# Patient Record
Sex: Male | Born: 1955 | Race: Black or African American | Hispanic: No | Marital: Single | Smoking: Current every day smoker
Health system: Southern US, Community
[De-identification: ages and names within clinical notes are randomized; demographics above are authoritative.]

## PROBLEM LIST (undated history)

## (undated) DIAGNOSIS — I10 Essential (primary) hypertension: Secondary | ICD-10-CM

## (undated) DIAGNOSIS — I639 Cerebral infarction, unspecified: Secondary | ICD-10-CM

## (undated) DIAGNOSIS — F1010 Alcohol abuse, uncomplicated: Secondary | ICD-10-CM

---

## 2006-06-17 ENCOUNTER — Ambulatory Visit: Payer: Self-pay | Admitting: Family Medicine

## 2012-05-04 ENCOUNTER — Encounter (HOSPITAL_COMMUNITY): Payer: Self-pay | Admitting: *Deleted

## 2012-05-04 ENCOUNTER — Emergency Department (HOSPITAL_COMMUNITY)
Admission: EM | Admit: 2012-05-04 | Discharge: 2012-05-04 | Disposition: A | Discharge status: Home / Self Care | Payer: PRIVATE HEALTH INSURANCE | Attending: Emergency Medicine | Admitting: Emergency Medicine

## 2012-05-04 DIAGNOSIS — L723 Sebaceous cyst: Secondary | ICD-10-CM | POA: Insufficient documentation

## 2012-05-04 DIAGNOSIS — L0201 Cutaneous abscess of face: Secondary | ICD-10-CM | POA: Insufficient documentation

## 2012-05-04 DIAGNOSIS — F17200 Nicotine dependence, unspecified, uncomplicated: Secondary | ICD-10-CM | POA: Insufficient documentation

## 2012-05-04 DIAGNOSIS — L03211 Cellulitis of face: Secondary | ICD-10-CM | POA: Insufficient documentation

## 2012-05-04 MED ORDER — LIDOCAINE HCL (PF) 1 % IJ SOLN
INTRAMUSCULAR | Status: AC
  Administered 2012-05-04: 5 mL
  Filled 2012-05-04: qty 5

## 2012-05-04 NOTE — ED Provider Notes (Signed)
History     CSN: [id number]  Arrival date & time 05/04/12  1405   First MD Initiated Contact with Patient 05/04/12 1429      Chief Complaint  Patient presents with  . Abscess    (Consider location/radiation/quality/duration/timing/severity/associated sxs/prior treatment) HPI Comments: Pt notes swelling anterior to L ear over the past several days.  He also has a swollen area behind the L ear which he has had for many months.  The history is provided by the patient. No language interpreter was used.    History reviewed. No pertinent past medical history.  History reviewed. No pertinent past surgical history.  History reviewed. No pertinent family history.  History  Substance Use Topics  . Smoking status: Current Every Day Smoker  . Smokeless tobacco: Not on file  . Alcohol Use: Yes      Review of Systems  Constitutional: Negative for fever and chills.  HENT: Negative for facial swelling.   Skin:       Abscess   All other systems reviewed and are negative.    Allergies  Review of patient's allergies indicates no known allergies.  Home Medications  No current outpatient prescriptions on file.  BP 189/93  Pulse 71  Temp 98.1 F (36.7 C) (Oral)  Resp 18  Ht 6' 1.5" (1.867 m)  Wt 160 lb (72.576 kg)  BMI 20.82 kg/m2  SpO2 100%  Physical Exam  Nursing note and vitals reviewed. Constitutional: He is oriented to person, place, and time. He appears well-developed and well-nourished.  HENT:  Head: Normocephalic and atraumatic.    Eyes: EOM are normal.  Neck: Normal range of motion.  Cardiovascular: Normal rate, regular rhythm and intact distal pulses.   Pulmonary/Chest: Effort normal. No respiratory distress.  Abdominal: Soft. He exhibits no distension. There is no tenderness.  Musculoskeletal: Normal range of motion.  Neurological: He is alert and oriented to person, place, and time.  Skin: Skin is warm and dry.  Psychiatric: He has a normal mood and  affect. Judgment normal.    ED Course  INCISION AND DRAINAGE Date/Time: 05/04/2012 3:40 PM Performed by: Evalina Field Authorized by: Evalina Field Consent: Verbal consent obtained. Written consent not obtained. Risks and benefits: risks, benefits and alternatives were discussed Consent given by: patient Patient understanding: patient states understanding of the procedure being performed Patient consent: the patient's understanding of the procedure matches consent given Site marked: the operative site was not marked Imaging studies: imaging studies not available Patient identity confirmed: verbally with patient Time out: Immediately prior to procedure a "time out" was called to verify the correct patient, procedure, equipment, support staff and site/side marked as required. Type: abscess Anesthesia: local infiltration Local anesthetic: lidocaine 1% without epinephrine Anesthetic total: 4 ml Patient sedated: no Scalpel size: 11 Needle gauge: 25 ga. Incision type: single straight Complexity: simple Drainage: purulent Drainage amount: copious Wound treatment: wound left open Patient tolerance: Patient tolerated the procedure well with no immediate complications. Comments: Procedure above was for the pre-auricular abscess. The post-auricular lesion when incised revealed a large quantity of sebaceous material.   (including critical care time)  Labs Reviewed - No data to display No results found.   1. Facial abscess   2. Sebaceous cyst       MDM  Have elected to not pack either lesion with iodoform or prescribe abx.  Pt told to apply  Heat several times daily.  Return to the ED if any problems.  Evalina Field, [place] 05/04/12 [phone]

## 2012-05-04 NOTE — ED Notes (Signed)
Abscess  In front of lt ear and to scalp.

## 2012-05-04 NOTE — ED Provider Notes (Signed)
Medical screening examination/treatment/procedure(s) were performed by non-physician practitioner and as supervising physician I was immediately available for consultation/collaboration.   Shelda Jakes, MD 05/04/12 [phone]

## 2018-03-17 ENCOUNTER — Inpatient Hospital Stay (HOSPITAL_COMMUNITY): Payer: PRIVATE HEALTH INSURANCE

## 2018-03-17 ENCOUNTER — Other Ambulatory Visit: Payer: Self-pay

## 2018-03-17 ENCOUNTER — Inpatient Hospital Stay (HOSPITAL_COMMUNITY)
Admission: EM | Admit: 2018-03-17 | Discharge: 2018-05-04 | DRG: 064 | Disposition: A | Discharge status: Skilled Nursing Facility | Payer: PRIVATE HEALTH INSURANCE | Attending: Internal Medicine | Admitting: Internal Medicine

## 2018-03-17 ENCOUNTER — Encounter (HOSPITAL_COMMUNITY): Payer: Self-pay | Admitting: *Deleted

## 2018-03-17 ENCOUNTER — Emergency Department (HOSPITAL_COMMUNITY): Payer: PRIVATE HEALTH INSURANCE

## 2018-03-17 DIAGNOSIS — R000 Tachycardia, unspecified: Secondary | ICD-10-CM | POA: Diagnosis not present

## 2018-03-17 DIAGNOSIS — G936 Cerebral edema: Secondary | ICD-10-CM | POA: Diagnosis present

## 2018-03-17 DIAGNOSIS — Z6820 Body mass index (BMI) 20.0-20.9, adult: Secondary | ICD-10-CM | POA: Diagnosis not present

## 2018-03-17 DIAGNOSIS — Z720 Tobacco use: Secondary | ICD-10-CM | POA: Diagnosis not present

## 2018-03-17 DIAGNOSIS — F1020 Alcohol dependence, uncomplicated: Secondary | ICD-10-CM | POA: Diagnosis present

## 2018-03-17 DIAGNOSIS — F17210 Nicotine dependence, cigarettes, uncomplicated: Secondary | ICD-10-CM | POA: Diagnosis present

## 2018-03-17 DIAGNOSIS — Y900 Blood alcohol level of less than 20 mg/100 ml: Secondary | ICD-10-CM | POA: Diagnosis present

## 2018-03-17 DIAGNOSIS — G92 Toxic encephalopathy: Secondary | ICD-10-CM | POA: Diagnosis not present

## 2018-03-17 DIAGNOSIS — F1010 Alcohol abuse, uncomplicated: Secondary | ICD-10-CM | POA: Diagnosis present

## 2018-03-17 DIAGNOSIS — Z23 Encounter for immunization: Secondary | ICD-10-CM | POA: Diagnosis not present

## 2018-03-17 DIAGNOSIS — I63511 Cerebral infarction due to unspecified occlusion or stenosis of right middle cerebral artery: Principal | ICD-10-CM

## 2018-03-17 DIAGNOSIS — I639 Cerebral infarction, unspecified: Secondary | ICD-10-CM | POA: Diagnosis present

## 2018-03-17 DIAGNOSIS — R4701 Aphasia: Secondary | ICD-10-CM | POA: Diagnosis present

## 2018-03-17 DIAGNOSIS — T424X5A Adverse effect of benzodiazepines, initial encounter: Secondary | ICD-10-CM | POA: Diagnosis not present

## 2018-03-17 DIAGNOSIS — Y92230 Patient room in hospital as the place of occurrence of the external cause: Secondary | ICD-10-CM | POA: Diagnosis not present

## 2018-03-17 DIAGNOSIS — F1290 Cannabis use, unspecified, uncomplicated: Secondary | ICD-10-CM | POA: Diagnosis present

## 2018-03-17 DIAGNOSIS — R29713 NIHSS score 13: Secondary | ICD-10-CM | POA: Diagnosis present

## 2018-03-17 DIAGNOSIS — E440 Moderate protein-calorie malnutrition: Secondary | ICD-10-CM

## 2018-03-17 DIAGNOSIS — D72829 Elevated white blood cell count, unspecified: Secondary | ICD-10-CM | POA: Diagnosis present

## 2018-03-17 DIAGNOSIS — G9341 Metabolic encephalopathy: Secondary | ICD-10-CM

## 2018-03-17 DIAGNOSIS — I674 Hypertensive encephalopathy: Secondary | ICD-10-CM | POA: Diagnosis present

## 2018-03-17 DIAGNOSIS — R040 Epistaxis: Secondary | ICD-10-CM | POA: Diagnosis not present

## 2018-03-17 DIAGNOSIS — I10 Essential (primary) hypertension: Secondary | ICD-10-CM | POA: Diagnosis present

## 2018-03-17 DIAGNOSIS — E43 Unspecified severe protein-calorie malnutrition: Secondary | ICD-10-CM | POA: Diagnosis present

## 2018-03-17 DIAGNOSIS — E512 Wernicke's encephalopathy: Secondary | ICD-10-CM

## 2018-03-17 HISTORY — DX: Alcohol abuse, uncomplicated: F10.10

## 2018-03-17 HISTORY — DX: Cerebral infarction, unspecified: I63.9

## 2018-03-17 HISTORY — DX: Essential (primary) hypertension: I10

## 2018-03-17 LAB — COMPREHENSIVE METABOLIC PANEL
ALT: 21 U/L (ref 0–44)
AST: 33 U/L (ref 15–41)
Albumin: 4.5 g/dL (ref 3.5–5.0)
Alkaline Phosphatase: 93 U/L (ref 38–126)
Anion gap: 12 (ref 5–15)
BILIRUBIN TOTAL: 1.1 mg/dL (ref 0.3–1.2)
BUN: 17 mg/dL (ref 6–20)
CHLORIDE: 103 mmol/L (ref 98–111)
CO2: 23 mmol/L (ref 22–32)
CREATININE: 0.97 mg/dL (ref 0.61–1.24)
Calcium: 9.6 mg/dL (ref 8.9–10.3)
Glucose, Bld: 101 mg/dL — ABNORMAL HIGH (ref 70–99)
POTASSIUM: 3.7 mmol/L (ref 3.5–5.1)
Sodium: 138 mmol/L (ref 135–145)
Total Protein: 8.4 g/dL — ABNORMAL HIGH (ref 6.5–8.1)

## 2018-03-17 LAB — CBC WITH DIFFERENTIAL/PLATELET
Basophils Absolute: 0.1 10*3/uL (ref 0.0–0.1)
Basophils Relative: 0 %
EOS PCT: 0 %
Eosinophils Absolute: 0 10*3/uL (ref 0.0–0.7)
HEMATOCRIT: 49.4 % (ref 39.0–52.0)
Hemoglobin: 16.5 g/dL (ref 13.0–17.0)
LYMPHS PCT: 17 %
Lymphs Abs: 2 10*3/uL (ref 0.7–4.0)
MCH: 32 pg (ref 26.0–34.0)
MCHC: 33.4 g/dL (ref 30.0–36.0)
MCV: 95.9 fL (ref 78.0–100.0)
MONO ABS: 1.1 10*3/uL — AB (ref 0.1–1.0)
MONOS PCT: 9 %
NEUTROS ABS: 8.7 10*3/uL — AB (ref 1.7–7.7)
Neutrophils Relative %: 74 %
PLATELETS: 273 10*3/uL (ref 150–400)
RBC: 5.15 MIL/uL (ref 4.22–5.81)
RDW: 12.9 % (ref 11.5–15.5)
WBC: 11.9 10*3/uL — ABNORMAL HIGH (ref 4.0–10.5)

## 2018-03-17 LAB — AMMONIA: AMMONIA: 13 umol/L (ref 9–35)

## 2018-03-17 LAB — ETHANOL

## 2018-03-17 LAB — MAGNESIUM: Magnesium: 2.2 mg/dL (ref 1.7–2.4)

## 2018-03-17 LAB — PHOSPHORUS: PHOSPHORUS: 3.9 mg/dL (ref 2.5–4.6)

## 2018-03-17 MED ORDER — LORAZEPAM 2 MG/ML IJ SOLN
0.5000 mg | Freq: Once | INTRAMUSCULAR | Status: AC
  Administered 2018-03-17: 0.5 mg via INTRAVENOUS
  Filled 2018-03-17: qty 1

## 2018-03-17 MED ORDER — ACETAMINOPHEN 650 MG RE SUPP: 650.0000 mg | RECTAL | Status: DC | PRN

## 2018-03-17 MED ORDER — HEPARIN SODIUM (PORCINE) 5000 UNIT/ML IJ SOLN: 5000.0000 [IU] | Freq: Three times a day (TID) | INTRAMUSCULAR | Status: DC

## 2018-03-17 MED ORDER — THIAMINE HCL 100 MG/ML IJ SOLN
500.0000 mg | Freq: Once | INTRAVENOUS | Status: AC
  Administered 2018-03-17: 500 mg via INTRAVENOUS
  Filled 2018-03-17: qty 5

## 2018-03-17 MED ORDER — THIAMINE HCL 100 MG/ML IJ SOLN
INTRAMUSCULAR | Status: AC
  Filled 2018-03-17: qty 6

## 2018-03-17 MED ORDER — STROKE: EARLY STAGES OF RECOVERY BOOK
Freq: Once | Status: AC
  Administered 2018-03-17: 22:00:00
  Filled 2018-03-17 (×2): qty 1

## 2018-03-17 MED ORDER — ACETAMINOPHEN 160 MG/5ML PO SOLN: 650.0000 mg | ORAL | Status: DC | PRN

## 2018-03-17 MED ORDER — FOLIC ACID 1 MG PO TABS
1.0000 mg | ORAL_TABLET | Freq: Every day | ORAL | Status: DC
  Administered 2018-03-18 – 2018-05-04 (×48): 1 mg via ORAL
  Filled 2018-03-17 (×48): qty 1

## 2018-03-17 MED ORDER — THIAMINE HCL 100 MG/ML IJ SOLN
100.0000 mg | Freq: Once | INTRAMUSCULAR | Status: AC
Start: 2018-03-17 — End: 2018-03-17
  Administered 2018-03-17: 100 mg via INTRAVENOUS
  Filled 2018-03-17: qty 2

## 2018-03-17 MED ORDER — SENNOSIDES-DOCUSATE SODIUM 8.6-50 MG PO TABS
1.0000 | ORAL_TABLET | Freq: Every evening | ORAL | Status: DC | PRN
  Filled 2018-03-17: qty 1

## 2018-03-17 MED ORDER — HEPARIN SODIUM (PORCINE) 5000 UNIT/ML IJ SOLN
5000.0000 [IU] | Freq: Three times a day (TID) | INTRAMUSCULAR | Status: DC
  Administered 2018-03-17 – 2018-04-23 (×93): 5000 [IU] via SUBCUTANEOUS
  Filled 2018-03-17 (×92): qty 1

## 2018-03-17 MED ORDER — LORAZEPAM 1 MG PO TABS: 1.0000 mg | ORAL_TABLET | Freq: Four times a day (QID) | ORAL | Status: AC | PRN

## 2018-03-17 MED ORDER — ACETAMINOPHEN 325 MG PO TABS
650.0000 mg | ORAL_TABLET | ORAL | Status: DC | PRN
  Administered 2018-04-14: 650 mg via ORAL
  Filled 2018-03-17: qty 2

## 2018-03-17 MED ORDER — ADULT MULTIVITAMIN W/MINERALS CH
1.0000 | ORAL_TABLET | Freq: Every day | ORAL | Status: DC
  Administered 2018-03-18 – 2018-04-30 (×44): 1 via ORAL
  Filled 2018-03-17 (×44): qty 1

## 2018-03-17 MED ORDER — LORAZEPAM 2 MG/ML IJ SOLN
0.0000 mg | Freq: Four times a day (QID) | INTRAMUSCULAR | Status: DC
  Administered 2018-03-19: 2 mg via INTRAVENOUS
  Filled 2018-03-17: qty 1

## 2018-03-17 MED ORDER — ASPIRIN 300 MG RE SUPP
300.0000 mg | Freq: Once | RECTAL | Status: AC
  Administered 2018-03-17: 300 mg via RECTAL
  Filled 2018-03-17: qty 1

## 2018-03-17 MED ORDER — THIAMINE HCL 100 MG/ML IJ SOLN: 500.0000 mg | Freq: Once | INTRAMUSCULAR | Status: DC

## 2018-03-17 MED ORDER — VITAMIN B-1 100 MG PO TABS: 100.0000 mg | ORAL_TABLET | Freq: Once | ORAL | Status: DC

## 2018-03-17 MED ORDER — ONDANSETRON HCL 4 MG PO TABS: 4.0000 mg | ORAL_TABLET | Freq: Four times a day (QID) | ORAL | Status: DC | PRN

## 2018-03-17 MED ORDER — ONDANSETRON HCL 4 MG/2ML IJ SOLN: 4.0000 mg | Freq: Four times a day (QID) | INTRAMUSCULAR | Status: DC | PRN

## 2018-03-17 MED ORDER — LORAZEPAM 2 MG/ML IJ SOLN
0.0000 mg | Freq: Two times a day (BID) | INTRAMUSCULAR | Status: DC
  Filled 2018-03-17: qty 1

## 2018-03-17 MED ORDER — LORAZEPAM 2 MG/ML IJ SOLN
1.0000 mg | Freq: Four times a day (QID) | INTRAMUSCULAR | Status: AC | PRN
  Administered 2018-03-20: 1 mg via INTRAVENOUS
  Filled 2018-03-17: qty 1

## 2018-03-17 MED ORDER — POTASSIUM CHLORIDE IN NACL 20-0.9 MEQ/L-% IV SOLN
INTRAVENOUS | Status: DC
  Administered 2018-03-17 – 2018-03-21 (×9): via INTRAVENOUS

## 2018-03-17 NOTE — ED Notes (Signed)
Attempted to get urine sample by using urinal. Pt attempted to use urinal but instead urinated all over the floor. Pt unable to follow directions and getting very agitated.

## 2018-03-17 NOTE — ED Provider Notes (Addendum)
[facility] EMERGENCY DEPARTMENT Provider Note   CSN: [id number] Arrival date & time: 03/17/18  1623     History   Chief Complaint Chief Complaint  Patient presents with  . Altered Mental Status    HPI Adam Koch is a 62 y.o. male.  HPI Patient brought in by EMS for altered mental status.  He is unable to contribute to history.  Level 5 caveat applies.  Per EMS patient was found walking in the street in his underwear today.  He was agitated on scene.  History of alcohol use.      History reviewed. No pertinent past medical history.  There are no active problems to display for this patient.   History reviewed. No pertinent surgical history.      Home Medications    Prior to Admission medications   Not on File    Family History No family history on file.  Social History Social History   Tobacco Use  . Smoking status: Current Every Day Smoker    Types: Cigarettes  . Smokeless tobacco: Never Used  Substance Use Topics  . Alcohol use: Not Currently    Comment: pt denies, EMS reports friends say he drinks regularly  . Drug use: No     Allergies   Patient has no known allergies.   Review of Systems Review of Systems  Unable to perform ROS: Mental status change     Physical Exam Updated Vital Signs Ht 6\' 1"  (1.854 m)   Wt 72 kg   BMI 20.94 kg/m   Physical Exam  Constitutional: He appears well-developed and well-nourished. No distress.  HENT:  Head: Normocephalic and atraumatic.  Mouth/Throat: Oropharynx is clear and moist.  No obvious head trauma.  No intraoral trauma.  Eyes: Pupils are equal, round, and reactive to light. EOM are normal.  Neck: Normal range of motion. Neck supple.  No meningismus.  No posterior midline cervical tenderness to palpation.  Cardiovascular: Regular rhythm.  Tachycardia   Pulmonary/Chest: Effort normal and breath sounds normal. No stridor. No respiratory distress. He has no wheezes. He has no rales. He  exhibits no tenderness.  Abdominal: Soft. Bowel sounds are normal. There is no tenderness. There is no rebound and no guarding.  Musculoskeletal: Normal range of motion. He exhibits no edema or tenderness.  Neurological: He is alert.  Repetitive.  Oriented only to self.  5/5 motor in all extremities.  Sensation light touch grossly intact.  No tremor identified.  Skin: Skin is warm and dry. Capillary refill takes less than 2 seconds. No rash noted. No erythema.  Psychiatric:  Patient is confused and repetitive.  Mildly agitated but amenable to verbal consolation  Nursing note and vitals reviewed.    ED Treatments / Results  Labs (all labs ordered are listed, but only abnormal results are displayed) Labs Reviewed - No data to display  EKG None ED ECG REPORT   Date: 05/03/2018  Rate: 71  Rhythm: normal sinus rhythm  QRS Axis: normal  Intervals: normal  ST/T Wave abnormalities: nonspecific T wave changes  Conduction Disutrbances:none  Narrative Interpretation:   Old EKG Reviewed: none available  I have personally reviewed the EKG tracing and agree with the computerized printout as noted.  Radiology No results found.  Procedures Procedures (including critical care time)  Medications Ordered in ED Medications  LORazepam (ATIVAN) injection 0.5 mg (has no administration in time range)  thiamine (B-1) injection 100 mg (has no administration in time range)  Initial Impression / Assessment and Plan / ED Course  I have reviewed the triage vital signs and the nursing notes.  Pertinent labs & imaging results that were available during my care of the patient were reviewed by me and considered in my medical decision making (see chart for details).     Tachycardia and hypertension.  Question alcohol withdrawal versus acute intoxication.  Given history of prior alcohol use, also consider Warnicke Korsakoff.  Will give low-dose Ativan and thiamine while awaiting work-up  results.  Final Clinical Impressions(s) / ED Diagnoses   Final diagnoses:  None    ED Discharge Orders    None       Loren Racer, MD 03/20/18 1516    Loren Racer, MD 05/03/18 1756

## 2018-03-17 NOTE — ED Notes (Signed)
Patient transported to CT 

## 2018-03-17 NOTE — H&P (Signed)
History and Physical    Adam Koch ZOX:[id number] DOB: December 28, 1955 DOA: 03/17/2018  PCP: Patient, No Pcp Per   Patient coming from: Home.  I have personally briefly reviewed patient's old medical records in [facility]  Chief Complaint: AMS.  HPI: Adam Koch is a 62 y.o. male with medical history significant of alcohol abuse, tobacco use, hypertension who is coming to the emergency department due to being found by EMS and RDP wandering on the streets confused.  He is unable to provide further history.  A friend of the patient talked to EMS and stated that he drinks regularly.  He smokes daily an unknown amount of cigarettes.  He is only oriented to name and does not follow most simple commands.  ED Course: Initial vital signs pulse 66, respirations 16, blood pressure 197/114 mmHg and O2 sat 99%.  He was given half milligram lorazepam IVP x1 and thiamine 100 mg IVP x1.  His white count was 11.9 with 74% neutrophils, 17% lymphocytes and 9% monocytes.  Hemoglobin 16.5 g/dL.  Platelets are 273.  His CMP shows a glucose of 101 mg/dL and total protein of 8.4 g/dL.  All other values are within normal limits.  Ammonia level was 13 mol/L.  Alcohol level was less than 10 mg/dL.  Imaging: CT head without contrast shows a moderate area of edema/acute to subacute infarct in the right parietal lobe without evidence for hemorrhage or midline shift.  There is also suspected chronic infarct within the left parietal lobe.  There is chronic infarcts within the bilateral basal ganglia with small vessel ischemic changes of the white matter.  Please see images and full radiology report for further detail.  Review of Systems: Unable to obtain.   Past Medical History:  Diagnosis Date  . Alcohol abuse   . Hypertension   . Stroke [facility])     History reviewed. No pertinent surgical history.   reports that he has been smoking cigarettes. He has never used smokeless tobacco. He reports that he drank  alcohol. He reports that he does not use drugs.  No Known Allergies  History reviewed. Unable to obtain from the patient.   Prior to Admission medications   Not on File    Physical Exam: Vitals:   03/17/18 1700 03/17/18 1745 03/17/18 1800 03/17/18 1830  BP: (!) 193/107 (!) 184/97 (!) 167/93 (!) 180/101  Pulse: 66 60 63 61  Resp: (!) 22 18 20 14   SpO2: 99% 100% 100% 100%  Weight:      Height:        Constitutional: NAD, calm, comfortable Eyes: PERRL, lids and conjunctivae normal ENMT: Mucous membranes are moist. Posterior pharynx clear of any exudate or lesions. Neck: normal, supple, no masses, no thyromegaly Respiratory: clear to auscultation bilaterally, no wheezing, no crackles. Normal respiratory effort. No accessory muscle use.  Cardiovascular: Regular rate and rhythm, no murmurs / rubs / gallops. No extremity edema. 2+ pedal pulses. No carotid bruits.  Abdomen: Soft, no tenderness, no masses palpated. No hepatosplenomegaly. Bowel sounds positive.  Musculoskeletal: no clubbing / cyanosis.  Good ROM, no contractures. Normal muscle tone.  Skin: no rashes, lesions, ulcers  Neurologic: Seems to be grossly nonfocal.  However the patient does not follow simple commands or assist in his neurological examination in any way.  Unable to fully evaluate. Psychiatric:  Alert and oriented x 1.  Disoriented to place, time, date and situation.  Labs on Admission: I have personally reviewed following labs and imaging studies  CBC: Recent Labs  Lab 03/17/18 1738  WBC 11.9*  NEUTROABS 8.7*  HGB 16.5  HCT 49.4  MCV 95.9  PLT 273   Basic Metabolic Panel: Recent Labs  Lab 03/17/18 1738  NA 138  K 3.7  CL 103  CO2 23  GLUCOSE 101*  BUN 17  CREATININE 0.97  CALCIUM 9.6   GFR: Estimated Creatinine Clearance: 82.5 mL/min (by C-G formula based on SCr of 0.97 mg/dL). Liver Function Tests: Recent Labs  Lab 03/17/18 1738  AST 33  ALT 21  ALKPHOS 93  BILITOT 1.1  PROT 8.4*    ALBUMIN 4.5   No results for input(s): LIPASE, AMYLASE in the last 168 hours. Recent Labs  Lab 03/17/18 1738  AMMONIA 13   Coagulation Profile: No results for input(s): INR, PROTIME in the last 168 hours. Cardiac Enzymes: No results for input(s): CKTOTAL, CKMB, CKMBINDEX, TROPONINI in the last 168 hours. BNP (last 3 results) No results for input(s): PROBNP in the last 8760 hours. HbA1C: No results for input(s): HGBA1C in the last 72 hours. CBG: No results for input(s): GLUCAP in the last 168 hours. Lipid Profile: No results for input(s): CHOL, HDL, LDLCALC, TRIG, CHOLHDL, LDLDIRECT in the last 72 hours. Thyroid Function Tests: No results for input(s): TSH, T4TOTAL, FREET4, T3FREE, THYROIDAB in the last 72 hours. Anemia Panel: No results for input(s): VITAMINB12, FOLATE, FERRITIN, TIBC, IRON, RETICCTPCT in the last 72 hours. Urine analysis: No results found for: COLORURINE, APPEARANCEUR, LABSPEC, PHURINE, GLUCOSEU, HGBUR, BILIRUBINUR, KETONESUR, PROTEINUR, UROBILINOGEN, NITRITE, LEUKOCYTESUR  Radiological Exams on Admission: Ct Head Wo Contrast  Result Date: 03/17/2018 CLINICAL DATA:  Altered LOC EXAM: CT HEAD WITHOUT CONTRAST TECHNIQUE: Contiguous axial images were obtained from the base of the skull through the vertex without intravenous contrast. COMPARISON:  None. FINDINGS: Brain: No hemorrhage or intracranial mass. Low-density edema within the right parietal lobe consistent with infarct. Low attenuation within the left parietal lobe with associated mild volume loss, suggesting prior infarct. Chronic appearing infarcts within the bilateral basal ganglia. Moderate small vessel ischemic change of the white matter. Nonenlarged ventricles. Vascular: No hyperdense vessels.  Carotid vascular calcification Skull: No fracture Sinuses/Orbits: Old appearing blowout fractures of the medial walls of both orbits. Old appearing nasal bone fracture. No acute abnormality Other: None IMPRESSION: 1.  Moderate area of edema/acute to subacute infarct in the right parietal lobe without evidence for hemorrhage or midline shift. 2. Suspected chronic infarct within the left parietal lobe. Chronic infarcts within the bilateral basal ganglia with small vessel ischemic changes of the white matter. Electronically Signed   By: Jasmine Pang M.D.   On: 03/17/2018 19:26    EKG: Independently reviewed.  Vent. rate 71 BPM PR interval * ms QRS duration 80 ms QT/QTc 401/436 ms P-R-T axes 75 73 64 Sinus rhythm Consider left ventricular hypertrophy Anterior Q waves, possibly due to LVH ST elevation, consider inferior injury  Assessment/Plan Principal Problem:   Acute CVA (cerebrovascular accident) (HCC) Observation/Telemetry. Frequent neuro checks. PT/OT/SLP evaluation. Check fasting lipids. Check hemoglobin A1c. Check carotid Dopplers and echocardiogram. Check MR MRA to brain. Antiplatelet therapy (aspirin p.o. or PR close). Consult neurology in a.m.  Active Problems:   Alcohol abuse Responded only to 0.5 mg of lorazepam and IVP. Given mental status change will give high-dose thiamine. Continue daily thiamine supplementation. Magnesium sulfate, multivitamin and folic acid. CIWA protocol ordered in case the patient develops DTs.    Hypertension Not on medical therapy with no. In any case, allow permissive hypertension  due to CVA.    Tobacco use Nicotine replacement therapy as needed.    Leukocytosis Afebrile at this time. Awaiting for UA results. Does not seem to have infectious process. Follow WBC in the morning.   DVT prophylaxis: Heparin SQ. Code Status: Full code. Family Communication: None at bedside. Disposition Plan: Admit for CVA work-up. Consults called: Routine neurology consult. Admission status: Inpatient/telemetry.   Bobette Mo MD [facility] Pager [phone].  If 7PM-7AM, please contact night-coverage [email or web address] Password  [facility]  03/17/2018, 8:48 PM

## 2018-03-17 NOTE — ED Triage Notes (Signed)
Pt was found walking out in the streets in his underwear today. PD and EMS was called to assess pt. Pt is alert and oriented to name only. Pt unaware of his own DOB. EMS reports pt drinks alcohol but unsure if he has drank any today. Pt denies drinking any alcohol. Pt agitated.

## 2018-03-17 NOTE — ED Notes (Signed)
Pt refuses to follow commands but is strong in all extremities. Pt hooked back up to monitor. Pt is alert to name only

## 2018-03-18 ENCOUNTER — Inpatient Hospital Stay (HOSPITAL_COMMUNITY): Payer: PRIVATE HEALTH INSURANCE

## 2018-03-18 ENCOUNTER — Other Ambulatory Visit: Payer: Self-pay

## 2018-03-18 ENCOUNTER — Encounter (HOSPITAL_COMMUNITY): Payer: Self-pay | Admitting: *Deleted

## 2018-03-18 DIAGNOSIS — I63511 Cerebral infarction due to unspecified occlusion or stenosis of right middle cerebral artery: Secondary | ICD-10-CM

## 2018-03-18 DIAGNOSIS — G9341 Metabolic encephalopathy: Secondary | ICD-10-CM

## 2018-03-18 DIAGNOSIS — I10 Essential (primary) hypertension: Secondary | ICD-10-CM

## 2018-03-18 DIAGNOSIS — Z720 Tobacco use: Secondary | ICD-10-CM

## 2018-03-18 DIAGNOSIS — F1010 Alcohol abuse, uncomplicated: Secondary | ICD-10-CM

## 2018-03-18 LAB — URINALYSIS, COMPLETE (UACMP) WITH MICROSCOPIC
BILIRUBIN URINE: NEGATIVE
Bacteria, UA: NONE SEEN
GLUCOSE, UA: NEGATIVE mg/dL
KETONES UR: 20 mg/dL — AB
LEUKOCYTES UA: NEGATIVE
NITRITE: NEGATIVE
PH: 5 (ref 5.0–8.0)
PROTEIN: NEGATIVE mg/dL
Specific Gravity, Urine: 1.018 (ref 1.005–1.030)

## 2018-03-18 LAB — BASIC METABOLIC PANEL
Anion gap: 11 (ref 5–15)
BUN: 16 mg/dL (ref 6–20)
CALCIUM: 9 mg/dL (ref 8.9–10.3)
CO2: 22 mmol/L (ref 22–32)
CREATININE: 0.78 mg/dL (ref 0.61–1.24)
Chloride: 106 mmol/L (ref 98–111)
GFR calc Af Amer: >60 mL/min (ref >60)
GLUCOSE: 67 mg/dL — AB (ref 70–99)
Potassium: 3.9 mmol/L (ref 3.5–5.1)
SODIUM: 139 mmol/L (ref 135–145)

## 2018-03-18 LAB — CBC
HCT: 48.2 % (ref 39.0–52.0)
Hemoglobin: 15.9 g/dL (ref 13.0–17.0)
MCH: 31.7 pg (ref 26.0–34.0)
MCHC: 33 g/dL (ref 30.0–36.0)
MCV: 96.2 fL (ref 78.0–100.0)
PLATELETS: 251 10*3/uL (ref 150–400)
RBC: 5.01 MIL/uL (ref 4.22–5.81)
RDW: 12.9 % (ref 11.5–15.5)
WBC: 9.7 10*3/uL (ref 4.0–10.5)

## 2018-03-18 LAB — LIPID PANEL
Cholesterol: 115 mg/dL (ref 0–200)
HDL: 40 mg/dL — AB (ref >40)
LDL Cholesterol: 68 mg/dL (ref 0–99)
TRIGLYCERIDES: 34 mg/dL (ref <150)
Total CHOL/HDL Ratio: 2.9 RATIO
VLDL: 7 mg/dL (ref 0–40)

## 2018-03-18 LAB — RAPID URINE DRUG SCREEN, HOSP PERFORMED
Amphetamines: NOT DETECTED
BARBITURATES: NOT DETECTED
Benzodiazepines: NOT DETECTED
COCAINE: NOT DETECTED
Opiates: NOT DETECTED
Tetrahydrocannabinol: POSITIVE — AB

## 2018-03-18 LAB — FOLATE: Folate: 11.5 ng/mL (ref >5.9)

## 2018-03-18 LAB — ECHOCARDIOGRAM COMPLETE
HEIGHTINCHES: 73 in
WEIGHTICAEL: 2105.83 [oz_av]

## 2018-03-18 LAB — HEMOGLOBIN A1C
Hgb A1c MFr Bld: 5.4 % (ref 4.8–5.6)
MEAN PLASMA GLUCOSE: 108.28 mg/dL

## 2018-03-18 LAB — TSH: TSH: 1.094 u[IU]/mL (ref 0.350–4.500)

## 2018-03-18 LAB — AMMONIA: AMMONIA: 21 umol/L (ref 9–35)

## 2018-03-18 LAB — VITAMIN B12: Vitamin B-12: 589 pg/mL (ref 180–914)

## 2018-03-18 MED ORDER — HYDRALAZINE HCL 20 MG/ML IJ SOLN: 10.0000 mg | Freq: Four times a day (QID) | INTRAMUSCULAR | Status: DC | PRN

## 2018-03-18 MED ORDER — ASPIRIN 325 MG PO TABS
325.0000 mg | ORAL_TABLET | Freq: Every day | ORAL | Status: DC
  Administered 2018-03-18 – 2018-05-04 (×48): 325 mg via ORAL
  Filled 2018-03-18 (×48): qty 1

## 2018-03-18 MED ORDER — THIAMINE HCL 100 MG/ML IJ SOLN
500.0000 mg | Freq: Three times a day (TID) | INTRAVENOUS | Status: AC
  Administered 2018-03-18 – 2018-03-20 (×6): 500 mg via INTRAVENOUS
  Filled 2018-03-18 (×6): qty 5

## 2018-03-18 MED ORDER — INFLUENZA VAC SPLIT QUAD 0.5 ML IM SUSY
0.5000 mL | PREFILLED_SYRINGE | INTRAMUSCULAR | Status: DC
  Filled 2018-03-18: qty 0.5

## 2018-03-18 MED ORDER — PNEUMOCOCCAL VAC POLYVALENT 25 MCG/0.5ML IJ INJ
0.5000 mL | INJECTION | INTRAMUSCULAR | Status: AC
  Administered 2018-03-19: 0.5 mL via INTRAMUSCULAR
  Filled 2018-03-18: qty 0.5

## 2018-03-18 NOTE — Progress Notes (Signed)
Able to assess grips on patient at this time, strength is even and moderate to strong at times.  Patient remains confused.  Have asked patient if he needed to use the BR multiple times - still does not answer appropriately.  Bladder scan reavles approx 50 cc although may be limited due to patient movement.  Pulse also running from high 40's - 50's.  MD paged and made aware - awaiting further instruction.

## 2018-03-18 NOTE — Progress Notes (Addendum)
OT Cancellation Note  Patient Details Name: Adam Koch MRN: [id number] DOB: 1956-06-14   Cancelled Treatment:    Reason Eval/Treat Not Completed: Other (comment). Attempted evaluation with PT this am. Pt limited due to cognition and very limited interaction with rehab staff. OT observing pt during PT evaluation, pt unable to provide history stating "I don't even know." PT able to glean some hx from family member on phone. Pt appears to have strength WFL, unable to formally assess due to limited ability to follow directions. OT also notes pt appears to have left neglect OR left visual field cut as he had difficulty with following directions when asked to go left and did not appear to see the pillow positioned at his left. Will continue to follow and evaluate when appropriate.    Ezra Sites, OTR/L  [phone] 03/18/2018, 11:56 AM

## 2018-03-18 NOTE — Progress Notes (Signed)
Held morning meds because patient failed swallow test. Waiting for further evaluation. Patient has not been cooperative with NIH evaluation. He seems confused at times, but alert +O x 4 at other times. Patient stated on the phone to his aunt that we are not feeding him, telling him anything, or helping him. I offered to speak with his aunt on the phone and he would not allow me to. Will continue to monitor and evaluate patient.

## 2018-03-18 NOTE — Plan of Care (Signed)
  Problem: Acute Rehab PT Goals(only PT should resolve) Goal: Pt Will Go Supine/Side To Sit Outcome: Progressing Flowsheets (Taken 03/18/2018 1223) Pt will go Supine/Side to Sit: with modified independence Goal: Patient Will Transfer Sit To/From Stand Outcome: Progressing Flowsheets (Taken 03/18/2018 1223) Patient will transfer sit to/from stand: with supervision Goal: Pt Will Transfer Bed To Chair/Chair To Bed Outcome: Progressing Flowsheets (Taken 03/18/2018 1223) Pt will Transfer Bed to Chair/Chair to Bed: with supervision Goal: Pt Will Ambulate Outcome: Progressing Flowsheets (Taken 03/18/2018 1223) Pt will Ambulate: with min guard assist; 50 feet; with cane   12:23 PM, 03/18/18 Ocie Bob, MPT Physical Therapist with [facility] [phone] office [phone] mobile phone

## 2018-03-18 NOTE — Progress Notes (Signed)
Patient's family members arrived. They said he was very confused and stated that he "wasn't the guy they knew". I asked them to encourage him as I performed his NIH assessment. He was very confused and did not follow instructions very well.

## 2018-03-18 NOTE — Progress Notes (Signed)
Patient sleeping on stomach, asked him to turn over on left side. He did once but rolled back on to stomach after a few seconds. I explained to patient here to do echo. Patient stated," It doesn't matter." And wouldn't turn on side for echo. Will attempt later.

## 2018-03-18 NOTE — Plan of Care (Signed)
Unable to educate patient due to confusion

## 2018-03-18 NOTE — Progress Notes (Signed)
Unable to complete neuro assessments and NIH.  Patient uncooperative and confused.  Does not answer appropriately and will not follow directions.  Attempted to assess grip/strength but patient refuses, but is able to turn and use strength to reposition self or pull away.  Can not answer when asked year or birthday - will answer yes or no to questions that are open ended.  Repeatedly states "I don't know" as well.  Patient lays to L side and keeps pulling covers over self.   Will continue to monitor.

## 2018-03-18 NOTE — Evaluation (Signed)
Physical Therapy Evaluation Patient Details Name: Adam Koch MRN: [id number] DOB: 03/10/1956 Today's Date: 03/18/2018   History of Present Illness   Adam Koch is a 62 y.o. male with medical history significant of alcohol abuse, tobacco use, hypertension who is coming to the emergency department due to being found by EMS and RDP wandering on the streets confused.  He is unable to provide further history.  A friend of the patient talked to EMS and stated that he drinks regularly.  He smokes daily an unknown amount of cigarettes.  He is only oriented to name and does not follow most simple commands.    Clinical Impression  Patient presents alert and requires encouragement to participate with therapy requires frequent repeated verbal/tactile cueing to complete functional tasks due to mild lethargy and confusion, fair/poor carryover for following most instructions, but able to ambulate into hallway with slow labored movement, had episode of stopping and standing requiring verbal/tactile cue to redirect to functional task, put back to bed after therapy with bed alarm activated.  Patient will benefit from continued physical therapy in hospital and recommended venue below to increase strength, balance, endurance for safe ADLs and gait.    Follow Up Recommendations SNF;Supervision/Assistance - 24 hour;Supervision for mobility/OOB    Equipment Recommendations  Cane    Recommendations for Other Services       Precautions / Restrictions Precautions Precautions: Fall Restrictions Weight Bearing Restrictions: No      Mobility  Bed Mobility Overal bed mobility: Needs Assistance Bed Mobility: Supine to Sit;Sit to Supine     Supine to sit: Min assist Sit to supine: Min assist   General bed mobility comments: slow labored movement with constant verbal/tactile cueing  Transfers Overall transfer level: Needs assistance Equipment used: 1 person hand held assist;None Transfers: Sit to/from  [employer] Sit to Stand: Min assist Stand pivot transfers: Min assist       General transfer comment: slow labored movement with repeated verbal/tactile cueing  Ambulation/Gait Ambulation/Gait assistance: Min assist Gait Distance (Feet): 30 Feet Assistive device: None;1 person hand held assist Gait Pattern/deviations: Decreased step length - right;Decreased step length - left;Decreased stride length Gait velocity: slow   General Gait Details: demonstrates slow slightly labored cadence with frequent leaning on side rails in hallway for support, frequent stopping and standing for up to 2-3 minutes requiring much redirection to complete tasks  Stairs            Wheelchair Mobility    Modified Rankin (Stroke Patients Only)       Balance Overall balance assessment: Needs assistance Sitting-balance support: Feet supported;No upper extremity supported Sitting balance-Leahy Scale: Good     Standing balance support: During functional activity;Single extremity supported Standing balance-Leahy Scale: Fair Standing balance comment: frequent leaning on side rail in hallway                             Pertinent Vitals/Pain Pain Assessment: No/denies pain    Home Living Family/patient expects to be discharged to:: Private residence Living Arrangements: Other relatives Available Help at Discharge: Family   Home Access: Level entry     Home Layout: One level Home Equipment: None Additional Comments: information obtained from patient's aunt on phone    Prior Function Level of Independence: Independent         Comments: community ambulator, does not drive     Hand Dominance        Extremity/Trunk Assessment  Upper Extremity Assessment Upper Extremity Assessment: Defer to OT evaluation    Lower Extremity Assessment Lower Extremity Assessment: Generalized weakness    Cervical / Trunk Assessment Cervical / Trunk Assessment:  Normal  Communication   Communication: Other (comment)(poor memory, poor carryover for following instructions)  Cognition Arousal/Alertness: Awake/alert Behavior During Therapy: Flat affect;Impulsive Overall Cognitive Status: Impaired/Different from baseline Area of Impairment: Orientation                 Orientation Level: Person             General Comments: Patient confused and frequent states he does not know what's going on      General Comments      Exercises     Assessment/Plan    PT Assessment Patient needs continued PT services  PT Problem List Decreased strength;Decreased activity tolerance;Decreased balance;Decreased mobility       PT Treatment Interventions Gait training;Stair training;Functional mobility training;Therapeutic activities;Therapeutic exercise;Patient/family education    PT Goals (Current goals can be found in the Care Plan section)  Acute Rehab PT Goals Patient Stated Goal: return home PT Goal Formulation: With patient Time For Goal Achievement: 04/01/18 Potential to Achieve Goals: Good    Frequency 7X/week   Barriers to discharge        Co-evaluation               AM-PAC PT "6 Clicks" Daily Activity  Outcome Measure Difficulty turning over in bed (including adjusting bedclothes, sheets and blankets)?: A Little Difficulty moving from lying on back to sitting on the side of the bed? : A Little Difficulty sitting down on and standing up from a chair with arms (e.g., wheelchair, bedside commode, etc,.)?: A Little Help needed moving to and from a bed to chair (including a wheelchair)?: A Little Help needed walking in hospital room?: A Little Help needed climbing 3-5 steps with a railing? : A Lot 6 Click Score: 17    End of Session Equipment Utilized During Treatment: Gait belt Activity Tolerance: Patient tolerated treatment well;Patient limited by fatigue Patient left: in bed;with call bell/phone within reach;with bed  alarm set Nurse Communication: Mobility status PT Visit Diagnosis: Unsteadiness on feet (R26.81);Other abnormalities of gait and mobility (R26.89);Muscle weakness (generalized) (M62.81)    Time: 1610-9604 PT Time Calculation (min) (ACUTE ONLY): 26 min   Charges:   PT Evaluation $PT Eval Moderate Complexity: 1 Mod PT Treatments $Therapeutic Activity: 23-37 mins        12:17 PM, 03/18/18 Ocie Bob, MPT Physical Therapist with [facility] [phone] office [phone] mobile phone

## 2018-03-18 NOTE — Plan of Care (Signed)
  Problem: SLP Cognition Goals Goal: Patient will demonstrate attention to functional Description Patient will demonstrate attention to functional task with Flowsheets (Taken 03/18/2018 1540) Patient will demonstrate _____ attention to functional task with ____: sustained; min assist Goal: Patient will utilize external memory aids Description Patient will utilize external memory aids to facilitate recall of information for improved safety with Flowsheets (Taken 03/18/2018 1540) Patient will utilize external memory aids to facilitate recall of ____ information for improved safety with ______: basic; mod assist   Problem: SLP Language Goals Goal: Patient will follow commands during a functional ADL with Flowsheets (Taken 03/18/2018 1540) Patient will follow ____ commands during a functional ADL with ____: 1 step; min assist   Thank you,  [name], CCC-SLP [phone]

## 2018-03-18 NOTE — Progress Notes (Signed)
Admission assessment unable to be completed at this time due to patients AMS

## 2018-03-18 NOTE — Progress Notes (Signed)
PROGRESS NOTE  Adam Koch ZOX:[id number] DOB: July 14, 1955 DOA: 03/17/2018 PCP: Patient, No Pcp Per  Brief History:  62 year old male with a history of alcohol abuse, tobacco use, hypertension presented with altered mental status.  Patient is unable to provide any history secondary to his encephalopathy.  Apparently, the patient was agitated and confused on the afternoon of 03/17/2018.  According to his girlfriend, the patient walked outside into the backyard in his underwear and began pulling out flowers.  Because of his agitation, she called the police who arrived and recommended EMS to bring the patient to the hospital.  According to the patient's girlfriend, the patient was " acting drunk" on 03/15/2018.  She states that she last saw him normal on 03/14/2018.  She states that he frequently goes out to drink alcohol after he gets paid, although she cannot elaborate how much and how often and the patient drinks.  However, she states that he has drank alcohol regularly for nearly 20 years.  The patient continues to smoke tobacco although the amount is unclear.  The been no reports of chest pain, shortness breath, vomiting, diarrhea, abdominal pain, chest pain.  In the emergency department, the patient was afebrile hemodynamically stable saturating 100% on room air.  He was only oriented to name.  Assessment/Plan: Acute metabolic encephalopathy -Multifactorial including Wernicke's encephalopathy, stroke, and possibly hypertensive encephalopathy -At baseline, the patient is alert and oriented x4 -Currently the patient is alert and oriented x1 -Serum B12 -TSH -Free T4 -Urinalysis -Folic acid -RPR -Ammonia -UDS  Acute right MCA infarct -Neurology Consult -PT/OT evaluation -Speech therapy eval -CT brain--Moderate area of edema/acute to subacute infarct in the right parietal lobe without evidence for hemorrhage or midline shift -MRI brain--acute infarct R-MCA territory with few other  smaller foci in the same territory; questionable infarct in the inferior cerebellum on the left -MRA brain--motion degraded, no hemodynamically significant stenosis -Carotid Duplex--negative for hemodynamically significant stenosis -Echo--pending -LDL--68 -HbA1C--pending -Antiplatelet--ASA 325 mg   Essential hypertension -Allowing for permissive hypertension -hydralazine prn SBP >220  Alcohol abuse -alcohol withdraw protocol  Tobacco abuse I have discussed tobacco cessation with the patient.  I have counseled the patient regarding the negative impacts of continued tobacco use including but not limited to lung cancer, COPD, and cardiovascular disease.  I have discussed alternatives to tobacco and modalities that may help facilitate tobacco cessation including but not limited to biofeedback, hypnosis, and medications.  Total time spent with tobacco counseling was 4 minutes.    Disposition Plan:   SNF in 1-2 days  Family Communication:   Cousin and niece updated at bedside 9/11  Consultants:  neurology  Code Status:  FULL   DVT Prophylaxis:  [place] Heparin    Procedures: As Listed in Progress Note Above  Antibiotics: None    Subjective: Patient is pleasantly confused at this time.  He denies any headache, chest pain, shortness breath, nausea, vomiting, diarrhea, abdominal pain.  There is no dysuria hematuria.  Objective: Vitals:   03/18/18 0607 03/18/18 0800 03/18/18 1000 03/18/18 1400  BP: (!) 175/93 (!) 150/84 (!) 160/94 (!) 209/83  Pulse: (!) 51 (!) 47 65 (!) 47  Resp: 16 18 18 18   Temp: 97.8 F (36.6 C) (!) 97.4 F (36.3 C) (!) 97.5 F (36.4 C) 97.7 F (36.5 C)  TempSrc: Oral Axillary Axillary Axillary  SpO2: 99% 100% 100% 100%  Weight:      Height:  Intake/Output Summary (Last 24 hours) at 03/18/2018 1530 Last data filed at 03/18/2018 1300 Gross per 24 hour  Intake 393.75 ml  Output 450 ml  Net -56.25 ml   Weight change:  Exam:   General:  Pt is  alert, follows commands appropriately, not in acute distress  HEENT: No icterus, No thrush, No neck mass, [place]/AT  Cardiovascular: RRR, S1/S2, no rubs, no gallops  Respiratory: CTA bilaterally, no wheezing, no crackles, no rhonchi  Abdomen: Soft/+BS, non tender, non distended, no guarding  Extremities: No edema, No lymphangitis, No petechiae, No rashes, no synovitis   Data Reviewed: I have personally reviewed following labs and imaging studies Basic Metabolic Panel: Recent Labs  Lab 03/17/18 1738 03/18/18 0503  NA 138 139  K 3.7 3.9  CL 103 106  CO2 23 22  GLUCOSE 101* 67*  BUN 17 16  CREATININE 0.97 0.78  CALCIUM 9.6 9.0  MG 2.2  --   PHOS 3.9  --    Liver Function Tests: Recent Labs  Lab 03/17/18 1738  AST 33  ALT 21  ALKPHOS 93  BILITOT 1.1  PROT 8.4*  ALBUMIN 4.5   No results for input(s): LIPASE, AMYLASE in the last 168 hours. Recent Labs  Lab 03/17/18 1738  AMMONIA 13   Coagulation Profile: No results for input(s): INR, PROTIME in the last 168 hours. CBC: Recent Labs  Lab 03/17/18 1738 03/18/18 0503  WBC 11.9* 9.7  NEUTROABS 8.7*  --   HGB 16.5 15.9  HCT 49.4 48.2  MCV 95.9 96.2  PLT 273 251   Cardiac Enzymes: No results for input(s): CKTOTAL, CKMB, CKMBINDEX, TROPONINI in the last 168 hours. BNP: Invalid input(s): POCBNP CBG: No results for input(s): GLUCAP in the last 168 hours. HbA1C: No results for input(s): HGBA1C in the last 72 hours. Urine analysis: No results found for: COLORURINE, APPEARANCEUR, LABSPEC, PHURINE, GLUCOSEU, HGBUR, BILIRUBINUR, KETONESUR, PROTEINUR, UROBILINOGEN, NITRITE, LEUKOCYTESUR Sepsis Labs: @LABRCNTIP (procalcitonin:4,lacticidven:4) )No results found for this or any previous visit (from the past 240 hour(s)).   Scheduled Meds: . folic acid  1 mg Oral Daily  . heparin  5,000 Units Subcutaneous Q8H  . [START ON 03/19/2018] Influenza vac split quadrivalent PF  0.5 mL Intramuscular Tomorrow-1000  . LORazepam   0-4 mg Intravenous Q6H   Followed by  . [START ON 03/19/2018] LORazepam  0-4 mg Intravenous Q12H  . multivitamin with minerals  1 tablet Oral Daily  . [START ON 03/19/2018] pneumococcal 23 valent vaccine  0.5 mL Intramuscular Tomorrow-1000  . thiamine  100 mg Oral Once   Continuous Infusions: . 0.9 % NaCl with KCl 20 mEq / L 125 mL/hr at 03/18/18 1156    Procedures/Studies: Ct Head Wo Contrast  Result Date: 03/17/2018 CLINICAL DATA:  Altered LOC EXAM: CT HEAD WITHOUT CONTRAST TECHNIQUE: Contiguous axial images were obtained from the base of the skull through the vertex without intravenous contrast. COMPARISON:  None. FINDINGS: Brain: No hemorrhage or intracranial mass. Low-density edema within the right parietal lobe consistent with infarct. Low attenuation within the left parietal lobe with associated mild volume loss, suggesting prior infarct. Chronic appearing infarcts within the bilateral basal ganglia. Moderate small vessel ischemic change of the white matter. Nonenlarged ventricles. Vascular: No hyperdense vessels.  Carotid vascular calcification Skull: No fracture Sinuses/Orbits: Old appearing blowout fractures of the medial walls of both orbits. Old appearing nasal bone fracture. No acute abnormality Other: None IMPRESSION: 1. Moderate area of edema/acute to subacute infarct in the right parietal lobe without evidence  for hemorrhage or midline shift. 2. Suspected chronic infarct within the left parietal lobe. Chronic infarcts within the bilateral basal ganglia with small vessel ischemic changes of the white matter. Electronically Signed   By: Jasmine Pang M.D.   On: 03/17/2018 19:26   Mr Brain Wo Contrast  Result Date: 03/18/2018 CLINICAL DATA:  Mental status changes.  Confusion. EXAM: MRI HEAD WITHOUT CONTRAST MRA HEAD WITHOUT CONTRAST TECHNIQUE: Multiplanar, multiecho pulse sequences of the brain and surrounding structures were obtained without intravenous contrast. Angiographic images of  the head were obtained using MRA technique without contrast. COMPARISON:  CT 03/17/2018 FINDINGS: MRI HEAD FINDINGS Brain: The study is markedly degraded by motion. There is a 6-7 cm region of acute infarction in the right middle cerebral artery territory affecting the posterior temporal lobe and parietal lobe. Few smaller patchy areas of acute infarction anterior to that at the frontoparietal junction region. Mild swelling but no sign of hemorrhage. Old basal ganglia infarctions bilaterally. No sign of hydrocephalus I think there may also be acute infarction in the inferior cerebellum on the left, though this is less certain because of motion artifact. Vascular: No vascular information on this exam. Skull and upper cervical spine: No abnormality seen. Sinuses/Orbits: Negative Other: None MRA HEAD FINDINGS Marked motion degradation. Both internal carotid arteries are patent through the skull base. There is at least some flow in the middle cerebral artery branches and in the anterior cerebral arteries. Beyond that, no information is available regarding the anterior circulation. Both vertebral arteries are patent to the basilar. The basilar artery is patent. Posterior circulation branch vessels are not visible due to motion. IMPRESSION: Severely motion degraded exam. 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 Mild swelling but no evidence of hemorrhage on these limited images. Question small region of acute infarction in the inferior cerebellum on the left as well. If this is present, this would certainly elevate the likelihood of embolic disease from the heart or ascending aorta. If not a real finding, most likely source the embolus would be the right carotid bifurcation. Electronically Signed   By: Paulina Fusi M.D.   On: 03/18/2018 07:57   US Carotid Bilateral (at [facility] And [facility] Only)  Result Date:  03/18/2018 CLINICAL DATA:  Acute stroke, confusion.  Previous tobacco abuse. EXAM: BILATERAL CAROTID DUPLEX ULTRASOUND TECHNIQUE: Wallace Cullens scale imaging, color Doppler and duplex ultrasound were performed of bilateral carotid and vertebral arteries in the neck. COMPARISON:  None. FINDINGS: Criteria: Quantification of carotid stenosis is based on velocity parameters that correlate the residual internal carotid diameter with NASCET-based stenosis levels, using the diameter of the distal internal carotid lumen as the denominator for stenosis measurement. The following velocity measurements were obtained: RIGHT ICA: 51/14 cm/sec CCA: 54/8 cm/sec SYSTOLIC ICA/CCA RATIO:  0.9 ECA: 43 cm/sec LEFT ICA: 63/14 cm/sec CCA: 44/6 cm/sec SYSTOLIC ICA/CCA RATIO:  1.4 ECA: 42 cm/sec RIGHT CAROTID ARTERY: Calcified plaque in the bulb extending to the ICA origin. No high-grade stenosis. Normal waveforms and color Doppler signal. RIGHT VERTEBRAL ARTERY:  Normal flow direction and waveform. LEFT CAROTID ARTERY: Eccentric calcified plaque in the bulb. No high-grade stenosis. Normal waveforms and color Doppler signal. LEFT VERTEBRAL ARTERY:  Normal flow direction and waveform. IMPRESSION: 1. Bilateral carotid bifurcation plaque resulting in less than 50% diameter ICA stenosis. 2.  Antegrade bilateral vertebral arterial flow. Electronically Signed   By: Ronald Pippins.D.  On: 03/18/2018 11:14   Dg Chest Port 1 View  Result Date: 03/17/2018 CLINICAL DATA:  Found wandering the streets confused, question stroke, hypertension, smoker EXAM: PORTABLE CHEST 1 VIEW COMPARISON:  Portable exam 2102 hours without priors for comparison FINDINGS: Normal heart size, mediastinal contours, and pulmonary vascularity. Lungs clear. No pleural effusion or pneumothorax. No acute osseous findings. IMPRESSION: No acute abnormalities. Electronically Signed   By: Ulyses Southward M.D.   On: 03/17/2018 21:25   Mr Maxine Glenn Head/brain OJ Cm  Result Date:  03/18/2018 CLINICAL DATA:  Mental status changes.  Confusion. EXAM: MRI HEAD WITHOUT CONTRAST MRA HEAD WITHOUT CONTRAST TECHNIQUE: Multiplanar, multiecho pulse sequences of the brain and surrounding structures were obtained without intravenous contrast. Angiographic images of the head were obtained using MRA technique without contrast. COMPARISON:  CT 03/17/2018 FINDINGS: MRI HEAD FINDINGS Brain: The study is markedly degraded by motion. There is a 6-7 cm region of acute infarction in the right middle cerebral artery territory affecting the posterior temporal lobe and parietal lobe. Few smaller patchy areas of acute infarction anterior to that at the frontoparietal junction region. Mild swelling but no sign of hemorrhage. Old basal ganglia infarctions bilaterally. No sign of hydrocephalus I think there may also be acute infarction in the inferior cerebellum on the left, though this is less certain because of motion artifact. Vascular: No vascular information on this exam. Skull and upper cervical spine: No abnormality seen. Sinuses/Orbits: Negative Other: None MRA HEAD FINDINGS Marked motion degradation. Both internal carotid arteries are patent through the skull base. There is at least some flow in the middle cerebral artery branches and in the anterior cerebral arteries. Beyond that, no information is available regarding the anterior circulation. Both vertebral arteries are patent to the basilar. The basilar artery is patent. Posterior circulation branch vessels are not visible due to motion. IMPRESSION: Severely motion degraded exam. 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 Mild swelling but no evidence of hemorrhage on these limited images. Question small region of acute infarction in the inferior cerebellum on the left as well. If this is present, this would certainly elevate the likelihood of embolic  disease from the heart or ascending aorta. If not a real finding, most likely source the embolus would be the right carotid bifurcation. Electronically Signed   By: Paulina Fusi M.D.   On: 03/18/2018 07:57    Catarina Hartshorn, DO  [facility] Pager [phone]  If 7PM-7AM, please contact night-coverage [email or web address] Password [email or web address] 03/18/2018, 3:30 PM   LOS: 1 day

## 2018-03-18 NOTE — Evaluation (Signed)
Speech Language Pathology Evaluation Patient Details Name: Adam Koch MRN: [id number] DOB: [birth year] Today's Date: 03/18/2018 Time: 4098-1191 SLP Time Calculation (min) (ACUTE ONLY): 22 min  Problem List:  Patient Active Problem List   Diagnosis Date Noted  . Acute CVA (cerebrovascular accident) (HCC) 03/17/2018  . Alcohol abuse 03/17/2018  . Hypertension 03/17/2018  . Tobacco use 03/17/2018  . Leukocytosis 03/17/2018   Past Medical History:  Past Medical History:  Diagnosis Date  . Alcohol abuse   . Hypertension   . Stroke [facility])    Past Surgical History: History reviewed. No pertinent surgical history. HPI:  Adam Koch a 62 y.o.malewith medical history significant of alcohol abuse, tobacco use, hypertension who is coming to the emergency department due to being found by EMS and RDP wandering on the streets confused. He is unable to provide further history. A friend of the patient talked to EMS and stated that he drinks regularly. He smokes daily an unknown amount of cigarettes. He is only oriented to name and does not follow most simple commands. Severely motion degraded exam. 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 Mild swelling but no evidence of hemorrhage on these limited images. Question small region of acute infarction in the inferior cerebellum on the left as well. Pt failed RN swallow screen and RN requested BSE.   Assessment / Plan / Recommendation Clinical Impression  Pt presents with moderate cognitive linguistic deficits characterized by impairments in orientation, memory, attention, proprioception, and problems solving which negatively impacts functional safety and ability to follow commands. Pt was able to recall that he watched a [employer] the other day and that they lost (this is true). He was unable to state the date and when he was  told that it was 9/11, he was unable to state the significance initially. He then stated that his mother lives in [place] and was there when "it happened". Pt also able to tell me that he likes baseball and that his aunt roots for the "[place] [employer]" (this also true as she was present in room). Pt tilted his head sharply to the right and had difficulty making eye contact despite repeated requests. He was able to move head to midline with tactile cue.   Recommend continued SLP services to maximize recovery, increase safety, increase independence with cognitive linguistic skills, and decrease burden of care. SLP will follow during acute stay, however SNF is recommended.      SLP Assessment  SLP Recommendation/Assessment: Patient needs continued Speech Lanaguage Pathology Services SLP Visit Diagnosis: Cognitive communication deficit (R41.841);Attention and concentration deficit Attention and concentration deficit following: Cerebral infarction    Follow Up Recommendations  Skilled Nursing facility    Frequency and Duration min 2x/week  1 week      SLP Evaluation Cognition  Overall Cognitive Status: Impaired/Different from baseline Arousal/Alertness: Awake/alert Orientation Level: Oriented to person;Disoriented to place;Disoriented to time;Disoriented to situation Attention: Sustained Sustained Attention: Impaired Sustained Attention Impairment: Verbal basic Memory: Impaired Memory Impairment: Storage deficit Awareness: Impaired Awareness Impairment: Intellectual impairment Problem Solving: Impaired Problem Solving Impairment: Verbal basic Executive Function: Decision Making;Self Monitoring;Reasoning Reasoning: Impaired Reasoning Impairment: Verbal basic Decision Making: Impaired Decision Making Impairment: Verbal basic Self Monitoring: Impaired Self Monitoring Impairment: Verbal basic Behaviors: Impulsive;Perseveration Safety/Judgment: Impaired       Comprehension  Auditory  Comprehension Overall Auditory Comprehension: Impaired Yes/No Questions: Impaired Basic Biographical Questions: 26-50% accurate Commands: Impaired  One Step Basic Commands: 25-49% accurate Two Step Basic Commands: 0-24% accurate Conversation: Simple Interfering Components: Attention;Visual impairments;Working memory EffectiveTechniques: Repetition;Stressing Therapist, nutritional: Not tested Reading Comprehension Reading Status: Not tested    Expression Expression Primary Mode of Expression: Verbal Verbal Expression Overall Verbal Expression: Impaired Initiation: No impairment Automatic Speech: Name Level of Generative/Spontaneous Verbalization: Sentence Repetition: Impaired Level of Impairment: Word level Naming: Impairment Responsive: 51-75% accurate Confrontation: Impaired Convergent: 50-74% accurate Divergent: 25-49% accurate Pragmatics: Impairment Impairments: Abnormal affect;Eye contact;Monotone Interfering Components: Attention Effective Techniques: Phonemic cues Non-Verbal Means of Communication: Not applicable Written Expression Written Expression: Not tested   Oral / Motor  Oral Motor/Sensory Function Overall Oral Motor/Sensory Function: Within functional limits Motor Speech Overall Motor Speech: Appears within functional limits for tasks assessed Respiration: Within functional limits Phonation: Normal Resonance: Within functional limits Articulation: Within functional limitis Intelligibility: Intelligible Motor Planning: Witnin functional limits Motor Speech Errors: Not applicable   Thank you,  [name], CCC-SLP [phone]                     Andrews Tener 03/18/2018, 3:29 PM

## 2018-03-18 NOTE — Progress Notes (Signed)
SLP Cancellation Note  Patient Details Name: Adam Koch MRN: [id number] DOB: [birth year]   Cancelled treatment:       Reason Eval/Treat Not Completed: Patient at procedure or test/unavailable; Pt currently in room with echo tech. SLP spoke with RN who clarified that Pt failed RN swallow screen in ED, however order was never placed for BSE. SLP will check back.  Thank you,  [name], CCC-SLP [phone]    [name] 03/18/2018, 2:29 PM

## 2018-03-18 NOTE — Progress Notes (Signed)
*  PRELIMINARY RESULTS* Echocardiogram 2D Echocardiogram has been performed.  Jeryl Columbia RDCS 03/18/2018, 3:20 PM

## 2018-03-18 NOTE — Progress Notes (Signed)
Patient continues to be uncooperative and confused.  Answer all questions at this time with "No"  Patient resting well until staff enter room and get VS/neuro check, etc.  Alerted by tele that patient had 7 beats of VT.  Patient voicing no complaints.  BP remains high - MD allowing permissive HTN. Patient also moving a lot with VS, trying to move arm and pull at cuff. Will continue to monitor

## 2018-03-18 NOTE — Evaluation (Signed)
Clinical/Bedside Swallow Evaluation Patient Details  Name: Adam Koch MRN: [id number] Date of Birth: 27-Sep-1955  Today's Date: 03/18/2018 Time: SLP Start Time (ACUTE ONLY): 1430 SLP Stop Time (ACUTE ONLY): 1448 SLP Time Calculation (min) (ACUTE ONLY): 18 min  Past Medical History:  Past Medical History:  Diagnosis Date  . Alcohol abuse   . Hypertension   . Stroke [facility])    Past Surgical History: History reviewed. No pertinent surgical history. HPI:  Adam Koch a 62 y.o.malewith medical history significant of alcohol abuse, tobacco use, hypertension who is coming to the emergency department due to being found by EMS and RDP wandering on the streets confused. He is unable to provide further history. A friend of the patient talked to EMS and stated that he drinks regularly. He smokes daily an unknown amount of cigarettes. He is only oriented to name and does not follow most simple commands. Severely motion degraded exam. 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 Mild swelling but no evidence of hemorrhage on these limited images. Question small region of acute infarction in the inferior cerebellum on the left as well. Pt failed RN swallow screen and RN requested BSE.   Assessment / Plan / Recommendation Clinical Impression  Clinical swallow evaluation completed at bedside with family present. Pt required repetition and modeling for following commands, however oropharyngeal swallow appears to be WNL and Pt without overt signs/symptoms of aspiration while self feeding regular textures and thin liquids via cup and straw. Pt leans head to the right, however no gross facial asymmetry noted. He also appears to have some visual changes, however he denies visual changes and denies feeling dizzy. Recommend regular diet with thin liquids with intermittent supervision with meals due to  altered mental status. Pt will need f/u SLP services for for cognitive linguistic deficits- see SLE for more information.   SLP Visit Diagnosis: Dysphagia, unspecified (R13.10)    Aspiration Risk  No limitations    Diet Recommendation Regular   Liquid Administration via: Straw;Cup Medication Administration: Whole meds with liquid Supervision: Intermittent supervision to cue for compensatory strategies;Patient able to self feed Compensations: Minimize environmental distractions Postural Changes: Seated upright at 90 degrees;Remain upright for at least 30 minutes after po intake    Other  Recommendations Oral Care Recommendations: Oral care BID Other Recommendations: Clarify dietary restrictions   Follow up Recommendations Skilled Nursing facility(for cognitive linguistic deficits)      Frequency and Duration            Prognosis Prognosis for Safe Diet Advancement: Good Barriers to Reach Goals: Cognitive deficits      Swallow Study   General Date of Onset: 03/17/18 HPI: Adam Koch a 61 y.o.malewith medical history significant of alcohol abuse, tobacco use, hypertension who is coming to the emergency department due to being found by EMS and RDP wandering on the streets confused. He is unable to provide further history. A friend of the patient talked to EMS and stated that he drinks regularly. He smokes daily an unknown amount of cigarettes. He is only oriented to name and does not follow most simple commands. Severely motion degraded exam. 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 Mild swelling but no evidence of hemorrhage on these limited images. Question small region of acute infarction in the inferior cerebellum on the  left as well. Pt failed RN swallow screen and RN requested BSE. Type of Study: Bedside Swallow Evaluation Previous Swallow Assessment: None on  record Diet Prior to this Study: NPO Temperature Spikes Noted: No Respiratory Status: Room air History of Recent Intubation: No Behavior/Cognition: Alert;Cooperative;Requires cueing;Confused Oral Cavity Assessment: Within Functional Limits Oral Care Completed by SLP: No Oral Cavity - Dentition: Adequate natural dentition Vision: Functional for self-feeding Self-Feeding Abilities: Able to feed self;Needs set up Patient Positioning: Upright in bed Baseline Vocal Quality: Normal Volitional Cough: Strong Volitional Swallow: Able to elicit(some delays)    Oral/Motor/Sensory Function Overall Oral Motor/Sensory Function: Within functional limits   Ice Chips Ice chips: Within functional limits Presentation: Spoon   Thin Liquid Thin Liquid: Within functional limits Presentation: Cup;Self Fed;Straw    Nectar Thick Nectar Thick Liquid: Not tested   Honey Thick Honey Thick Liquid: Not tested   Puree Puree: Within functional limits Presentation: Self Fed;Spoon   Solid     Solid: Within functional limits Presentation: Self Fed     Thank you,  Adam Koch, CCC-SLP [phone]  Adam Koch 03/18/2018,3:16 PM

## 2018-03-19 ENCOUNTER — Inpatient Hospital Stay (HOSPITAL_COMMUNITY): Payer: PRIVATE HEALTH INSURANCE

## 2018-03-19 DIAGNOSIS — E43 Unspecified severe protein-calorie malnutrition: Secondary | ICD-10-CM

## 2018-03-19 LAB — HIV ANTIBODY (ROUTINE TESTING W REFLEX): HIV Screen 4th Generation wRfx: NONREACTIVE

## 2018-03-19 LAB — T4, FREE: Free T4: 1.17 ng/dL (ref 0.82–1.77)

## 2018-03-19 LAB — RPR: RPR: NONREACTIVE

## 2018-03-19 MED ORDER — INFLUENZA VAC SPLIT QUAD 0.5 ML IM SUSY
0.5000 mL | PREFILLED_SYRINGE | Freq: Once | INTRAMUSCULAR | Status: AC
  Administered 2018-03-19: 0.5 mL via INTRAMUSCULAR

## 2018-03-19 MED ORDER — ENSURE ENLIVE PO LIQD
237.0000 mL | Freq: Three times a day (TID) | ORAL | Status: DC
  Administered 2018-03-19 – 2018-04-09 (×46): 237 mL via ORAL

## 2018-03-19 MED ORDER — IOPAMIDOL (ISOVUE-370) INJECTION 76%
75.0000 mL | Freq: Once | INTRAVENOUS | Status: AC | PRN
  Administered 2018-03-19: 75 mL via INTRAVENOUS

## 2018-03-19 NOTE — Progress Notes (Signed)
Patient unable to perform tasks for neuro assessment. Patient has walked to bathroom with 2 assist and was placed back in bed. Lunch tray was put before patient but he does not touch it. Attempt made to assist patient with eating and he refuses. Patient unable to verbally express needs or concerns. Will continue to monitor.

## 2018-03-19 NOTE — Progress Notes (Signed)
Patient unable to follow commands to perform the neuro assessment. Patient is lethargic in bed and when asked a question, patient simply stares without verbal response.

## 2018-03-19 NOTE — Progress Notes (Signed)
PROGRESS NOTE  Adam Koch ZOX:[id number] DOB: [birth year] DOA: 03/17/2018 PCP: Patient, No Pcp Per  Brief History:  62 year old male with a history of alcohol abuse, tobacco use, hypertension presented with altered mental status.  Patient is unable to provide any history secondary to his encephalopathy.  Apparently, the patient was agitated and confused on the afternoon of 03/17/2018.  According to his girlfriend, the patient walked outside into the backyard in his underwear and began pulling out flowers.  Because of his agitation, she called the police who arrived and recommended EMS to bring the patient to the hospital.  According to the patient's girlfriend, the patient was " acting drunk" on 03/15/2018.  She states that she last saw him normal on 03/14/2018.  She states that he frequently goes out to drink alcohol after he gets paid, although she cannot elaborate how much and how often and the patient drinks.  However, she states that he has drank alcohol regularly for nearly 20 years.  The patient continues to smoke tobacco although the amount is unclear.  The been no reports of chest pain, shortness breath, vomiting, diarrhea, abdominal pain, chest pain.  In the emergency department, the patient was afebrile hemodynamically stable saturating 100% on room air.  He was only oriented to name.  Assessment/Plan: Acute metabolic encephalopathy -Multifactorial including Wernicke's encephalopathy, stroke, and possibly hypertensive encephalopathy and THC -At baseline, the patient is alert and oriented x4 -Currently the patient is alert and oriented x1 -Serum B12--589 -TSH--1.094 -Urinalysis--neg for pyuria -Folic acid--11.5 -RPR--neg -Ammonia--21 -UDS--+THC  Acute right MCA infarct -Neurology Consult appreciated-->30 day event monitor -PT/OT evaluation-->SNF -Speech therapy eval -CT brain--Moderate area of edema/acute to subacute infarct in the right parietal lobe without evidence for  hemorrhage or midline shift -MRI brain--acute infarct R-MCA territory with few other smaller foci in the same territory; questionable infarct in the inferior cerebellum on the left -MRA brain--motion degraded, no hemodynamically significant stenosis -Carotid Duplex--negative for hemodynamically significant stenosis -Echo--EF 55-60%, no WMA, no PFO noted -LDL--68 -HbA1C--5.4 -Antiplatelet--ASA 325 mg   Essential hypertension -Allowing for permissive hypertension in first 24 hours -hydralazine prn SBP >220 -start amlodipine  Alcohol abuse -alcohol withdraw protocol  Tobacco abuse I have discussed tobacco cessation with the patient.  I have counseled the patient regarding the negative impacts of continued tobacco use including but not limited to lung cancer, COPD, and cardiovascular disease.  I have discussed alternatives to tobacco and modalities that may help facilitate tobacco cessation including but not limited to biofeedback, hypnosis, and medications.  Total time spent with tobacco counseling was 4 minutes.  Severe malnutrition -start supplements    Disposition Plan:   SNF in 1-2 days  Family Communication:   Cousin and niece updated at bedside 9/11  Consultants:  neurology  Code Status:  FULL   DVT Prophylaxis:  [place] Heparin    Procedures: As Listed in Progress Note Above  Antibiotics: None    Subjective: Pt remains pleasantly confused with left sided neglect.  He denies cp, sob, headache, abd pain.  Remainder ROS unobtainable  Objective: Vitals:   03/19/18 0526 03/19/18 0800 03/19/18 0942 03/19/18 1200  BP: (!) 192/97 (!) 192/94 138/88 (!) 170/94  Pulse: (!) 46 (!) 49 61 (!) 55  Resp: 18 16  16   Temp: 97.8 F (36.6 C)  98.3 F (36.8 C) 98.5 F (36.9 C)  TempSrc: Oral  Axillary Oral  SpO2: 100% 100% 100% 100%  Weight:  Height:        Intake/Output Summary (Last 24 hours) at 03/19/2018 1535 Last data filed at 03/19/2018 1359 Gross per 24  hour  Intake 3619.27 ml  Output 1000 ml  Net 2619.27 ml   Weight change:  Exam:   General:  Pt is alert, follows commands appropriately, not in acute distress  HEENT: No icterus, No thrush, No neck mass, [place]/AT  Cardiovascular: RRR, S1/S2, no rubs, no gallops  Respiratory: bibasilar rales, no wheeze  Abdomen: Soft/+BS, non tender, non distended, no guarding  Extremities: No edema, No lymphangitis, No petechiae, No rashes, no synovitis   Data Reviewed: I have personally reviewed following labs and imaging studies Basic Metabolic Panel: Recent Labs  Lab 03/17/18 1738 03/18/18 0503  NA 138 139  K 3.7 3.9  CL 103 106  CO2 23 22  GLUCOSE 101* 67*  BUN 17 16  CREATININE 0.97 0.78  CALCIUM 9.6 9.0  MG 2.2  --   PHOS 3.9  --    Liver Function Tests: Recent Labs  Lab 03/17/18 1738  AST 33  ALT 21  ALKPHOS 93  BILITOT 1.1  PROT 8.4*  ALBUMIN 4.5   No results for input(s): LIPASE, AMYLASE in the last 168 hours. Recent Labs  Lab 03/17/18 1738 03/18/18 1556  AMMONIA 13 21   Coagulation Profile: No results for input(s): INR, PROTIME in the last 168 hours. CBC: Recent Labs  Lab 03/17/18 1738 03/18/18 0503  WBC 11.9* 9.7  NEUTROABS 8.7*  --   HGB 16.5 15.9  HCT 49.4 48.2  MCV 95.9 96.2  PLT 273 251   Cardiac Enzymes: No results for input(s): CKTOTAL, CKMB, CKMBINDEX, TROPONINI in the last 168 hours. BNP: Invalid input(s): POCBNP CBG: No results for input(s): GLUCAP in the last 168 hours. HbA1C: Recent Labs    03/18/18 0503  HGBA1C 5.4   Urine analysis:    Component Value Date/Time   COLORURINE YELLOW 03/18/2018 1910   APPEARANCEUR CLEAR 03/18/2018 1910   LABSPEC 1.018 03/18/2018 1910   PHURINE 5.0 03/18/2018 1910   GLUCOSEU NEGATIVE 03/18/2018 1910   HGBUR SMALL (A) 03/18/2018 1910   BILIRUBINUR NEGATIVE 03/18/2018 1910   KETONESUR 20 (A) 03/18/2018 1910   PROTEINUR NEGATIVE 03/18/2018 1910   NITRITE NEGATIVE 03/18/2018 1910    LEUKOCYTESUR NEGATIVE 03/18/2018 1910   Sepsis Labs: @LABRCNTIP (procalcitonin:4,lacticidven:4) )No results found for this or any previous visit (from the past 240 hour(s)).   Scheduled Meds: . aspirin  325 mg Oral Daily  . feeding supplement (ENSURE ENLIVE)  237 mL Oral TID BM  . folic acid  1 mg Oral Daily  . heparin  5,000 Units Subcutaneous Q8H  . LORazepam  0-4 mg Intravenous Q6H   Followed by  . LORazepam  0-4 mg Intravenous Q12H  . multivitamin with minerals  1 tablet Oral Daily   Continuous Infusions: . 0.9 % NaCl with KCl 20 mEq / L 125 mL/hr at 03/19/18 0600  . thiamine injection 500 mg (03/19/18 1416)    Procedures/Studies: Ct Head Wo Contrast  Result Date: 03/17/2018 CLINICAL DATA:  Altered LOC EXAM: CT HEAD WITHOUT CONTRAST TECHNIQUE: Contiguous axial images were obtained from the base of the skull through the vertex without intravenous contrast. COMPARISON:  None. FINDINGS: Brain: No hemorrhage or intracranial mass. Low-density edema within the right parietal lobe consistent with infarct. Low attenuation within the left parietal lobe with associated mild volume loss, suggesting prior infarct. Chronic appearing infarcts within the bilateral basal ganglia. Moderate small vessel  ischemic change of the white matter. Nonenlarged ventricles. Vascular: No hyperdense vessels.  Carotid vascular calcification Skull: No fracture Sinuses/Orbits: Old appearing blowout fractures of the medial walls of both orbits. Old appearing nasal bone fracture. No acute abnormality Other: None IMPRESSION: 1. Moderate area of edema/acute to subacute infarct in the right parietal lobe without evidence for hemorrhage or midline shift. 2. Suspected chronic infarct within the left parietal lobe. Chronic infarcts within the bilateral basal ganglia with small vessel ischemic changes of the white matter. Electronically Signed   By: Jasmine Pang M.D.   On: 03/17/2018 19:26   Mr Brain Wo Contrast  Result Date:  03/18/2018 CLINICAL DATA:  Mental status changes.  Confusion. EXAM: MRI HEAD WITHOUT CONTRAST MRA HEAD WITHOUT CONTRAST TECHNIQUE: Multiplanar, multiecho pulse sequences of the brain and surrounding structures were obtained without intravenous contrast. Angiographic images of the head were obtained using MRA technique without contrast. COMPARISON:  CT 03/17/2018 FINDINGS: MRI HEAD FINDINGS Brain: The study is markedly degraded by motion. There is a 6-7 cm region of acute infarction in the right middle cerebral artery territory affecting the posterior temporal lobe and parietal lobe. Few smaller patchy areas of acute infarction anterior to that at the frontoparietal junction region. Mild swelling but no sign of hemorrhage. Old basal ganglia infarctions bilaterally. No sign of hydrocephalus I think there may also be acute infarction in the inferior cerebellum on the left, though this is less certain because of motion artifact. Vascular: No vascular information on this exam. Skull and upper cervical spine: No abnormality seen. Sinuses/Orbits: Negative Other: None MRA HEAD FINDINGS Marked motion degradation. Both internal carotid arteries are patent through the skull base. There is at least some flow in the middle cerebral artery branches and in the anterior cerebral arteries. Beyond that, no information is available regarding the anterior circulation. Both vertebral arteries are patent to the basilar. The basilar artery is patent. Posterior circulation branch vessels are not visible due to motion. IMPRESSION: Severely motion degraded exam. 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 Mild swelling but no evidence of hemorrhage on these limited images. Question small region of acute infarction in the inferior cerebellum on the left as well. If this is present, this would certainly elevate the likelihood of embolic  disease from the heart or ascending aorta. If not a real finding, most likely source the embolus would be the right carotid bifurcation. Electronically Signed   By: Paulina Fusi M.D.   On: 03/18/2018 07:57   US Carotid Bilateral (at [facility] And [facility] Only)  Result Date: 03/18/2018 CLINICAL DATA:  Acute stroke, confusion.  Previous tobacco abuse. EXAM: BILATERAL CAROTID DUPLEX ULTRASOUND TECHNIQUE: Wallace Cullens scale imaging, color Doppler and duplex ultrasound were performed of bilateral carotid and vertebral arteries in the neck. COMPARISON:  None. FINDINGS: Criteria: Quantification of carotid stenosis is based on velocity parameters that correlate the residual internal carotid diameter with NASCET-based stenosis levels, using the diameter of the distal internal carotid lumen as the denominator for stenosis measurement. The following velocity measurements were obtained: RIGHT ICA: 51/14 cm/sec CCA: 54/8 cm/sec SYSTOLIC ICA/CCA RATIO:  0.9 ECA: 43 cm/sec LEFT ICA: 63/14 cm/sec CCA: 44/6 cm/sec SYSTOLIC ICA/CCA RATIO:  1.4 ECA: 42 cm/sec RIGHT CAROTID ARTERY: Calcified plaque in the bulb extending to the ICA origin. No high-grade stenosis. Normal waveforms and color Doppler signal. RIGHT VERTEBRAL ARTERY:  Normal flow direction and waveform. LEFT  CAROTID ARTERY: Eccentric calcified plaque in the bulb. No high-grade stenosis. Normal waveforms and color Doppler signal. LEFT VERTEBRAL ARTERY:  Normal flow direction and waveform. IMPRESSION: 1. Bilateral carotid bifurcation plaque resulting in less than 50% diameter ICA stenosis. 2.  Antegrade bilateral vertebral arterial flow. Electronically Signed   By: Corlis Leak M.D.   On: 03/18/2018 11:14   Dg Chest Port 1 View  Result Date: 03/17/2018 CLINICAL DATA:  Found wandering the streets confused, question stroke, hypertension, smoker EXAM: PORTABLE CHEST 1 VIEW COMPARISON:  Portable exam 2102 hours without priors for comparison FINDINGS: Normal heart size, mediastinal contours,  and pulmonary vascularity. Lungs clear. No pleural effusion or pneumothorax. No acute osseous findings. IMPRESSION: No acute abnormalities. Electronically Signed   By: Ulyses Southward M.D.   On: 03/17/2018 21:25   Mr Maxine Glenn Head/brain ZO Cm  Result Date: 03/18/2018 CLINICAL DATA:  Mental status changes.  Confusion. EXAM: MRI HEAD WITHOUT CONTRAST MRA HEAD WITHOUT CONTRAST TECHNIQUE: Multiplanar, multiecho pulse sequences of the brain and surrounding structures were obtained without intravenous contrast. Angiographic images of the head were obtained using MRA technique without contrast. COMPARISON:  CT 03/17/2018 FINDINGS: MRI HEAD FINDINGS Brain: The study is markedly degraded by motion. There is a 6-7 cm region of acute infarction in the right middle cerebral artery territory affecting the posterior temporal lobe and parietal lobe. Few smaller patchy areas of acute infarction anterior to that at the frontoparietal junction region. Mild swelling but no sign of hemorrhage. Old basal ganglia infarctions bilaterally. No sign of hydrocephalus I think there may also be acute infarction in the inferior cerebellum on the left, though this is less certain because of motion artifact. Vascular: No vascular information on this exam. Skull and upper cervical spine: No abnormality seen. Sinuses/Orbits: Negative Other: None MRA HEAD FINDINGS Marked motion degradation. Both internal carotid arteries are patent through the skull base. There is at least some flow in the middle cerebral artery branches and in the anterior cerebral arteries. Beyond that, no information is available regarding the anterior circulation. Both vertebral arteries are patent to the basilar. The basilar artery is patent. Posterior circulation branch vessels are not visible due to motion. IMPRESSION: Severely motion degraded exam. 6-7 cm region of acute infarction affecting the right middle cerebral artery territory, with a few other smaller foci of infarction in  that territory. Mild swelling but no evidence of hemorrhage on these limited images. Question small region of acute infarction in the inferior cerebellum on the left as well. If this is present, this would certainly elevate the likelihood of embolic disease from the heart or ascending aorta. If not a real finding, most likely source the embolus would be the right carotid bifurcation. Electronically Signed   By: Paulina Fusi M.D.   On: 03/18/2018 07:57    Catarina Hartshorn, DO  [facility] Pager [phone]  If 7PM-7AM, please contact night-coverage [email or web address] Password [email or web address] 03/19/2018, 3:35 PM   LOS: 2 days

## 2018-03-19 NOTE — Evaluation (Signed)
Occupational Therapy Evaluation Patient Details Name: Adam Koch MRN: [id number] DOB: 1955/11/15 Today's Date: 03/19/2018    History of Present Illness  Adam Koch is a 62 y.o. male with medical history significant of alcohol abuse, tobacco use, hypertension who is coming to the emergency department due to being found by EMS and RDP wandering on the streets confused.  He is unable to provide further history.  A friend of the patient talked to EMS and stated that he drinks regularly.  He smokes daily an unknown amount of cigarettes.  He is only oriented to name and does not follow most simple commands.   Clinical Impression   Pt received supine in bed, asking to use the restroom. Pt able to perform tasks with min guard, verbal cuing for sequencing. Pt with poor follow through with tasks, attempted to turn sink water off instead turning it on more no recognition of mistake even with verbal cuing. Pt able to follow modified visual testing with increased time and step by step cuing/sequencing. Pt appears to have signs of left homonymous hemianopsia during visual testing and during functional task completion. Pt is unsafe to return home at this time, recommend SNF on discharge to improve safety and independence in ADL completion.    Follow Up Recommendations  SNF;Supervision/Assistance - 24 hour    Equipment Recommendations  None recommended by OT       Precautions / Restrictions Precautions Precautions: Fall Restrictions Weight Bearing Restrictions: No      Mobility Bed Mobility Overal bed mobility: Needs Assistance Bed Mobility: Supine to Sit;Sit to Supine     Supine to sit: Supervision Sit to supine: Supervision      Transfers Overall transfer level: Needs assistance Equipment used: None Transfers: Sit to/from Stand Sit to Stand: Min guard                  ADL either performed or assessed with clinical judgement   ADL Overall ADL's : Needs assistance/impaired     Grooming: Wash/dry hands;Min guard;Standing Grooming Details (indicate cue type and reason): Pt requiring verbal cuing for sequencing. Tried to turn off the water but turned on instead, unable to follow cues to turn off                 Toilet Transfer: Teacher, early years/pre Details (indicate cue type and reason): Pt stood at commode, used bilateral hands during task Toileting- Architect and Hygiene: Min guard;Sit to/from stand       Functional mobility during ADLs: Min guard General ADL Comments: Pt requiring verbal cuing and sequencing for follow through      Vision Baseline Vision/History: No visual deficits(pt reports no glasses) Patient Visual Report: Other (comment)(pt unable to verbalize changes) Vision Assessment?: Yes Additional Comments: Pt unable follow formal vision testing however was able to focus and answer questions. Pt unable to answer any questions about position of OT fingers on left side. Pt appearing to demonstrate signs of left homonymous hemianopsia            Pertinent Vitals/Pain Pain Assessment: No/denies pain     Hand Dominance Right   Extremity/Trunk Assessment Upper Extremity Assessment Upper Extremity Assessment: LUE deficits/detail;Difficult to assess due to impaired cognition LUE Sensation: decreased light touch   Lower Extremity Assessment Lower Extremity Assessment: Defer to PT evaluation   Cervical / Trunk Assessment Cervical / Trunk Assessment: Normal   Communication Communication Communication: Other (comment)(poor memory/historian)   Cognition Arousal/Alertness: Awake/alert Behavior During Therapy: Flat  affect;Impulsive Overall Cognitive Status: Impaired/Different from baseline Area of Impairment: Orientation                 Orientation Level: Person             General Comments: Patient confused and frequent states he does not know what's going on              Home  Living Family/patient expects to be discharged to:: Private residence Living Arrangements: Other relatives Available Help at Discharge: Family;Friend(s) Type of Home: House Home Access: Level entry     Home Layout: One level               Home Equipment: None   Additional Comments: information from PT note       Prior Functioning/Environment Level of Independence: Independent        Comments: community ambulator, does not drive        OT Problem List: Decreased strength;Decreased activity tolerance;Impaired balance (sitting and/or standing);Impaired vision/perception;Decreased coordination;Decreased cognition;Decreased safety awareness;Decreased knowledge of use of DME or AE;Impaired sensation      OT Treatment/Interventions: Self-care/ADL training;Therapeutic exercise;Neuromuscular education;DME and/or AE instruction;Therapeutic activities;Patient/family education;Visual/perceptual remediation/compensation    OT Goals(Current goals can be found in the care plan section) Acute Rehab OT Goals Patient Stated Goal: none stated OT Goal Formulation: Patient unable to participate in goal setting Time For Goal Achievement: 04/02/18 Potential to Achieve Goals: Good  OT Frequency: Min 2X/week   Barriers to D/C: Decreased caregiver support                End of Session Equipment Utilized During Treatment: Gait belt Nurse Communication: Other (comment)(pt has been up to bathroom)  Activity Tolerance: Patient limited by fatigue Patient left: in bed;with call bell/phone within reach;with bed alarm set  OT Visit Diagnosis: Muscle weakness (generalized) (M62.81);Other symptoms and signs involving cognitive function                Time: [id number] OT Time Calculation (min): 13 min Charges:  OT General Charges $OT Visit: 1 Visit OT Evaluation $OT Eval Low Complexity: 1 Low   Ezra SitesLeslie Troxler, OTR/L  [phone] 03/19/2018, 7:58 AM

## 2018-03-19 NOTE — Plan of Care (Signed)
  Problem: Acute Rehab OT Goals (only OT should resolve) Goal: Pt. Will Perform Grooming Flowsheets (Taken 03/19/2018 0804) Pt Will Perform Grooming: with supervision; standing Note:  Min verbal cuing for sequencing Goal: Pt. Will Perform Upper Body Dressing Flowsheets (Taken 03/19/2018 0804) Pt Will Perform Upper Body Dressing: with modified independence; sitting Goal: Pt. Will Perform Lower Body Dressing Flowsheets (Taken 03/19/2018 0804) Pt Will Perform Lower Body Dressing: with supervision; sitting/lateral leans; sit to/from stand Note:  Min verbal cuing for sequencing Goal: Pt. Will Transfer To Toilet Flowsheets (Taken 03/19/2018 0804) Pt Will Transfer to Toilet: with supervision; ambulating; regular height toilet Note:  Min verbal cuing for sequencing Goal: Pt. Will Perform Toileting-Clothing Manipulation Flowsheets (Taken 03/19/2018 0804) Pt Will Perform Toileting - Clothing Manipulation and hygiene: with supervision; sitting/lateral leans; sit to/from stand Note:  Min verbal cuing for sequencing

## 2018-03-19 NOTE — Progress Notes (Signed)
    Request received for a 30-day cardiac event monitor in the setting of an acute CVA. Our office is aware and will assist in arranging this for the patient following hospital discharge.   Signed, Ellsworth LennoxBrittany M Pranathi Winfree, PA-C 03/19/2018, 11:43 AM Pager: [phone]

## 2018-03-19 NOTE — Progress Notes (Signed)
Physical Therapy Treatment Patient Details Name: Adam Koch MRN: [id number] DOB: Jul 05, 1956 Today's Date: 03/19/2018    History of Present Illness  Adam Koch is a 62 y.o. male with medical history significant of alcohol abuse, tobacco use, hypertension who is coming to the emergency department due to being found by EMS and RDP wandering on the streets confused.  He is unable to provide further history.  A friend of the patient talked to EMS and stated that he drinks regularly.  He smokes daily an unknown amount of cigarettes.  He is only oriented to name and does not follow most simple commands.    PT Comments    Patient demonstrates increased endurance/distance for gait training, but has difficulty turning to the left and frequent drifting to the left possibly due to left sided neglect.  Patient able to ambulate in hallway with fair/good return for using SPC with mostly 3 point gait pattern.  Patient continues to demonstrate delayed response to directions especially when asked to attempt functional tasks going to the left side, limited for sitting up in chair and requested to go back to bed after sitting up for approximately 5 minutes.  Patient will benefit from continued physical therapy in hospital and recommended venue below to increase strength, balance, endurance for safe ADLs and gait.   Follow Up Recommendations  SNF;Supervision/Assistance - 24 hour;Supervision for mobility/OOB     Equipment Recommendations  Cane    Recommendations for Other Services       Precautions / Restrictions Precautions Precautions: Fall Restrictions Weight Bearing Restrictions: No    Mobility  Bed Mobility Overal bed mobility: Needs Assistance Bed Mobility: Supine to Sit;Sit to Supine     Supine to sit: Supervision Sit to supine: Supervision   General bed mobility comments: labored movement  Transfers Overall transfer level: Needs assistance Equipment used: Straight cane Transfers:  Sit to/from Stand;Stand Pivot Transfers Sit to Stand: Min guard Stand pivot transfers: Min guard       General transfer comment: labored movement   Ambulation/Gait Ambulation/Gait assistance: Min guard Gait Distance (Feet): 100 Feet Assistive device: Straight cane Gait Pattern/deviations: Decreased step length - right;Decreased step length - left;Decreased stride length;Step-to pattern Gait velocity: decreased   General Gait Details: slow slightly labored cadence with mostly 3 point gait pattern, no loss of balance, but frequent drifts to the left possibly due to left sided neglect   Stairs             Wheelchair Mobility    Modified Rankin (Stroke Patients Only)       Balance Overall balance assessment: Needs assistance Sitting-balance support: Feet supported;No upper extremity supported Sitting balance-Leahy Scale: Good     Standing balance support: During functional activity;No upper extremity supported Standing balance-Leahy Scale: Fair Standing balance comment: frequent leaning on nearby objects for support, used SPC for safety                            Cognition Arousal/Alertness: Awake/alert Behavior During Therapy: WFL for tasks assessed/performed Overall Cognitive Status: Impaired/Different from baseline Area of Impairment: Attention;Orientation;Safety/judgement;Awareness                 Orientation Level: Person Current Attention Level: Selective     Safety/Judgement: Decreased awareness of safety;Decreased awareness of deficits     General Comments: tends to neglect left side      Exercises      General Comments  Pertinent Vitals/Pain Pain Assessment: No/denies pain    Home Living                      Prior Function            PT Goals (current goals can now be found in the care plan section) Acute Rehab PT Goals Patient Stated Goal: return home PT Goal Formulation: With patient Time For Goal  Achievement: 04/01/18 Potential to Achieve Goals: Good Progress towards PT goals: Progressing toward goals    Frequency    7X/week      PT Plan Current plan remains appropriate    Co-evaluation              AM-PAC PT "6 Clicks" Daily Activity  Outcome Measure  Difficulty turning over in bed (including adjusting bedclothes, sheets and blankets)?: None Difficulty moving from lying on back to sitting on the side of the bed? : None Difficulty sitting down on and standing up from a chair with arms (e.g., wheelchair, bedside commode, etc,.)?: A Little Help needed moving to and from a bed to chair (including a wheelchair)?: A Little Help needed walking in hospital room?: A Little Help needed climbing 3-5 steps with a railing? : A Lot 6 Click Score: 19    End of Session Equipment Utilized During Treatment: Gait belt Activity Tolerance: Patient tolerated treatment well;Patient limited by fatigue Patient left: in bed;with call bell/phone within reach;with bed alarm set Nurse Communication: Mobility status PT Visit Diagnosis: Unsteadiness on feet (R26.81);Other abnormalities of gait and mobility (R26.89);Muscle weakness (generalized) (M62.81)     Time: [id number] PT Time Calculation (min) (ACUTE ONLY): 23 min  Charges:  $Therapeutic Activity: 23-37 mins                     4:03 PM, 03/19/18 Ocie BobJames Doxie Augenstein, MPT Physical Therapist with [facility] 336 [phone] office [phone] mobile phone

## 2018-03-19 NOTE — Plan of Care (Signed)
Patient continues to progress medically, but not progressing with knowledge of disease process and self care. Unable to assess patients comprehension of information given d/t disease process

## 2018-03-19 NOTE — Progress Notes (Signed)
Patient was able to hold chicken legs, cup of tea, and brownie in right hand to feed himself. Patient was unable to us fork nor spoon.

## 2018-03-19 NOTE — Progress Notes (Signed)
Initial Nutrition Assessment  DOCUMENTATION CODES:   Severe malnutrition in context of social or environmental circumstances, Underweight  INTERVENTION:   - Continue MVI with minerals daily  - Ensure Enlive po TID, each supplement provides 350 kcal and 20 grams of protein  NUTRITION DIAGNOSIS:   Severe Malnutrition related to social / environmental circumstances (EtOH abuse) as evidenced by moderate fat depletion, severe fat depletion, moderate muscle depletion, severe muscle depletion.  GOAL:   Patient will meet greater than or equal to 90% of their needs  MONITOR:   PO intake, Supplement acceptance, I & O's, Weight trends, Labs  REASON FOR ASSESSMENT:   Other (underweight BMI)    ASSESSMENT:   62 year old male who presented to the ED on 9/10 after being found wandering in the streets confused. PMH significant for EtOH abuse, tobacco use, hypertension. Pt found to have acute CVA.  Noted therapies are recommending SNF at this time.  Attempted to speak and interact with pt at bedside. Pt opened eyes and nodded head once. Pt did not speak with RD.  Per RN note, pt able to hold food and drinks in right hand but is refusing feeding assistance.  Meal Completion: 75-100%  Medications reviewed and include: 1 mg folic acid daily, MVI with minerals daily, IV KCl, 500 mg IV thiamine q 8 hours  Labs reviewed.  UOP: 1450 ml x 24 hours I/O's: +2.4 L since admit  NUTRITION - FOCUSED PHYSICAL EXAM:    Most Recent Value  Orbital Region  Moderate depletion  Upper Arm Region  Severe depletion  Thoracic and Lumbar Region  Severe depletion  Buccal Region  Moderate depletion  Temple Region  Moderate depletion  Clavicle Bone Region  Severe depletion  Clavicle and Acromion Bone Region  Severe depletion  Scapular Bone Region  Moderate depletion  Dorsal Hand  Moderate depletion  Patellar Region  Severe depletion  Anterior Thigh Region  Severe depletion  Posterior Calf Region   Severe depletion  Edema (RD Assessment)  None  Hair  Reviewed  Eyes  Reviewed  Mouth  Unable to assess  Skin  Reviewed  Nails  Reviewed       Diet Order:   Diet Order            Diet regular Room service appropriate? Yes; Fluid consistency: Thin  Diet effective now              EDUCATION NEEDS:   Not appropriate for education at this time  Skin:  Skin Assessment: Reviewed RN Assessment  Last BM:  unknown/PTA  Height:   Ht Readings from Last 1 Encounters:  03/18/18 6\' 1"  (1.854 m)    Weight:   Wt Readings from Last 1 Encounters:  03/18/18 59.7 kg    Ideal Body Weight:  83.64 kg  BMI:  Body mass index is 17.36 kg/m.  Estimated Nutritional Needs:   Kcal:  1800-2000  Protein:  90-105 grams  Fluid:  1.8-2.0 L    Adam ReadingKate Jablonski Ikeem Cleckler, MS, RD, LDN Pager: [phone] Weekend/After Hours: [phone]

## 2018-03-19 NOTE — Consult Note (Addendum)
[place] A. Merlene Laughter, MD     [email or web address]          Adam Koch is an 61 y.o. male.   ASSESSMENT/PLAN: 1. Altered mental status due to acute ischemic stroke involving the right middle cerebral artery distribution (Risk factors of alcoholism, hypertension and age). I think this is most likely embolic in a suspected likely of cardiac origin given the patient's history of alcoholism. Consequently, a 30 day event monitor is recommended. CT of the head is recommended to evaluate for intracranial occlusive disease. Continue with aspirin 325. 2. Multifactorial encephalopathy due to medication effect from Ativan and acute ischemic stroke. 3. Alcoholism: Cessation is recommended. 4. Hypertension   The patient is a 62 year old black male who presents with acute confusion and altered mental status. History is not obtainable because of the confusion. The patient does have a history of alcoholism and has been on Ativan protocol for DVTs in the hospital. He was examined by me in 3-4 hours after his last dose of Ativan and is quite unresponsive. Nurses report however that he has all been able to answer a few questions at baseline of the hospital.   GENERAL: The patient is laying in bed and mostly unresponsive. He has the stigmata of chronic alcohol use and chronic nicotine use.  HEENT: Supple. Atraumatic normocephalic. Poor dentition  ABDOMEN: soft  EXTREMITIES: No edema   BACK: Normal.  SKIN: Normal by inspection.    MENTAL STATUS: The patient is quite stuporous quite vigorous painful stimulation to open his eyes but he closes it rather quickly. He does follow brief midline and appendicular commands with vigorous stimulation. He is mute.  CRANIAL NERVES: Pupils Right is 5 mm and left 4. They're both reactive to light. The extra ocular movements are full, there is no significant nystagmus; visual fields - Very limited exam due to the patient not keeping eyes open but  appears somewhat full -??; There is mild flattening of the nasolabial fold on the left otherwise facial muscles are symmetric. Tongue is midline;   MOTOR: The patient moves the right side vigorously and a normally. Tone and bulk are normal throughout. He moves the left side less. There is antigravity strength throughout on both sides in upper or lower extremities. There is profound drift in the upper and lower extremities.  COORDINATION: No obvious tremors or dysmetria appreciated. No parkinsonism noted.  REFLEXES: Deep tendon reflexes are symmetrical and normal.    SENSATION: He responds to pain. My normally on the right but clearly response less on the left side.    NIH stroke scale 2, 2, 1, 2, 2, 2, 2, 1, 3 total 13.     Blood pressure 138/88, pulse 61, temperature 98.3 F (36.8 C), temperature source Axillary, resp. rate 16, height 6' 1" (1.854 m), weight 59.7 kg, SpO2 100 %.  Past Medical History:  Diagnosis Date  . Alcohol abuse   . Hypertension   . Stroke [facility])     History reviewed. No pertinent surgical history.  History reviewed. No pertinent family history.  Social History:  reports that he has been smoking cigarettes. He has never used smokeless tobacco. He reports that he drank alcohol. He reports that he does not use drugs.  Allergies: No Known Allergies  Medications: Prior to Admission medications   Not on File    Scheduled Meds: . aspirin  325 mg Oral Daily  . folic acid  1 mg Oral Daily  . heparin  5,000 Units Subcutaneous Q8H  .  LORazepam  0-4 mg Intravenous Q6H   Followed by  . LORazepam  0-4 mg Intravenous Q12H  . multivitamin with minerals  1 tablet Oral Daily   Continuous Infusions: . 0.9 % NaCl with KCl 20 mEq / L 125 mL/hr at 03/19/18 0600  . thiamine injection 500 mg (03/19/18 2956)   PRN Meds:.acetaminophen **OR** acetaminophen (TYLENOL) oral liquid 160 mg/5 mL **OR** acetaminophen, hydrALAZINE, LORazepam **OR** LORazepam, ondansetron  **OR** ondansetron (ZOFRAN) IV, senna-docusate     Results for orders placed or performed during the hospital encounter of 03/17/18 (from the past 48 hour(s))  CBC with Differential/Platelet     Status: Abnormal   Collection Time: 03/17/18  5:38 PM  Result Value Ref Range   WBC 11.9 (H) 4.0 - 10.5 K/uL   RBC 5.15 4.22 - 5.81 MIL/uL   Hemoglobin 16.5 13.0 - 17.0 g/dL   HCT 49.4 39.0 - 52.0 %   MCV 95.9 78.0 - 100.0 fL   MCH 32.0 26.0 - 34.0 pg   MCHC 33.4 30.0 - 36.0 g/dL   RDW 12.9 11.5 - 15.5 %   Platelets 273 150 - 400 K/uL   Neutrophils Relative % 74 %   Neutro Abs 8.7 (H) 1.7 - 7.7 K/uL   Lymphocytes Relative 17 %   Lymphs Abs 2.0 0.7 - 4.0 K/uL   Monocytes Relative 9 %   Monocytes Absolute 1.1 (H) 0.1 - 1.0 K/uL   Eosinophils Relative 0 %   Eosinophils Absolute 0.0 0.0 - 0.7 K/uL   Basophils Relative 0 %   Basophils Absolute 0.1 0.0 - 0.1 K/uL    Comment: Performed at [facility], [address], [place], [place] [address]  Comprehensive metabolic panel     Status: Abnormal   Collection Time: 03/17/18  5:38 PM  Result Value Ref Range   Sodium 138 135 - 145 mmol/L   Potassium 3.7 3.5 - 5.1 mmol/L   Chloride 103 98 - 111 mmol/L   CO2 23 22 - 32 mmol/L   Glucose, Bld 101 (H) 70 - 99 mg/dL   BUN 17 6 - 20 mg/dL   Creatinine, Ser 0.97 0.61 - 1.24 mg/dL   Calcium 9.6 8.9 - 10.3 mg/dL   Total Protein 8.4 (H) 6.5 - 8.1 g/dL   Albumin 4.5 3.5 - 5.0 g/dL   AST 33 15 - 41 U/L   ALT 21 0 - 44 U/L   Alkaline Phosphatase 93 38 - 126 U/L   Total Bilirubin 1.1 0.3 - 1.2 mg/dL   GFR calc non Af Amer >60 >60 mL/min   GFR calc Af Amer >60 >60 mL/min    Comment: (NOTE) The eGFR has been calculated using the CKD EPI equation. This calculation has not been validated in all clinical situations. eGFR's persistently <60 mL/min signify possible Chronic Kidney Disease.    Anion gap 12 5 - 15    Comment: Performed at [facility], [address], [place], [place] [address]  Ammonia      Status: None   Collection Time: 03/17/18  5:38 PM  Result Value Ref Range   Ammonia 13 9 - 35 umol/L    Comment: Performed at [facility], [address], [place], [place] [address]  Ethanol     Status: None   Collection Time: 03/17/18  5:38 PM  Result Value Ref Range   Alcohol, Ethyl (B) <10 <10 mg/dL    Comment: Performed at [facility], [address] [place], [place] [address]  Magnesium  Status: None   Collection Time: 03/17/18  5:38 PM  Result Value Ref Range   Magnesium 2.2 1.7 - 2.4 mg/dL    Comment: Performed at [facility], [address], [place], [place] [address]  Phosphorus     Status: None   Collection Time: 03/17/18  5:38 PM  Result Value Ref Range   Phosphorus 3.9 2.5 - 4.6 mg/dL    Comment: Performed at [facility], [address] [place], [place] [address]  HIV antibody (Routine Testing)     Status: None   Collection Time: 03/17/18  5:38 PM  Result Value Ref Range   HIV Screen 4th Generation wRfx Non Reactive Non Reactive    Comment: (NOTE) Performed At: [facility] [place], [place] [id number] Rush Farmer MD [id number]   Hemoglobin A1c     Status: None   Collection Time: 03/18/18  5:03 AM  Result Value Ref Range   Hgb A1c MFr Bld 5.4 4.8 - 5.6 %    Comment: (NOTE) Pre diabetes:          5.7%-6.4% Diabetes:              >6.4% Glycemic control for   <7.0% adults with diabetes    Mean Plasma Glucose 108.28 mg/dL    Comment: Performed at [facility] [address] [place], [place] [address]  CBC     Status: None   Collection Time: 03/18/18  5:03 AM  Result Value Ref Range   WBC 9.7 4.0 - 10.5 K/uL   RBC 5.01 4.22 - 5.81 MIL/uL   Hemoglobin 15.9 13.0 - 17.0 g/dL   HCT 48.2 39.0 - 52.0 %   MCV 96.2 78.0 - 100.0 fL   MCH 31.7 26.0 - 34.0 pg   MCHC 33.0 30.0 - 36.0 g/dL   RDW 12.9 11.5 - 15.5 %   Platelets 251 150 - 400 K/uL    Comment: Performed at [facility], [address], [place], [place] [address]    Basic metabolic panel     Status: Abnormal   Collection Time: 03/18/18  5:03 AM  Result Value Ref Range   Sodium 139 135 - 145 mmol/L   Potassium 3.9 3.5 - 5.1 mmol/L   Chloride 106 98 - 111 mmol/L   CO2 22 22 - 32 mmol/L   Glucose, Bld 67 (L) 70 - 99 mg/dL   BUN 16 6 - 20 mg/dL   Creatinine, Ser 0.78 0.61 - 1.24 mg/dL   Calcium 9.0 8.9 - 10.3 mg/dL   GFR calc non Af Amer >60 >60 mL/min   GFR calc Af Amer >60 >60 mL/min    Comment: (NOTE) The eGFR has been calculated using the CKD EPI equation. This calculation has not been validated in all clinical situations. eGFR's persistently <60 mL/min signify possible Chronic Kidney Disease.    Anion gap 11 5 - 15    Comment: Performed at [facility], [address] [place], [place] [address]  Lipid panel     Status: Abnormal   Collection Time: 03/18/18  5:03 AM  Result Value Ref Range   Cholesterol 115 0 - 200 mg/dL   Triglycerides 34 <150 mg/dL   HDL 40 (L) >40 mg/dL   Total CHOL/HDL Ratio 2.9 RATIO   VLDL 7 0 - 40 mg/dL   LDL Cholesterol 68 0 - 99 mg/dL    Comment:        Total Cholesterol/HDL:CHD Risk Coronary Heart Disease Risk Table  Men   Women  1/2 Average Risk   3.4   3.3  Average Risk       5.0   4.4  2 X Average Risk   9.6   7.1  3 X Average Risk  23.4   11.0        Use the calculated Patient Ratio above and the CHD Risk Table to determine the patient's CHD Risk.        ATP III CLASSIFICATION (LDL):  <100     mg/dL   Optimal  100-129  mg/dL   Near or Above                    Optimal  130-159  mg/dL   Borderline  160-189  mg/dL   High  >190     mg/dL   Very High Performed at [facility], [address], [place], [place] [address]   Vitamin B12     Status: None   Collection Time: 03/18/18  3:56 PM  Result Value Ref Range   Vitamin B-12 589 180 - 914 pg/mL    Comment: (NOTE) This assay is not validated for testing neonatal or myeloproliferative syndrome specimens for Vitamin B12  levels. Performed at [facility], [address], [place], [place] [address]   Folate     Status: None   Collection Time: 03/18/18  3:56 PM  Result Value Ref Range   Folate 11.5 >5.9 ng/mL    Comment: Performed at [facility], [address], [place], [place] [address]  TSH     Status: None   Collection Time: 03/18/18  3:56 PM  Result Value Ref Range   TSH 1.094 0.350 - 4.500 uIU/mL    Comment: Performed by a 3rd Generation assay with a functional sensitivity of <=0.01 uIU/mL. Performed at [facility], [address], [place], [place] [address]   RPR     Status: None   Collection Time: 03/18/18  3:56 PM  Result Value Ref Range   RPR Ser Ql Non Reactive Non Reactive    Comment: (NOTE) Performed At: [facility] [place], [place] [id number] Rush Farmer MD [id number]   Ammonia     Status: None   Collection Time: 03/18/18  3:56 PM  Result Value Ref Range   Ammonia 21 9 - 35 umol/L    Comment: Performed at [facility], [address], [place], [place] [address]  Urinalysis, Complete w Microscopic     Status: Abnormal   Collection Time: 03/18/18  7:10 PM  Result Value Ref Range   Color, Urine YELLOW YELLOW   APPearance CLEAR CLEAR   Specific Gravity, Urine 1.018 1.005 - 1.030   pH 5.0 5.0 - 8.0   Glucose, UA NEGATIVE NEGATIVE mg/dL   Hgb urine dipstick SMALL (A) NEGATIVE   Bilirubin Urine NEGATIVE NEGATIVE   Ketones, ur 20 (A) NEGATIVE mg/dL   Protein, ur NEGATIVE NEGATIVE mg/dL   Nitrite NEGATIVE NEGATIVE   Leukocytes, UA NEGATIVE NEGATIVE   RBC / HPF 0-5 0 - 5 RBC/hpf   WBC, UA 0-5 0 - 5 WBC/hpf   Bacteria, UA NONE SEEN NONE SEEN   Hyaline Casts, UA PRESENT     Comment: Performed at [facility], [address], [place],  [address]  Urine rapid drug screen (hosp performed)     Status: Abnormal   Collection Time: 03/18/18  7:10 PM  Result Value Ref Range   Opiates NONE DETECTED NONE DETECTED  Cocaine NONE DETECTED NONE DETECTED    Benzodiazepines NONE DETECTED NONE DETECTED   Amphetamines NONE DETECTED NONE DETECTED   Tetrahydrocannabinol POSITIVE (A) NONE DETECTED   Barbiturates NONE DETECTED NONE DETECTED    Comment: (NOTE) DRUG SCREEN FOR MEDICAL PURPOSES ONLY.  IF CONFIRMATION IS NEEDED FOR ANY PURPOSE, NOTIFY LAB WITHIN 5 DAYS. LOWEST DETECTABLE LIMITS FOR URINE DRUG SCREEN Drug Class                     Cutoff (ng/mL) Amphetamine and metabolites    1000 Barbiturate and metabolites    200 Benzodiazepine                 497 Tricyclics and metabolites     300 Opiates and metabolites        300 Cocaine and metabolites        300 THC                            50 Performed at [facility], [address], [place], [place] [address]     Studies/Results:  CAROTID  IMPRESSION: 1. Bilateral carotid bifurcation plaque resulting in less than 50% diameter ICA stenosis. 2.  Antegrade bilateral vertebral arterial flow.      MRI MRA BRAIN FINDINGS: MRI HEAD FINDINGS  Brain: The study is markedly degraded by motion. There is a 6-7 cm region of acute infarction in the right middle cerebral artery territory affecting the posterior temporal lobe and parietal lobe. Few smaller patchy areas of acute infarction anterior to that at the frontoparietal junction region. Mild swelling but no sign of hemorrhage. Old basal ganglia infarctions bilaterally. No sign of hydrocephalus  I think there may also be acute infarction in the inferior cerebellum on the left, though this is less certain because of motion artifact.  Vascular: No vascular information on this exam.  Skull and upper cervical spine: No abnormality seen.  Sinuses/Orbits: Negative  Other: None  MRA HEAD FINDINGS  Marked motion degradation. Both internal carotid arteries are patent through the skull base. There is at least some flow in the middle cerebral artery branches and in the anterior cerebral arteries. Beyond that, no  information is available regarding the anterior circulation. Both vertebral arteries are patent to the basilar. The basilar artery is patent. Posterior circulation branch vessels are not visible due to motion.  IMPRESSION: Severely motion degraded exam. 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 Mild swelling but no evidence of hemorrhage on these limited images. Question small region of acute infarction in the inferior cerebellum on the left as well. If this is present, this would certainly elevate the likelihood of embolic disease from the heart or ascending aorta. If not a real finding, most likely source the embolus would be the right carotid bifurcation.    TTE - Left ventricle: The cavity size was normal. Wall thickness was   normal. Systolic function was normal. The estimated ejection   fraction was in the range of 55% to 60%. Wall motion was normal;   there were no regional wall motion abnormalities. Left   ventricular diastolic function parameters were normal. - Aortic valve: Valve area (VTI): 2.87 cm^2. Valve area (Vmax):   3.16 cm^2. - Technically adequate study.   The brain MRI and MRA are reviewed in person.  There is significant motion artifact making  the MRA unreadable.  The MRI shows a large increased signal on DWI with associated reduced signal involving the right parietal and temporal area extending somewhat to the posterior frontal region.  This is consistent with acute ischemic stroke.   A. Merlene Laughter, M.D.  Diplomate, Tax adviser of Psychiatry and Neurology ( Neurology). 03/19/2018, 10:29 AM

## 2018-03-19 NOTE — Progress Notes (Signed)
Pt able to squeeze hand when asked several times but not understanding other commands for neuro assessments. Pt was assisted to the bathroom and voided in urinal. Gate was unsteady though and his awareness and understanding commands seemed to be off. Will continueto monitor.

## 2018-03-19 NOTE — Progress Notes (Signed)
  Speech Language Pathology Treatment: Cognitive-Linquistic  Patient Details Name: Adam Koch MRN: [id number] DOB: 02/26/1956 Today's Date: 03/19/2018 Time: [id number] SLP Time Calculation (min) (ACUTE ONLY): 23 min  Assessment / Plan / Recommendation Clinical Impression  Pt seen in room during lunch meal with roommate present. Pt continues to present with significant left neglect, disorientation, reduced proprioception, awareness, attention, and memory. He required max verbal and tactile cues to attend to people, objects, food, and body parts on his left side. He was given binary choices for orientation (hotel/hospital, September/December) and only achieved 50% accuracy with responses. He had difficulty self feeding with utensils, however when foods were placed in his hands, he was able to self feed and continue with mod verbal cues for attention to task (highly distracted by looking out the window despite shade pulled). SLP provided education regarding reorientation and cueing to incorporate both extremities as able during self care tasks. Pt will need supervision and assist with meals due to severity of left neglect and fluctuating alertness.     HPI HPI: Adam [name] a 62 y.o.malewith medical history significant of alcohol abuse, tobacco use, hypertension who is coming to the emergency department due to being found by EMS and RDP wandering on the streets confused. He is unable to provide further history. A friend of the patient talked to EMS and stated that he drinks regularly. He smokes daily an unknown amount of cigarettes. He is only oriented to name and does not follow most simple commands. Severely motion degraded exam. 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 Mild swelling but no evidence of hemorrhage on these limited images. Question small region of acute infarction in the  inferior cerebellum on the left as well. Pt failed RN swallow screen and RN requested BSE.      SLP Plan  Continue with current plan of care       Recommendations                   Plan: Continue with current plan of care       Thank you,  Adam MorosDabney Quinnlan Koch, CCC-SLP [phone]                 Adam Koch 03/19/2018, 4:11 PM

## 2018-03-19 NOTE — Progress Notes (Signed)
Patient resting in bed. Patient able to tell me his na,e and that is all. Patient able to perform neuro assessment and on reassessment of patient he is unable to follow simple commands. Patient awareness and understandings is not intact. Patient offers no complaints. Will continue to monitor.

## 2018-03-20 ENCOUNTER — Telehealth: Payer: Self-pay | Admitting: *Deleted

## 2018-03-20 LAB — CBC
HCT: 47.8 % (ref 39.0–52.0)
Hemoglobin: 15.8 g/dL (ref 13.0–17.0)
MCH: 31.7 pg (ref 26.0–34.0)
MCHC: 33.1 g/dL (ref 30.0–36.0)
MCV: 96 fL (ref 78.0–100.0)
PLATELETS: 226 10*3/uL (ref 150–400)
RBC: 4.98 MIL/uL (ref 4.22–5.81)
RDW: 12.9 % (ref 11.5–15.5)
WBC: 7.9 10*3/uL (ref 4.0–10.5)

## 2018-03-20 LAB — BASIC METABOLIC PANEL
Anion gap: 5 (ref 5–15)
BUN: 9 mg/dL (ref 6–20)
CALCIUM: 8.4 mg/dL — AB (ref 8.9–10.3)
CO2: 23 mmol/L (ref 22–32)
CREATININE: 0.71 mg/dL (ref 0.61–1.24)
Chloride: 107 mmol/L (ref 98–111)
GFR calc Af Amer: >60 mL/min (ref >60)
GFR calc non Af Amer: >60 mL/min (ref >60)
GLUCOSE: 93 mg/dL (ref 70–99)
Potassium: 4.2 mmol/L (ref 3.5–5.1)
Sodium: 135 mmol/L (ref 135–145)

## 2018-03-20 NOTE — Telephone Encounter (Signed)
-----   Message from Ellsworth LennoxBrittany M Strader, [place] sent at 03/19/2018 11:39 AM EDT ----- Regarding: 30-day event monitor Kisha,   This patient needs a 30-day event monitor for CVA per Dr. Gerilyn Pilgrimoonquah. He is not an established patient with [facility] so any of the MD's should be able to read it.   Thank you for your help!  Best,  [place]any

## 2018-03-20 NOTE — Progress Notes (Signed)
PROGRESS NOTE  Pierson Withington UJW:[id number] DOB: [birth year] DOA: 03/17/2018 PCP: Patient, No Pcp Per  Brief History: 62 year old male with a history of alcohol abuse, tobacco use, hypertension presented with altered mental status. Patient is unable to provide any history secondary to his encephalopathy. Apparently, the patient was agitated and confused on the afternoon of 03/17/2018. According to his girlfriend, the patient walked outside into the backyard in his underwear and began pulling out flowers. Because of his agitation, she called the police who arrived and recommended EMS to bring the patient to the hospital. According to the patient's girlfriend, the patient was "acting drunk"on 03/15/2018.She states that she last saw him normal on 03/14/2018. She states that he frequently goes out to drink alcohol after he gets paid, although she cannot elaborate how much and how often and the patient drinks. However, she states that he has drank alcohol regularly for nearly 20 years. The patient continues to smoke tobacco although the amount is unclear. The been no reports of chest pain, shortness breath, vomiting, diarrhea, abdominal pain, chest pain. In the emergency department, the patient was afebrile hemodynamically stable saturating 100% on room air. He was only oriented to name.  Assessment/Plan: Acute metabolic encephalopathy -Multifactorial including Wernicke's encephalopathy, stroke, and possibly hypertensive encephalopathy and THC -At baseline, the patient is alert and oriented x4 -Currently the patient is alert and oriented x1 -Serum B12--589 -TSH--1.094 -Urinalysis--neg for pyuria -Folic acid--11.5 -RPR--neg -Ammonia--21 -UDS--+THC -Thiamine 500 mg q 8 hours x 6, then 100mg  daily  Acute right MCA infarct -Neurology Consult appreciated-->30 day event monitor -PT/OT evaluation-->SNF -Speech therapy eval-->regular diet with thin liquids -CT brain--Moderate area  of edema/acute to subacute infarct in the right parietal lobe without evidence for hemorrhage or midline shift -MRI brain--acute infarctR-MCA territorywith few other smaller foci in the same territory;questionable infarct in the inferior cerebellum on the left -MRA brain--motion degraded, no hemodynamically significant stenosis -Carotid Duplex--negative for hemodynamically significant stenosis -CTA H&N--no large vessel occlusion, stable large right MCA infarct, mild interval increase in edema with mild mass-effect around area of infarct.  Petechial hemorrhage of right parietal lobe.  Distal right MCA parietal branch occlusion. -Echo--EF 55-60%, no WMA, no PFO noted -LDL--68 -HbA1C--5.4 -Antiplatelet--ASA 325 mg -9/13--case discussed with neurology, Dr. Lurene Shadow vasogenic edema to gradually improve;  No need for mannitol  Essential hypertension -Allowing for permissive hypertension in first 24 hours -hydralazine prn SBP >220 -start amlodipine  Alcohol abuse -alcohol withdraw protocol  Tobacco abuse I have discussed tobacco cessation with the patient. I have counseled the patient regarding the negative impacts of continued tobacco use including but not limited to lung cancer, COPD, and cardiovascular disease. I have discussed alternatives to tobacco and modalities that may help facilitate tobacco cessation including but not limited to biofeedback, hypnosis, and medications. Total time spent with tobacco counseling was 4 minutes.  Severe malnutrition -started supplements    Disposition Plan:SNFwhen bed available Family Communication:Cousin and niece updatedat bedside 9/11  Consultants:neurology  Code Status: FULL   DVT Prophylaxis: [place] Heparin    Procedures: As Listed in Progress Note Above  Antibiotics: None    Subjective: Patient is more alert and conversant today but remains pleasantly confused.  He has intermittent agitation.   He denies any headache, chest pain, shortness breath, abdominal pain, vomiting, diarrhea, fevers.  Objective: Vitals:   03/20/18 0414 03/20/18 0913 03/20/18 1224 03/20/18 1607  BP: (!) 176/126 (!) 149/128 (!) 160/106 (!) 182/82  Pulse: Marland Kitchen)  57 85 60 (!) 56  Resp: 16 16 16 16   Temp: 97.7 F (36.5 C)  97.7 F (36.5 C) 97.8 F (36.6 C)  TempSrc: Oral  Oral Oral  SpO2: 100% 100% 100% 100%  Weight:      Height:        Intake/Output Summary (Last 24 hours) at 03/20/2018 1740 Last data filed at 03/20/2018 0946 Gross per 24 hour  Intake 240 ml  Output 400 ml  Net -160 ml   Weight change:  Exam:   General:  Pt is alert, follows commands appropriately, not in acute distress  HEENT: No icterus, No thrush, No neck mass, [place]/AT  Cardiovascular: RRR, S1/S2, no rubs, no gallops  Respiratory: CTA bilaterally, no wheezing, no crackles, no rhonchi  Abdomen: Soft/+BS, non tender, non distended, no guarding  Extremities: No edema, No lymphangitis, No petechiae, No rashes, no synovitis   Data Reviewed: I have personally reviewed following labs and imaging studies Basic Metabolic Panel: Recent Labs  Lab 03/17/18 1738 03/18/18 0503 03/20/18 0503  NA 138 139 135  K 3.7 3.9 4.2  CL 103 106 107  CO2 23 22 23   GLUCOSE 101* 67* 93  BUN 17 16 9   CREATININE 0.97 0.78 0.71  CALCIUM 9.6 9.0 8.4*  MG 2.2  --   --   PHOS 3.9  --   --    Liver Function Tests: Recent Labs  Lab 03/17/18 1738  AST 33  ALT 21  ALKPHOS 93  BILITOT 1.1  PROT 8.4*  ALBUMIN 4.5   No results for input(s): LIPASE, AMYLASE in the last 168 hours. Recent Labs  Lab 03/17/18 1738 03/18/18 1556  AMMONIA 13 21   Coagulation Profile: No results for input(s): INR, PROTIME in the last 168 hours. CBC: Recent Labs  Lab 03/17/18 1738 03/18/18 0503 03/20/18 0503  WBC 11.9* 9.7 7.9  NEUTROABS 8.7*  --   --   HGB 16.5 15.9 15.8  HCT 49.4 48.2 47.8  MCV 95.9 96.2 96.0  PLT 273 251 226   Cardiac  Enzymes: No results for input(s): CKTOTAL, CKMB, CKMBINDEX, TROPONINI in the last 168 hours. BNP: Invalid input(s): POCBNP CBG: No results for input(s): GLUCAP in the last 168 hours. HbA1C: Recent Labs    03/18/18 0503  HGBA1C 5.4   Urine analysis:    Component Value Date/Time   COLORURINE YELLOW 03/18/2018 1910   APPEARANCEUR CLEAR 03/18/2018 1910   LABSPEC 1.018 03/18/2018 1910   PHURINE 5.0 03/18/2018 1910   GLUCOSEU NEGATIVE 03/18/2018 1910   HGBUR SMALL (A) 03/18/2018 1910   BILIRUBINUR NEGATIVE 03/18/2018 1910   KETONESUR 20 (A) 03/18/2018 1910   PROTEINUR NEGATIVE 03/18/2018 1910   NITRITE NEGATIVE 03/18/2018 1910   LEUKOCYTESUR NEGATIVE 03/18/2018 1910   Sepsis Labs: @LABRCNTIP (procalcitonin:4,lacticidven:4) )No results found for this or any previous visit (from the past 240 hour(s)).   Scheduled Meds: . aspirin  325 mg Oral Daily  . feeding supplement (ENSURE ENLIVE)  237 mL Oral TID BM  . folic acid  1 mg Oral Daily  . heparin  5,000 Units Subcutaneous Q8H  . LORazepam  0-4 mg Intravenous Q12H  . multivitamin with minerals  1 tablet Oral Daily   Continuous Infusions: . 0.9 % NaCl with KCl 20 mEq / L 125 mL/hr at 03/20/18 0544    Procedures/Studies: Ct Angio Head W Or Wo Contrast  Result Date: 03/19/2018 CLINICAL DATA:  62 y/o M; right MCA distribution stroke with altered mental status for follow-up. EXAM:  CT ANGIOGRAPHY HEAD TECHNIQUE: Multidetector CT imaging of the head was performed using the standard protocol during bolus administration of intravenous contrast. Multiplanar CT image reconstructions and MIPs were obtained to evaluate the vascular anatomy. CONTRAST:  75mL ISOVUE-370 IOPAMIDOL (ISOVUE-370) INJECTION 76% COMPARISON:  03/17/2018 CT head. 03/18/2018 MRI and MRA head. 03/18/2018 carotid ultrasound. FINDINGS: CT HEAD Brain: Stable distribution of large right posterior MCA subacute infarctions mild interval increase in edema and local mass effect  from the prior study. Additionally, there are punctate density in the right paramedian high parietal region (series 4, image 27) probably representing petechial hemorrhage. Stable chronic left parietal and bilateral anterior basal ganglia infarctions. Stable chronic microvascular ischemic changes and volume loss of the brain. No extra-axial collection, hydrocephalus, or herniation. Vascular: As below. Skull: Normal. Negative for fracture or focal lesion. Sinuses: Imaged portions are clear. Orbits: No acute finding. CTA HEAD Anterior circulation: Calcific atherosclerosis of carotid siphons with mild bilateral distal cavernous and paraclinoid stenosis. No proximal large vessel occlusion, aneurysm, or high-grade stenosis. Short segment of occlusion of a distal right MCA parietal branch (series 12, image 117 and series 15, image 12). Posterior circulation: Right P2 long segment of moderate occlusion. No additional stenosis in the posterior circulation. No large vessel occlusion, or aneurysm. No vascular malformation. Venous sinuses: As permitted by contrast timing, patent. Anatomic variants: None significant. Delayed phase: Enhancing laminar necrosis of the left parietal region of chronic infarction. No additional focus of abnormal enhancement of the brain. IMPRESSION: 1. Stable distribution of large right posterior MCA distribution infarction predominantly involving the right parietal lobe. Mild interval increase in edema and local mass effect. Petechial hemorrhage in right superior paramedian parietal lobe. 2. Distal right MCA parietal branch occlusion. 3. No large vessel occlusion, aneurysm, or proximal high-grade stenosis of the anterior or posterior intracranial circulation. 4. Carotid siphon calcific atherosclerosis with mild distal cavernous and paraclinoid stenosis. 5. Right P2 long segment of moderate stenosis. Electronically Signed   By: Mitzi Hansen M.D.   On: 03/19/2018 22:30   Ct Head Wo  Contrast  Result Date: 03/17/2018 CLINICAL DATA:  Altered LOC EXAM: CT HEAD WITHOUT CONTRAST TECHNIQUE: Contiguous axial images were obtained from the base of the skull through the vertex without intravenous contrast. COMPARISON:  None. FINDINGS: Brain: No hemorrhage or intracranial mass. Low-density edema within the right parietal lobe consistent with infarct. Low attenuation within the left parietal lobe with associated mild volume loss, suggesting prior infarct. Chronic appearing infarcts within the bilateral basal ganglia. Moderate small vessel ischemic change of the white matter. Nonenlarged ventricles. Vascular: No hyperdense vessels.  Carotid vascular calcification Skull: No fracture Sinuses/Orbits: Old appearing blowout fractures of the medial walls of both orbits. Old appearing nasal bone fracture. No acute abnormality Other: None IMPRESSION: 1. Moderate area of edema/acute to subacute infarct in the right parietal lobe without evidence for hemorrhage or midline shift. 2. Suspected chronic infarct within the left parietal lobe. Chronic infarcts within the bilateral basal ganglia with small vessel ischemic changes of the white matter. Electronically Signed   By: Jasmine Pang M.D.   On: 03/17/2018 19:26   Mr Brain Wo Contrast  Result Date: 03/18/2018 CLINICAL DATA:  Mental status changes.  Confusion. EXAM: MRI HEAD WITHOUT CONTRAST MRA HEAD WITHOUT CONTRAST TECHNIQUE: Multiplanar, multiecho pulse sequences of the brain and surrounding structures were obtained without intravenous contrast. Angiographic images of the head were obtained using MRA technique without contrast. COMPARISON:  CT 03/17/2018 FINDINGS: MRI HEAD FINDINGS Brain: The study is  markedly degraded by motion. There is a 6-7 cm region of acute infarction in the right middle cerebral artery territory affecting the posterior temporal lobe and parietal lobe. Few smaller patchy areas of acute infarction anterior to that at the frontoparietal  junction region. Mild swelling but no sign of hemorrhage. Old basal ganglia infarctions bilaterally. No sign of hydrocephalus I think there may also be acute infarction in the inferior cerebellum on the left, though this is less certain because of motion artifact. Vascular: No vascular information on this exam. Skull and upper cervical spine: No abnormality seen. Sinuses/Orbits: Negative Other: None MRA HEAD FINDINGS Marked motion degradation. Both internal carotid arteries are patent through the skull base. There is at least some flow in the middle cerebral artery branches and in the anterior cerebral arteries. Beyond that, no information is available regarding the anterior circulation. Both vertebral arteries are patent to the basilar. The basilar artery is patent. Posterior circulation branch vessels are not visible due to motion. IMPRESSION: Severely motion degraded exam. 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 Mild swelling but no evidence of hemorrhage on these limited images. Question small region of acute infarction in the inferior cerebellum on the left as well. If this is present, this would certainly elevate the likelihood of embolic disease from the heart or ascending aorta. If not a real finding, most likely source the embolus would be the right carotid bifurcation. Electronically Signed   By: Paulina Fusi M.D.   On: 03/18/2018 07:57   US Carotid Bilateral (at [facility] And [facility] Only)  Result Date: 03/18/2018 CLINICAL DATA:  Acute stroke, confusion.  Previous tobacco abuse. EXAM: BILATERAL CAROTID DUPLEX ULTRASOUND TECHNIQUE: Wallace Cullens scale imaging, color Doppler and duplex ultrasound were performed of bilateral carotid and vertebral arteries in the neck. COMPARISON:  None. FINDINGS: Criteria: Quantification of carotid stenosis is based on velocity parameters that correlate the residual internal  carotid diameter with NASCET-based stenosis levels, using the diameter of the distal internal carotid lumen as the denominator for stenosis measurement. The following velocity measurements were obtained: RIGHT ICA: 51/14 cm/sec CCA: 54/8 cm/sec SYSTOLIC ICA/CCA RATIO:  0.9 ECA: 43 cm/sec LEFT ICA: 63/14 cm/sec CCA: 44/6 cm/sec SYSTOLIC ICA/CCA RATIO:  1.4 ECA: 42 cm/sec RIGHT CAROTID ARTERY: Calcified plaque in the bulb extending to the ICA origin. No high-grade stenosis. Normal waveforms and color Doppler signal. RIGHT VERTEBRAL ARTERY:  Normal flow direction and waveform. LEFT CAROTID ARTERY: Eccentric calcified plaque in the bulb. No high-grade stenosis. Normal waveforms and color Doppler signal. LEFT VERTEBRAL ARTERY:  Normal flow direction and waveform. IMPRESSION: 1. Bilateral carotid bifurcation plaque resulting in less than 50% diameter ICA stenosis. 2.  Antegrade bilateral vertebral arterial flow. Electronically Signed   By: Corlis Leak M.D.   On: 03/18/2018 11:14   Dg Chest Port 1 View  Result Date: 03/17/2018 CLINICAL DATA:  Found wandering the streets confused, question stroke, hypertension, smoker EXAM: PORTABLE CHEST 1 VIEW COMPARISON:  Portable exam 2102 hours without priors for comparison FINDINGS: Normal heart size, mediastinal contours, and pulmonary vascularity. Lungs clear. No pleural effusion or pneumothorax. No acute osseous findings. IMPRESSION: No acute abnormalities. Electronically Signed   By: Ulyses Southward M.D.   On: 03/17/2018 21:25   Mr Maxine Glenn Head/brain ZO Cm  Result Date: 03/18/2018 CLINICAL DATA:  Mental status changes.  Confusion. EXAM: MRI HEAD WITHOUT CONTRAST MRA HEAD WITHOUT CONTRAST TECHNIQUE: Multiplanar, multiecho  pulse sequences of the brain and surrounding structures were obtained without intravenous contrast. Angiographic images of the head were obtained using MRA technique without contrast. COMPARISON:  CT 03/17/2018 FINDINGS: MRI HEAD FINDINGS Brain: The study is  markedly degraded by motion. There is a 6-7 cm region of acute infarction in the right middle cerebral artery territory affecting the posterior temporal lobe and parietal lobe. Few smaller patchy areas of acute infarction anterior to that at the frontoparietal junction region. Mild swelling but no sign of hemorrhage. Old basal ganglia infarctions bilaterally. No sign of hydrocephalus I think there may also be acute infarction in the inferior cerebellum on the left, though this is less certain because of motion artifact. Vascular: No vascular information on this exam. Skull and upper cervical spine: No abnormality seen. Sinuses/Orbits: Negative Other: None MRA HEAD FINDINGS Marked motion degradation. Both internal carotid arteries are patent through the skull base. There is at least some flow in the middle cerebral artery branches and in the anterior cerebral arteries. Beyond that, no information is available regarding the anterior circulation. Both vertebral arteries are patent to the basilar. The basilar artery is patent. Posterior circulation branch vessels are not visible due to motion. IMPRESSION: Severely motion degraded exam. 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 Mild swelling but no evidence of hemorrhage on these limited images. Question small region of acute infarction in the inferior cerebellum on the left as well. If this is present, this would certainly elevate the likelihood of embolic disease from the heart or ascending aorta. If not a real finding, most likely source the embolus would be the right carotid bifurcation. Electronically Signed   By: Paulina FusiMark  Shogry M.D.   On: 03/18/2018 07:57    Catarina Hartshornavid Strummer Canipe, DO  [facility] Pager [phone]  If 7PM-7AM, please contact night-coverage [email or web address] Password [email or web address] 03/20/2018, 5:40 PM   LOS: 3 days

## 2018-03-20 NOTE — Care Management (Signed)
Per Lenon OmsAmber P.  with [employer] . 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 He does have Home Health Benefits, co-pay amount is  $20.00 a day. No PA required. No in-network providers required.  Ph.# [phone] Call Ref.# [id number]

## 2018-03-20 NOTE — Progress Notes (Signed)
[place] A. Merlene Laughter, MD     [email or web address]          Adam Koch is an 62 y.o. male.   Assessment/Plan: 1. 1. Altered mental status due to acute ischemic stroke involving the right middle cerebral artery distribution (Risk factors of alcoholism, hypertension and age). I think this is most likely embolic in a suspected likely of cardiac origin given the patient's history of alcoholism. Consequently, a 30 day event monitor is recommended. CT of the head shows no intracranial occlusive disease. Continue with aspirin 325. 2. Multifactorial encephalopathy due to medication effect from Ativan and acute ischemic stroke. 3. Alcoholism: Cessation is recommended. 4. Hypertension 5. Mild swelling on repeat CT which is expected.  There is also mild petechial hemorrhage which is not unusual for large strokes.  No change in management at this time.  I was asked to see the patient again for follow-up of his CTA and findings on this by the hospitalist.  His drowsiness has improved with presumably reduction in his Ativan.  They reported that the patient's stupor improved significantly with when the Ativan wears off.    GENERAL: The patient is laying in bed and much more responsive than he previously was. He has the stigmata of chronic alcohol use and chronic nicotine use.  HEENT: Supple. Atraumatic normocephalic. Poor dentition  ABDOMEN: soft  EXTREMITIES: No edema   BACK: Normal.  SKIN: Normal by inspection.    MENTAL STATUS: He is awake and alert although somewhat drowsy.  He converses and is lucid although not quite oriented.  He knows he is in the hospital but cannot state why.  He does follow commands briskly.  CRANIAL NERVES: Pupils Right is 5 mm and left 4. They're both reactive to light. The extra ocular movements are full, there is no significant nystagmus; visual fields - shows a left homonymous hemianopia; There is mild flattening of the nasolabial  fold on the left otherwise facial muscles are symmetric. Tongue is midline;   MOTOR: The patient moves the right side vigorously and a normally. Tone and bulk are normal throughout. He moves the left side less. There is antigravity strength throughout on both sides in upper or lower extremities. There is profound drift in the upper and lower extremities.  COORDINATION: No obvious tremors or dysmetria appreciated. No parkinsonism noted.  SENSATION: He responds to pain. My normally on the right but clearly response less on the left side.   CT scan is reviewed in person and shows mild mass-effect involving the infarct on the left parietal temporal frontal area.  There is a couple areas of tiny petechial increased signal consistent with petechial hemorrhage.  CTA shows no intracranial occlusive disease.   Objective: Vital signs in last 24 hours: Temp:  [97.7 F (36.5 C)-99.5 F (37.5 C)] 99.5 F (37.5 C) (09/13 2001) Pulse Rate:  [54-85] 59 (09/13 2001) Resp:  [16-18] 18 (09/13 2001) BP: (149-182)/(74-128) 160/74 (09/13 2001) SpO2:  [100 %] 100 % (09/13 2001)  Intake/Output from previous day: 09/12 0701 - 09/13 0700 In: 600 [P.O.:600] Out: 200 [Urine:200] Intake/Output this shift: No intake/output data recorded. Nutritional status:  Diet Order            Diet regular Room service appropriate? Yes; Fluid consistency: Thin  Diet effective now               Lab Results: Results for orders placed or performed during the hospital encounter of 03/17/18 (from the past 48 hour(s))  Basic metabolic panel     Status: Abnormal   Collection Time: 03/20/18  5:03 AM  Result Value Ref Range   Sodium 135 135 - 145 mmol/L   Potassium 4.2 3.5 - 5.1 mmol/L   Chloride 107 98 - 111 mmol/L   CO2 23 22 - 32 mmol/L   Glucose, Bld 93 70 - 99 mg/dL   BUN 9 6 - 20 mg/dL   Creatinine, Ser 0.71 0.61 - 1.24 mg/dL   Calcium 8.4 (L) 8.9 - 10.3 mg/dL   GFR calc non Af Amer >60 >60 mL/min   GFR  calc Af Amer >60 >60 mL/min    Comment: (NOTE) The eGFR has been calculated using the CKD EPI equation. This calculation has not been validated in all clinical situations. eGFR's persistently <60 mL/min signify possible Chronic Kidney Disease.    Anion gap 5 5 - 15    Comment: Performed at [facility], [address] [place], [place] [address]  CBC     Status: None   Collection Time: 03/20/18  5:03 AM  Result Value Ref Range   WBC 7.9 4.0 - 10.5 K/uL   RBC 4.98 4.22 - 5.81 MIL/uL   Hemoglobin 15.8 13.0 - 17.0 g/dL   HCT 47.8 39.0 - 52.0 %   MCV 96.0 78.0 - 100.0 fL   MCH 31.7 26.0 - 34.0 pg   MCHC 33.1 30.0 - 36.0 g/dL   RDW 12.9 11.5 - 15.5 %   Platelets 226 150 - 400 K/uL    Comment: Performed at [facility], [address], [place], [place] [address]    Lipid Panel Recent Labs    03/18/18 0503  CHOL 115  TRIG 34  HDL 40*  CHOLHDL 2.9  VLDL 7  LDLCALC 68    Studies/Results:   Medications:  Scheduled Meds: . aspirin  325 mg Oral Daily  . feeding supplement (ENSURE ENLIVE)  237 mL Oral TID BM  . folic acid  1 mg Oral Daily  . heparin  5,000 Units Subcutaneous Q8H  . LORazepam  0-4 mg Intravenous Q12H  . multivitamin with minerals  1 tablet Oral Daily   Continuous Infusions: . 0.9 % NaCl with KCl 20 mEq / L 125 mL/hr at 03/20/18 1936   PRN Meds:.acetaminophen **OR** acetaminophen (TYLENOL) oral liquid 160 mg/5 mL **OR** acetaminophen, hydrALAZINE, ondansetron **OR** ondansetron (ZOFRAN) IV, senna-docusate     LOS: 3 days   Macarius Ruark A. Merlene Laughter, M.D.  Diplomate, Tax adviser of Psychiatry and Neurology ( Neurology).

## 2018-03-20 NOTE — Progress Notes (Signed)
Physical Therapy Treatment Patient Details Name: Adam Koch MRN: [id number] DOB: 10/13/1955 Today's Date: 03/20/2018    History of Present Illness  Adam Koch is a 62 y.o. male with medical history significant of alcohol abuse, tobacco use, hypertension who is coming to the emergency department due to being found by EMS and RDP wandering on the streets confused.  He is unable to provide further history.  A friend of the patient talked to EMS and stated that he drinks regularly.  He smokes daily an unknown amount of cigarettes.  He is only oriented to name and does not follow most simple commands.    PT Comments    Patient continues to neglect left side and had difficulty during ambulation especially when directed to turn to the left side, occasional stumbling and frequent stopping to regain standing balance during gait training.  Patient briefly sat in chair before requesting to go back to bed.  Patient will benefit from continued physical therapy in hospital and recommended venue below to increase strength, balance, endurance for safe ADLs and gait.    Follow Up Recommendations  SNF;Supervision/Assistance - 24 hour;Supervision for mobility/OOB     Equipment Recommendations  Cane    Recommendations for Other Services       Precautions / Restrictions Precautions Precautions: Fall Restrictions Weight Bearing Restrictions: No    Mobility  Bed Mobility Overal bed mobility: Modified Independent                Transfers Overall transfer level: Needs assistance Equipment used: Straight cane Transfers: Sit to/from Stand;Stand Pivot Transfers Sit to Stand: Min guard Stand pivot transfers: Min guard       General transfer comment: slow labored movement with severe neglect left side  Ambulation/Gait Ambulation/Gait assistance: Min assist Gait Distance (Feet): 40 Feet Assistive device: Straight cane Gait Pattern/deviations: Step-to pattern;Decreased step length -  right;Decreased step length - left;Decreased stride length Gait velocity: slow   General Gait Details: slow labored cadence with mostly 3 point gait pattern, freezes when instructed to turn to the left requiring repeated verbal/tactile assistance to complete, occasional stumbling    Stairs             Wheelchair Mobility    Modified Rankin (Stroke Patients Only)       Balance Overall balance assessment: Needs assistance Sitting-balance support: Feet supported;No upper extremity supported Sitting balance-Leahy Scale: Good     Standing balance support: During functional activity;Single extremity supported Standing balance-Leahy Scale: Fair Standing balance comment: using SPC                            Cognition Arousal/Alertness: Awake/alert Behavior During Therapy: WFL for tasks assessed/performed Overall Cognitive Status: Impaired/Different from baseline Area of Impairment: Attention;Orientation;Safety/judgement;Awareness                 Orientation Level: Person Current Attention Level: Selective     Safety/Judgement: Decreased awareness of safety;Decreased awareness of deficits     General Comments: tends to neglect left side      Exercises      General Comments        Pertinent Vitals/Pain Pain Assessment: No/denies pain    Home Living                      Prior Function            PT Goals (current goals can now be found in the care  plan section) Acute Rehab PT Goals Patient Stated Goal: return home PT Goal Formulation: With patient Time For Goal Achievement: 04/01/18 Potential to Achieve Goals: Good Progress towards PT goals: Progressing toward goals    Frequency    7X/week      PT Plan Current plan remains appropriate    Co-evaluation              AM-PAC PT "6 Clicks" Daily Activity  Outcome Measure  Difficulty turning over in bed (including adjusting bedclothes, sheets and blankets)?:  None Difficulty moving from lying on back to sitting on the side of the bed? : None Difficulty sitting down on and standing up from a chair with arms (e.g., wheelchair, bedside commode, etc,.)?: A Little Help needed moving to and from a bed to chair (including a wheelchair)?: A Little Help needed walking in hospital room?: A Lot Help needed climbing 3-5 steps with a railing? : A Lot 6 Click Score: 18    End of Session Equipment Utilized During Treatment: Gait belt Activity Tolerance: Patient tolerated treatment well;Patient limited by fatigue Patient left: in bed;with call bell/phone within reach;with bed alarm set Nurse Communication: Mobility status PT Visit Diagnosis: Unsteadiness on feet (R26.81);Other abnormalities of gait and mobility (R26.89);Muscle weakness (generalized) (M62.81)     Time: 4098-1191 PT Time Calculation (min) (ACUTE ONLY): 24 min  Charges:  $Therapeutic Activity: 23-37 mins                     2:29 PM, 03/20/18 Ocie Bob, MPT Physical Therapist with [facility] 336 [phone] office [phone] mobile phone

## 2018-03-20 NOTE — [place] FL2 (Signed)
[place] MEDICA[place] FL2 LEVEL OF CARE SCREENING TOOL     IDENTIFICATION  Patient Name: Adam Koch Birthdate: 09/27/1955 Sex: male Admission Date (Current Location): 03/17/2018  [facility] [place]   Facility and Address:  [facility],  [address], [place] [id number]      Provider Number: [id number]  Attending Physician Name and Address:  Catarina Hartshornat, David, MD  Relative Name and Phone Number:       Current Level of Care: Hospital Recommended Level of Care: Skilled Nursing Facility Prior Approval Number:    Date Approved/Denied:   PASRR Number: [id number] [id number])  Discharge Plan: SNF    Current Diagnoses: Patient Active Problem List   Diagnosis Date Noted  . Protein-calorie malnutrition, severe 03/19/2018  . Acute right MCA stroke (HCC) 03/18/2018  . Acute metabolic encephalopathy 03/18/2018  . Acute CVA (cerebrovascular accident) (HCC) 03/17/2018  . Alcohol abuse 03/17/2018  . Hypertension 03/17/2018  . Tobacco use 03/17/2018  . Leukocytosis 03/17/2018    Orientation RESPIRATION BLADDER Height & Weight     Self  Normal Continent Weight: 131 lb 9.8 oz (59.7 kg) Height:  6\' 1"  (185.4 cm)  BEHAVIORAL SYMPTOMS/MOOD NEUROLOGICAL BOWEL NUTRITION STATUS      Continent Diet(Regular)  AMBULATORY STATUS COMMUNICATION OF NEEDS Skin   Limited Assist Verbally Normal                       Personal Care Assistance Level of Assistance  Bathing, Feeding, Dressing Bathing Assistance: Maximum assistance Feeding assistance: Limited assistance Dressing Assistance: Maximum assistance     Functional Limitations Info  Sight, Hearing, Speech Sight Info: Adequate Hearing Info: Adequate Speech Info: Adequate    SPECIAL CARE FACTORS FREQUENCY  PT (By licensed PT)     PT Frequency: 5x/week              Contractures Contractures Info: Not present    Additional Factors Info  Code Status, Allergies Code Status Info: Full  code Allergies Info: NKA           Current Medications (03/20/2018):  This is the current hospital active medication list Current Facility-Administered Medications  Medication Dose Route Frequency Provider Last Rate Last Dose  . 0.9 % NaCl with KCl 20 mEq/ L  infusion   Intravenous Continuous Bobette Mortiz, David Manuel, MD 125 mL/hr at 03/20/18 0544    . acetaminophen (TYLENOL) tablet 650 mg  650 mg Oral Q4H PRN Bobette Mortiz, David Manuel, MD       Or  . acetaminophen (TYLENOL) solution 650 mg  650 mg Per Tube Q4H PRN Bobette Mortiz, David Manuel, MD       Or  . acetaminophen (TYLENOL) suppository 650 mg  650 mg Rectal Q4H PRN Bobette Mortiz, David Manuel, MD      . aspirin tablet 325 mg  325 mg Oral Daily Catarina Hartshornat, David, MD   325 mg at 03/20/18 [id number]  . feeding supplement (ENSURE ENLIVE) (ENSURE ENLIVE) liquid 237 mL  237 mL Oral TID BM Catarina Hartshornat, David, MD   237 mL at 03/19/18 2000  . folic acid (FOLVITE) tablet 1 mg  1 mg Oral Daily Bobette Mortiz, David Manuel, MD   1 mg at 03/20/18 0907  . heparin injection 5,000 Units  5,000 Units Subcutaneous Q8H Bobette Mortiz, David Manuel, MD   5,000 Units at 03/20/18 0546  . hydrALAZINE (APRESOLINE) injection 10 mg  10 mg Intravenous Q6H PRN Tat, David, MD      . LORazepam (ATIVAN) injection 0-4 mg  0-4 mg Intravenous Q12H Bobette Mo, MD      . LORazepam (ATIVAN) tablet 1 mg  1 mg Oral Q6H PRN Bobette Mo, MD       Or  . LORazepam (ATIVAN) injection 1 mg  1 mg Intravenous Q6H PRN Bobette Mo, MD   1 mg at 03/20/18 0025  . multivitamin with minerals tablet 1 tablet  1 tablet Oral Daily Bobette Mo, MD   1 tablet at 03/20/18 1610  . ondansetron (ZOFRAN) tablet 4 mg  4 mg Oral Q6H PRN Bobette Mo, MD       Or  . ondansetron [facility]) injection 4 mg  4 mg Intravenous Q6H PRN Bobette Mo, MD      . senna-docusate (Senokot-S) tablet 1 tablet  1 tablet Oral QHS PRN Bobette Mo, MD         Discharge Medications: Please see discharge summary for a list  of discharge medications.  Relevant Imaging Results:  Relevant Lab Results:   Additional Information SSN: [id number].   Adam Koch, Adam China, LCSW

## 2018-03-21 MED ORDER — AMLODIPINE BESYLATE 5 MG PO TABS
5.0000 mg | ORAL_TABLET | Freq: Every day | ORAL | Status: DC
  Administered 2018-03-21 – 2018-05-04 (×43): 5 mg via ORAL
  Filled 2018-03-21 (×45): qty 1

## 2018-03-21 MED ORDER — VITAMIN B-1 100 MG PO TABS
100.0000 mg | ORAL_TABLET | Freq: Every day | ORAL | Status: DC
  Administered 2018-03-21 – 2018-05-04 (×45): 100 mg via ORAL
  Filled 2018-03-21 (×45): qty 1

## 2018-03-21 NOTE — Progress Notes (Signed)
PROGRESS NOTE  Adam Koch UVO:[id number] DOB: 11/23/1955 DOA: 03/17/2018 PCP: Patient, No Pcp Per  Brief History: 62 year old male with a history of alcohol abuse, tobacco use, hypertension presented with altered mental status. Patient is unable to provide any history secondary to his encephalopathy. Apparently, the patient was agitated and confused on the afternoon of 03/17/2018. According to his girlfriend, the patient walked outside into the backyard in his underwear and began pulling out flowers. Because of his agitation, she called the police who arrived and recommended EMS to bring the patient to the hospital. According to the patient's girlfriend, the patient was "acting drunk"on 03/15/2018.She states that she last saw him normal on 03/14/2018. She states that he frequently goes out to drink alcohol after he gets paid, although she cannot elaborate how much and how often and the patient drinks. However, she states that he has drank alcohol regularly for nearly 20 years. The patient continues to smoke tobacco although the amount is unclear. The been no reports of chest pain, shortness breath, vomiting, diarrhea, abdominal pain, chest pain. In the emergency department, the patient was afebrile hemodynamically stable saturating 100% on room air. He was only oriented to name.  Assessment/Plan: Acute metabolic encephalopathy -Multifactorial including Wernicke's encephalopathy, stroke, and possibly hypertensive encephalopathyand THC -At baseline, the patient is alert and oriented x4 -Currently the patient is alert and oriented x1 -Serum B12--589 -TSH--1.094 -Urinalysis--neg for pyuria -Folic acid--11.5 -RPR--neg -Ammonia--21 -UDS--+THC -Thiamine 500 mg q 8 hours x 6, then 100mg  daily  Acute right MCA infarct -Neurology Consultappreciated-->30 day event monitor -PT/OT evaluation-->SNF -Speech therapy eval-->regular diet with thin liquids -CT brain--Moderate area  of edema/acute to subacute infarct in the right parietal lobe without evidence for hemorrhage or midline shift -MRI brain--acute infarctR-MCA territorywith few other smaller foci in the same territory;questionable infarct in the inferior cerebellum on the left -MRA brain--motion degraded, no hemodynamically significant stenosis -Carotid Duplex--negative for hemodynamically significant stenosis -CTA H&N--no large vessel occlusion, stable large right MCA infarct, mild interval increase in edema with mild mass-effect around area of infarct.  Petechial hemorrhage of right parietal lobe.  Distal right MCA parietal branch occlusion. -Echo--EF 55-60%, no WMA, no PFO noted -LDL--68 -HbA1C--5.4 -Antiplatelet--ASA 325 mg -9/13--case discussed with neurology, Dr. Lurene [place]--anticipated vasogenic edema to gradually improve;  No need for mannitol  Essential hypertension -Allowing for permissive hypertensionin first 24 hours -hydralazine prn SBP >220 -start amlodipine  Alcohol abuse -alcohol withdraw protocol  Tobacco abuse I have discussed tobacco cessation with the patient. I have counseled the patient regarding the negative impacts of continued tobacco use including but not limited to lung cancer, COPD, and cardiovascular disease. I have discussed alternatives to tobacco and modalities that may help facilitate tobacco cessation including but not limited to biofeedback, hypnosis, and medications. Total time spent with tobacco counseling was 4 minutes.  Severe malnutrition -started supplements    Disposition Plan:SNFwhen bed available Family Communication:Cousin and niece updatedat bedside 9/11  Consultants:neurology  Code Status: FULL   DVT Prophylaxis: [place] Heparin    Procedures: As Listed in Progress Note Above  Antibiotics: None   Subjective: Pt is pleasantly confused.  Denies cp, headache, sob, n/v/d, abd pain.  Objective: Vitals:   03/21/18  0011 03/21/18 0357 03/21/18 0804 03/21/18 1232  BP: (!) 169/89 (!) 168/80 (!) 165/70 (!) 187/91  Pulse: 61 (!) 52 (!) 55 (!) 59  Resp: 18 18 16 16   Temp: 98.8 F (37.1 C) 98.9 F (37.2 C)  97.6 F (36.4 C) 97.8 F (36.6 C)  TempSrc:   Oral Oral  SpO2: 92% 100% 100% 100%  Weight:      Height:        Intake/Output Summary (Last 24 hours) at 03/21/2018 1652 Last data filed at 03/21/2018 0800 Gross per 24 hour  Intake 480 ml  Output 1100 ml  Net -620 ml   Weight change:  Exam:   General:  Pt is alert, intermittently follows commands appropriately, not in acute distress  HEENT: No icterus, No thrush, No neck mass, [place]/AT  Cardiovascular: RRR, S1/S2, no rubs, no gallops  Respiratory: CTA bilaterally, no wheezing, no crackles, no rhonchi  Abdomen: Soft/+BS, non tender, non distended, no guarding  Extremities: No edema, No lymphangitis, No petechiae, No rashes, no synovitis   Data Reviewed: I have personally reviewed following labs and imaging studies Basic Metabolic Panel: Recent Labs  Lab 03/17/18 1738 03/18/18 0503 03/20/18 0503  NA 138 139 135  K 3.7 3.9 4.2  CL 103 106 107  CO2 23 22 23   GLUCOSE 101* 67* 93  BUN 17 16 9   CREATININE 0.97 0.78 0.71  CALCIUM 9.6 9.0 8.4*  MG 2.2  --   --   PHOS 3.9  --   --    Liver Function Tests: Recent Labs  Lab 03/17/18 1738  AST 33  ALT 21  ALKPHOS 93  BILITOT 1.1  PROT 8.4*  ALBUMIN 4.5   No results for input(s): LIPASE, AMYLASE in the last 168 hours. Recent Labs  Lab 03/17/18 1738 03/18/18 1556  AMMONIA 13 21   Coagulation Profile: No results for input(s): INR, PROTIME in the last 168 hours. CBC: Recent Labs  Lab 03/17/18 1738 03/18/18 0503 03/20/18 0503  WBC 11.9* 9.7 7.9  NEUTROABS 8.7*  --   --   HGB 16.5 15.9 15.8  HCT 49.4 48.2 47.8  MCV 95.9 96.2 96.0  PLT 273 251 226   Cardiac Enzymes: No results for input(s): CKTOTAL, CKMB, CKMBINDEX, TROPONINI in the last 168 hours. BNP: Invalid  input(s): POCBNP CBG: No results for input(s): GLUCAP in the last 168 hours. HbA1C: No results for input(s): HGBA1C in the last 72 hours. Urine analysis:    Component Value Date/Time   COLORURINE YELLOW 03/18/2018 1910   APPEARANCEUR CLEAR 03/18/2018 1910   LABSPEC 1.018 03/18/2018 1910   PHURINE 5.0 03/18/2018 1910   GLUCOSEU NEGATIVE 03/18/2018 1910   HGBUR SMALL (A) 03/18/2018 1910   BILIRUBINUR NEGATIVE 03/18/2018 1910   KETONESUR 20 (A) 03/18/2018 1910   PROTEINUR NEGATIVE 03/18/2018 1910   NITRITE NEGATIVE 03/18/2018 1910   LEUKOCYTESUR NEGATIVE 03/18/2018 1910   Sepsis Labs: @LABRCNTIP (procalcitonin:4,lacticidven:4) )No results found for this or any previous visit (from the past 240 hour(s)).   Scheduled Meds: . aspirin  325 mg Oral Daily  . feeding supplement (ENSURE ENLIVE)  237 mL Oral TID BM  . folic acid  1 mg Oral Daily  . heparin  5,000 Units Subcutaneous Q8H  . LORazepam  0-4 mg Intravenous Q12H  . multivitamin with minerals  1 tablet Oral Daily   Continuous Infusions: . 0.9 % NaCl with KCl 20 mEq / L 125 mL/hr at 03/21/18 1230    Procedures/Studies: Ct Angio Head W Or Wo Contrast  Result Date: 03/19/2018 CLINICAL DATA:  62 y/o M; right MCA distribution stroke with altered mental status for follow-up. EXAM: CT ANGIOGRAPHY HEAD TECHNIQUE: Multidetector CT imaging of the head was performed using the standard protocol during bolus administration  of intravenous contrast. Multiplanar CT image reconstructions and MIPs were obtained to evaluate the vascular anatomy. CONTRAST:  75mL ISOVUE-370 IOPAMIDOL (ISOVUE-370) INJECTION 76% COMPARISON:  03/17/2018 CT head. 03/18/2018 MRI and MRA head. 03/18/2018 carotid ultrasound. FINDINGS: CT HEAD Brain: Stable distribution of large right posterior MCA subacute infarctions mild interval increase in edema and local mass effect from the prior study. Additionally, there are punctate density in the right paramedian high parietal  region (series 4, image 27) probably representing petechial hemorrhage. Stable chronic left parietal and bilateral anterior basal ganglia infarctions. Stable chronic microvascular ischemic changes and volume loss of the brain. No extra-axial collection, hydrocephalus, or herniation. Vascular: As below. Skull: Normal. Negative for fracture or focal lesion. Sinuses: Imaged portions are clear. Orbits: No acute finding. CTA HEAD Anterior circulation: Calcific atherosclerosis of carotid siphons with mild bilateral distal cavernous and paraclinoid stenosis. No proximal large vessel occlusion, aneurysm, or high-grade stenosis. Short segment of occlusion of a distal right MCA parietal branch (series 12, image 117 and series 15, image 12). Posterior circulation: Right P2 long segment of moderate occlusion. No additional stenosis in the posterior circulation. No large vessel occlusion, or aneurysm. No vascular malformation. Venous sinuses: As permitted by contrast timing, patent. Anatomic variants: None significant. Delayed phase: Enhancing laminar necrosis of the left parietal region of chronic infarction. No additional focus of abnormal enhancement of the brain. IMPRESSION: 1. Stable distribution of large right posterior MCA distribution infarction predominantly involving the right parietal lobe. Mild interval increase in edema and local mass effect. Petechial hemorrhage in right superior paramedian parietal lobe. 2. Distal right MCA parietal branch occlusion. 3. No large vessel occlusion, aneurysm, or proximal high-grade stenosis of the anterior or posterior intracranial circulation. 4. Carotid siphon calcific atherosclerosis with mild distal cavernous and paraclinoid stenosis. 5. Right P2 long segment of moderate stenosis. Electronically Signed   By: Mitzi Hansen M.D.   On: 03/19/2018 22:30   Ct Head Wo Contrast  Result Date: 03/17/2018 CLINICAL DATA:  Altered LOC EXAM: CT HEAD WITHOUT CONTRAST TECHNIQUE:  Contiguous axial images were obtained from the base of the skull through the vertex without intravenous contrast. COMPARISON:  None. FINDINGS: Brain: No hemorrhage or intracranial mass. Low-density edema within the right parietal lobe consistent with infarct. Low attenuation within the left parietal lobe with associated mild volume loss, suggesting prior infarct. Chronic appearing infarcts within the bilateral basal ganglia. Moderate small vessel ischemic change of the white matter. Nonenlarged ventricles. Vascular: No hyperdense vessels.  Carotid vascular calcification Skull: No fracture Sinuses/Orbits: Old appearing blowout fractures of the medial walls of both orbits. Old appearing nasal bone fracture. No acute abnormality Other: None IMPRESSION: 1. Moderate area of edema/acute to subacute infarct in the right parietal lobe without evidence for hemorrhage or midline shift. 2. Suspected chronic infarct within the left parietal lobe. Chronic infarcts within the bilateral basal ganglia with small vessel ischemic changes of the white matter. Electronically Signed   By: Jasmine Pang M.D.   On: 03/17/2018 19:26   Mr Brain Wo Contrast  Result Date: 03/18/2018 CLINICAL DATA:  Mental status changes.  Confusion. EXAM: MRI HEAD WITHOUT CONTRAST MRA HEAD WITHOUT CONTRAST TECHNIQUE: Multiplanar, multiecho pulse sequences of the brain and surrounding structures were obtained without intravenous contrast. Angiographic images of the head were obtained using MRA technique without contrast. COMPARISON:  CT 03/17/2018 FINDINGS: MRI HEAD FINDINGS Brain: The study is markedly degraded by motion. There is a 6-7 cm region of acute infarction in the right middle cerebral artery  territory affecting the posterior temporal lobe and parietal lobe. Few smaller patchy areas of acute infarction anterior to that at the frontoparietal junction region. Mild swelling but no sign of hemorrhage. Old basal ganglia infarctions bilaterally. No  sign of hydrocephalus I think there may also be acute infarction in the inferior cerebellum on the left, though this is less certain because of motion artifact. Vascular: No vascular information on this exam. Skull and upper cervical spine: No abnormality seen. Sinuses/Orbits: Negative Other: None MRA HEAD FINDINGS Marked motion degradation. Both internal carotid arteries are patent through the skull base. There is at least some flow in the middle cerebral artery branches and in the anterior cerebral arteries. Beyond that, no information is available regarding the anterior circulation. Both vertebral arteries are patent to the basilar. The basilar artery is patent. Posterior circulation branch vessels are not visible due to motion. IMPRESSION: Severely motion degraded exam. 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 Mild swelling but no evidence of hemorrhage on these limited images. Question small region of acute infarction in the inferior cerebellum on the left as well. If this is present, this would certainly elevate the likelihood of embolic disease from the heart or ascending aorta. If not a real finding, most likely source the embolus would be the right carotid bifurcation. Electronically Signed   By: Paulina Fusi M.D.   On: 03/18/2018 07:57   US Carotid Bilateral (at [facility] And [facility] Only)  Result Date: 03/18/2018 CLINICAL DATA:  Acute stroke, confusion.  Previous tobacco abuse. EXAM: BILATERAL CAROTID DUPLEX ULTRASOUND TECHNIQUE: Wallace Cullens scale imaging, color Doppler and duplex ultrasound were performed of bilateral carotid and vertebral arteries in the neck. COMPARISON:  None. FINDINGS: Criteria: Quantification of carotid stenosis is based on velocity parameters that correlate the residual internal carotid diameter with NASCET-based stenosis levels, using the diameter of the distal internal carotid lumen  as the denominator for stenosis measurement. The following velocity measurements were obtained: RIGHT ICA: 51/14 cm/sec CCA: 54/8 cm/sec SYSTOLIC ICA/CCA RATIO:  0.9 ECA: 43 cm/sec LEFT ICA: 63/14 cm/sec CCA: 44/6 cm/sec SYSTOLIC ICA/CCA RATIO:  1.4 ECA: 42 cm/sec RIGHT CAROTID ARTERY: Calcified plaque in the bulb extending to the ICA origin. No high-grade stenosis. Normal waveforms and color Doppler signal. RIGHT VERTEBRAL ARTERY:  Normal flow direction and waveform. LEFT CAROTID ARTERY: Eccentric calcified plaque in the bulb. No high-grade stenosis. Normal waveforms and color Doppler signal. LEFT VERTEBRAL ARTERY:  Normal flow direction and waveform. IMPRESSION: 1. Bilateral carotid bifurcation plaque resulting in less than 50% diameter ICA stenosis. 2.  Antegrade bilateral vertebral arterial flow. Electronically Signed   By: Corlis Leak M.D.   On: 03/18/2018 11:14   Dg Chest Port 1 View  Result Date: 03/17/2018 CLINICAL DATA:  Found wandering the streets confused, question stroke, hypertension, smoker EXAM: PORTABLE CHEST 1 VIEW COMPARISON:  Portable exam 2102 hours without priors for comparison FINDINGS: Normal heart size, mediastinal contours, and pulmonary vascularity. Lungs clear. No pleural effusion or pneumothorax. No acute osseous findings. IMPRESSION: No acute abnormalities. Electronically Signed   By: Ulyses Southward M.D.   On: 03/17/2018 21:25   Mr Maxine Glenn Head/brain [facility] Cm  Result Date: 03/18/2018 CLINICAL DATA:  Mental status changes.  Confusion. EXAM: MRI HEAD WITHOUT CONTRAST MRA HEAD WITHOUT CONTRAST TECHNIQUE: Multiplanar, multiecho pulse sequences of the brain and surrounding structures were obtained without intravenous contrast. Angiographic images of the head were  obtained using MRA technique without contrast. COMPARISON:  CT 03/17/2018 FINDINGS: MRI HEAD FINDINGS Brain: The study is markedly degraded by motion. There is a 6-7 cm region of acute infarction in the right middle cerebral artery  territory affecting the posterior temporal lobe and parietal lobe. Few smaller patchy areas of acute infarction anterior to that at the frontoparietal junction region. Mild swelling but no sign of hemorrhage. Old basal ganglia infarctions bilaterally. No sign of hydrocephalus I think there may also be acute infarction in the inferior cerebellum on the left, though this is less certain because of motion artifact. Vascular: No vascular information on this exam. Skull and upper cervical spine: No abnormality seen. Sinuses/Orbits: Negative Other: None MRA HEAD FINDINGS Marked motion degradation. Both internal carotid arteries are patent through the skull base. There is at least some flow in the middle cerebral artery branches and in the anterior cerebral arteries. Beyond that, no information is available regarding the anterior circulation. Both vertebral arteries are patent to the basilar. The basilar artery is patent. Posterior circulation branch vessels are not visible due to motion. IMPRESSION: Severely motion degraded exam. 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 Mild swelling but no evidence of hemorrhage on these limited images. Question small region of acute infarction in the inferior cerebellum on the left as well. If this is present, this would certainly elevate the likelihood of embolic disease from the heart or ascending aorta. If not a real finding, most likely source the embolus would be the right carotid bifurcation. Electronically Signed   By: Paulina Fusi M.D.   On: 03/18/2018 07:57    Catarina Hartshorn, DO  [facility] Pager [phone]  If 7PM-7AM, please contact night-coverage [email or web address] Password [email or web address] 03/21/2018, 4:52 PM   LOS: 4 days

## 2018-03-21 NOTE — Progress Notes (Signed)
Physical Therapy Treatment Patient Details Name: Adam Koch MRN: [id number] DOB: Oct 06, 1955 Today's Date: 03/21/2018    History of Present Illness  Adam Koch is a 62 y.o. male with medical history significant of alcohol abuse, tobacco use, hypertension who is coming to the emergency department due to being found by EMS and RDP wandering on the streets confused.  He is unable to provide further history.  A friend of the patient talked to EMS and stated that he drinks regularly.  He smokes daily an unknown amount of cigarettes.  He is only oriented to name and does not follow most simple commands.    PT Comments    Patient found in bed curled up, resting. Patient performed bed mobility with Mod I. Patient ambulated 50 feet with SPC with min guard and occasional stumbling. Patient initially agreed to sit up in bedside chair. However, after 2 minutes requested return to bed. Patient returned to bed with bed alarm set and all needs met. Discussed with nurse patient's mobility status and discussed with care team that patient needed condom catheter replaced. Patient would continue to benefit from skilled physical therapy to work on safety during ambulation, transfers and overall functional mobility.     Follow Up Recommendations  SNF;Supervision/Assistance - 24 hour;Supervision for mobility/OOB     Equipment Recommendations  Cane    Recommendations for Other Services       Precautions / Restrictions Precautions Precautions: Fall Restrictions Weight Bearing Restrictions: No    Mobility  Bed Mobility Overal bed mobility: Modified Independent Bed Mobility: Supine to Sit;Sit to Supine     Supine to sit: Modified independent (Device/Increase time) Sit to supine: Modified independent (Device/Increase time)      Transfers Overall transfer level: Needs assistance Equipment used: Straight cane Transfers: Sit to/from [facility] Sit to Stand: Min guard Stand pivot  transfers: Min guard       General transfer comment: slow labored movement with severe neglect left side  Ambulation/Gait Ambulation/Gait assistance: Min assist Gait Distance (Feet): 50 Feet Assistive device: Straight cane Gait Pattern/deviations: Step-to pattern;Decreased step length - right;Decreased step length - left;Decreased stride length Gait velocity: slow   General Gait Details: slow labored cadence with mostly 3 point gait pattern, demonstrated stumbling   Stairs             Wheelchair Mobility    Modified Rankin (Stroke Patients Only)       Balance Overall balance assessment: Needs assistance Sitting-balance support: Feet supported;No upper extremity supported Sitting balance-Leahy Scale: Good     Standing balance support: During functional activity;Single extremity supported Standing balance-Leahy Scale: Fair Standing balance comment: using SPC                            Cognition Arousal/Alertness: Awake/alert Behavior During Therapy: WFL for tasks assessed/performed Overall Cognitive Status: Impaired/Different from baseline Area of Impairment: Attention;Orientation;Safety/judgement;Awareness                 Orientation Level: Person Current Attention Level: Selective     Safety/Judgement: Decreased awareness of safety;Decreased awareness of deficits     General Comments: tends to neglect left side; Stated the month of his DOB, but unable to recall the rest      Exercises      General Comments        Pertinent Vitals/Pain Pain Assessment: No/denies pain    Home Living  Prior Function            PT Goals (current goals can now be found in the care plan section) Acute Rehab PT Goals Patient Stated Goal: return home PT Goal Formulation: With patient Time For Goal Achievement: 04/01/18 Potential to Achieve Goals: Good Progress towards PT goals: Progressing toward goals     Frequency    7X/week      PT Plan Current plan remains appropriate    Co-evaluation              AM-PAC PT "6 Clicks" Daily Activity  Outcome Measure  Difficulty turning over in bed (including adjusting bedclothes, sheets and blankets)?: A Little Difficulty moving from lying on back to sitting on the side of the bed? : None Difficulty sitting down on and standing up from a chair with arms (e.g., wheelchair, bedside commode, etc,.)?: A Little Help needed moving to and from a bed to chair (including a wheelchair)?: A Little Help needed walking in hospital room?: A Lot Help needed climbing 3-5 steps with a railing? : A Lot 6 Click Score: 17    End of Session Equipment Utilized During Treatment: Gait belt Activity Tolerance: Patient tolerated treatment well;Patient limited by fatigue Patient left: in bed;with call bell/phone within reach;with bed alarm set(Patient initially in chair and then requested to go to bed) Nurse Communication: Mobility status;Other (comment)(Patient's condom catheter needed to be replaced) PT Visit Diagnosis: Unsteadiness on feet (R26.81);Other abnormalities of gait and mobility (R26.89);Muscle weakness (generalized) (M62.81)     Time: 1586-8257 PT Time Calculation (min) (ACUTE ONLY): 22 min  Charges:  $Therapeutic Activity: 8-22 mins                    Clarene Critchley PT, DPT 9:11 AM, 03/21/18 [phone]

## 2018-03-22 DIAGNOSIS — E512 Wernicke's encephalopathy: Secondary | ICD-10-CM

## 2018-03-22 NOTE — Progress Notes (Signed)
Physical Therapy Treatment Patient Details Name: Adam Koch MRN: [id number] DOB: [birth year] Today's Date: 03/22/2018    History of Present Illness  Adam Koch is a 62 y.o. male with medical history significant of alcohol abuse, tobacco use, hypertension who is coming to the emergency department due to being found by EMS and RDP wandering on the streets confused.  He is unable to provide further history.  A friend of the patient talked to EMS and stated that he drinks regularly.  He smokes daily an unknown amount of cigarettes.  He is only oriented to name and does not follow most simple commands.    PT Comments    Patient found awake and alert in bed with nursing staff in room. Patient performed bed mobility mod I, and transfers with min guard and use of SPC. Patient ambulated 100 feet this session with SPC with some noted stumbling. When instructed "turn left" patient turned right, but later when instructed "turn toward me" to the left, patient turned toward the left. Patient was returned to bed, with all needs met.  Patient would benefit from continued skilled physical therapy in order to continue to progress patient towards goals of improving independence with transfers, ambulation, and overall functional mobility.    Follow Up Recommendations  SNF;Supervision/Assistance - 24 hour;Supervision for mobility/OOB     Equipment Recommendations  Cane    Recommendations for Other Services       Precautions / Restrictions Precautions Precautions: Fall Restrictions Weight Bearing Restrictions: No    Mobility  Bed Mobility Overal bed mobility: Modified Independent Bed Mobility: Supine to Sit;Sit to Supine     Supine to sit: Modified independent (Device/Increase time) Sit to supine: Modified independent (Device/Increase time)      Transfers Overall transfer level: Needs assistance Equipment used: Straight cane Transfers: Sit to/from [facility] Sit to Stand:  Min guard Stand pivot transfers: Min guard       General transfer comment: slow labored movement with severe neglect left side  Ambulation/Gait Ambulation/Gait assistance: Min assist Gait Distance (Feet): 100 Feet Assistive device: Straight cane Gait Pattern/deviations: Step-to pattern;Decreased step length - right;Decreased step length - left;Decreased stride length Gait velocity: slow   General Gait Details: slow labored cadence with mostly 3 point gait pattern, demonstrated stumbling. When directed "turn left" patient turned to the right, later when instructed to "turn toward me" on the left side, patient performed.    Stairs             Wheelchair Mobility    Modified Rankin (Stroke Patients Only)       Balance Overall balance assessment: Needs assistance Sitting-balance support: Feet supported;No upper extremity supported Sitting balance-Leahy Scale: Good     Standing balance support: During functional activity;Single extremity supported Standing balance-Leahy Scale: Fair Standing balance comment: using SPC                            Cognition Arousal/Alertness: Awake/alert Behavior During Therapy: WFL for tasks assessed/performed Overall Cognitive Status: Impaired/Different from baseline Area of Impairment: Attention;Orientation;Safety/judgement;Awareness                 Orientation Level: Person Current Attention Level: Selective     Safety/Judgement: Decreased awareness of safety;Decreased awareness of deficits     General Comments: tends to neglect left side      Exercises      General Comments        Pertinent Vitals/Pain Pain Assessment:  No/denies pain    Home Living                      Prior Function            PT Goals (current goals can now be found in the care plan section) Acute Rehab PT Goals Patient Stated Goal: return home PT Goal Formulation: With patient Time For Goal Achievement:  04/01/18 Potential to Achieve Goals: Good Progress towards PT goals: Progressing toward goals    Frequency    7X/week      PT Plan Current plan remains appropriate    Co-evaluation              AM-PAC PT "6 Clicks" Daily Activity  Outcome Measure  Difficulty turning over in bed (including adjusting bedclothes, sheets and blankets)?: A Little Difficulty moving from lying on back to sitting on the side of the bed? : None Difficulty sitting down on and standing up from a chair with arms (e.g., wheelchair, bedside commode, etc,.)?: A Little Help needed moving to and from a bed to chair (including a wheelchair)?: A Little Help needed walking in hospital room?: A Lot Help needed climbing 3-5 steps with a railing? : A Lot 6 Click Score: 17    End of Session Equipment Utilized During Treatment: Gait belt Activity Tolerance: Patient tolerated treatment well;Patient limited by fatigue Patient left: in bed;with call bell/phone within reach;with bed alarm set(patient requested return to bed) Nurse Communication: Mobility status PT Visit Diagnosis: Unsteadiness on feet (R26.81);Other abnormalities of gait and mobility (R26.89);Muscle weakness (generalized) (M62.81)     Time: 1000-1010 PT Time Calculation (min) (ACUTE ONLY): 10 min  Charges:  $Therapeutic Activity: 8-22 mins                    Clarene Critchley PT, DPT 10:47 AM, 03/22/18 [phone]

## 2018-03-22 NOTE — Progress Notes (Addendum)
PROGRESS NOTE  Adam Koch ZOX:[id number] DOB: 07-Feb-1956 DOA: 03/17/2018 PCP: Patient, No Pcp Per Brief History: 62 year old male with a history of alcohol abuse, tobacco use, hypertension presented with altered mental status. Patient is unable to provide any history secondary to his encephalopathy. Apparently, the patient was agitated and confused on the afternoon of 03/17/2018. According to his girlfriend, the patient walked outside into the backyard in his underwear and began pulling out flowers. Because of his agitation, she called the police who arrived and recommended EMS to bring the patient to the hospital. According to the patient's girlfriend, the patient was "acting drunk"on 03/15/2018.She states that she last saw him normal on 03/14/2018. She states that he frequently goes out to drink alcohol after he gets paid, although she cannot elaborate how much and how often and the patient drinks. However, she states that he has drank alcohol regularly for nearly 20 years. The patient continues to smoke tobacco although the amount is unclear. The been no reports of chest pain, shortness breath, vomiting, diarrhea, abdominal pain, chest pain. In the emergency department, the patient was afebrile hemodynamically stable saturating 100% on room air. He was only oriented to name.  Assessment/Plan: Acute metabolic encephalopathy -Multifactorial including Wernicke's encephalopathy, stroke, and possibly hypertensive encephalopathyand THC -At baseline, the patient is alert and oriented x4 -Currently the patient is alert and oriented x2 -Serum B12--589 -TSH--1.094 -Urinalysis--neg for pyuria -Folic acid--11.5 -RPR--neg -Ammonia--21 -UDS--+THC -Thiamine 500 mg q 8 hours x 6, then 100mg  daily  Acute right MCA infarct -Neurology Consultappreciated-->30 day event monitor -PT/OT evaluation-->SNF -Speech therapy eval-->regular diet with thin liquids -CT brain--Moderate area of  edema/acute to subacute infarct in the right parietal lobe without evidence for hemorrhage or midline shift -MRI brain--acute infarctR-MCA territorywith few other smaller foci in the same territory;questionable infarct in the inferior cerebellum on the left -MRA brain--motion degraded, no hemodynamically significant stenosis -Carotid Duplex--negative for hemodynamically significant stenosis -CTA H&N--no large vessel occlusion, stable large right MCA infarct, mild interval increase in edema with mild mass-effect around area of infarct. Petechial hemorrhage of right parietal lobe. Distal right MCA parietal branch occlusion. -Echo--EF 55-60%, no WMA, no PFO noted -LDL--68 -HbA1C--5.4 -Antiplatelet--ASA 325 mg -9/13--case discussed with neurology, Dr. Lurene Shadow vasogenic edema to gradually improve; No need for mannitol  Essential hypertension -Allowing for permissive hypertensionin first 24 hours -hydralazine prn SBP >220 -start amlodipine  Disposition Plan:SNFwhen bed available Family Communication:mother updatedon phone 03/22/18  Consultants:neurology  Code Status: FULL   DVT Prophylaxis: [place] Heparin    Procedures: As Listed in Progress Note Above  Antibiotics: None   Subjective: Patient denies fevers, chills, headache, chest pain, dyspnea, nausea, vomiting, diarrhea, abdominal pain, dysuria, hematuria, hematochezia, and melena.   Objective: Vitals:   03/22/18 0800 03/22/18 1200 03/22/18 1446 03/22/18 1600  BP: (!) 158/82 (!) 152/80 (!) 149/79 (!) 157/75  Pulse: 60 62 (!) 54 61  Resp: 20 16 18 16   Temp: 98.3 F (36.8 C) 97.9 F (36.6 C) 98.1 F (36.7 C) 98.1 F (36.7 C)  TempSrc:   Oral Oral  SpO2: 100% 100% 100% 100%  Weight:      Height:        Intake/Output Summary (Last 24 hours) at 03/22/2018 1656 Last data filed at 03/22/2018 0800 Gross per 24 hour  Intake 600 ml  Output 2200 ml  Net -1600 ml   Weight change:    Exam:   General:  Pt is alert, follows commands  appropriately, not in acute distress  HEENT: No icterus, No thrush, No neck mass, [place]/AT  Cardiovascular: RRR, S1/S2, no rubs, no gallops  Respiratory: CTA bilaterally, no wheezing, no crackles, no rhonchi  Abdomen: Soft/+BS, non tender, non distended, no guarding  Extremities: No edema, No lymphangitis, No petechiae, No rashes, no synovitis   Data Reviewed: I have personally reviewed following labs and imaging studies Basic Metabolic Panel: Recent Labs  Lab 03/17/18 1738 03/18/18 0503 03/20/18 0503  NA 138 139 135  K 3.7 3.9 4.2  CL 103 106 107  CO2 23 22 23   GLUCOSE 101* 67* 93  BUN 17 16 9   CREATININE 0.97 0.78 0.71  CALCIUM 9.6 9.0 8.4*  MG 2.2  --   --   PHOS 3.9  --   --    Liver Function Tests: Recent Labs  Lab 03/17/18 1738  AST 33  ALT 21  ALKPHOS 93  BILITOT 1.1  PROT 8.4*  ALBUMIN 4.5   No results for input(s): LIPASE, AMYLASE in the last 168 hours. Recent Labs  Lab 03/17/18 1738 03/18/18 1556  AMMONIA 13 21   Coagulation Profile: No results for input(s): INR, PROTIME in the last 168 hours. CBC: Recent Labs  Lab 03/17/18 1738 03/18/18 0503 03/20/18 0503  WBC 11.9* 9.7 7.9  NEUTROABS 8.7*  --   --   HGB 16.5 15.9 15.8  HCT 49.4 48.2 47.8  MCV 95.9 96.2 96.0  PLT 273 251 226   Cardiac Enzymes: No results for input(s): CKTOTAL, CKMB, CKMBINDEX, TROPONINI in the last 168 hours. BNP: Invalid input(s): POCBNP CBG: No results for input(s): GLUCAP in the last 168 hours. HbA1C: No results for input(s): HGBA1C in the last 72 hours. Urine analysis:    Component Value Date/Time   COLORURINE YELLOW 03/18/2018 1910   APPEARANCEUR CLEAR 03/18/2018 1910   LABSPEC 1.018 03/18/2018 1910   PHURINE 5.0 03/18/2018 1910   GLUCOSEU NEGATIVE 03/18/2018 1910   HGBUR SMALL (A) 03/18/2018 1910   BILIRUBINUR NEGATIVE 03/18/2018 1910   KETONESUR 20 (A) 03/18/2018 1910   PROTEINUR NEGATIVE 03/18/2018  1910   NITRITE NEGATIVE 03/18/2018 1910   LEUKOCYTESUR NEGATIVE 03/18/2018 1910   Sepsis Labs: @LABRCNTIP (procalcitonin:4,lacticidven:4) )No results found for this or any previous visit (from the past 240 hour(s)).   Scheduled Meds: . amLODipine  5 mg Oral Daily  . aspirin  325 mg Oral Daily  . feeding supplement (ENSURE ENLIVE)  237 mL Oral TID BM  . folic acid  1 mg Oral Daily  . heparin  5,000 Units Subcutaneous Q8H  . multivitamin with minerals  1 tablet Oral Daily  . thiamine  100 mg Oral Daily   Continuous Infusions:  Procedures/Studies: Ct Angio Head W Or Wo Contrast  Result Date: 03/19/2018 CLINICAL DATA:  62 y/o M; right MCA distribution stroke with altered mental status for follow-up. EXAM: CT ANGIOGRAPHY HEAD TECHNIQUE: Multidetector CT imaging of the head was performed using the standard protocol during bolus administration of intravenous contrast. Multiplanar CT image reconstructions and MIPs were obtained to evaluate the vascular anatomy. CONTRAST:  75mL ISOVUE-370 IOPAMIDOL (ISOVUE-370) INJECTION 76% COMPARISON:  03/17/2018 CT head. 03/18/2018 MRI and MRA head. 03/18/2018 carotid ultrasound. FINDINGS: CT HEAD Brain: Stable distribution of large right posterior MCA subacute infarctions mild interval increase in edema and local mass effect from the prior study. Additionally, there are punctate density in the right paramedian high parietal region (series 4, image 27) probably representing petechial hemorrhage. Stable chronic left parietal and bilateral anterior  basal ganglia infarctions. Stable chronic microvascular ischemic changes and volume loss of the brain. No extra-axial collection, hydrocephalus, or herniation. Vascular: As below. Skull: Normal. Negative for fracture or focal lesion. Sinuses: Imaged portions are clear. Orbits: No acute finding. CTA HEAD Anterior circulation: Calcific atherosclerosis of carotid siphons with mild bilateral distal cavernous and paraclinoid  stenosis. No proximal large vessel occlusion, aneurysm, or high-grade stenosis. Short segment of occlusion of a distal right MCA parietal branch (series 12, image 117 and series 15, image 12). Posterior circulation: Right P2 long segment of moderate occlusion. No additional stenosis in the posterior circulation. No large vessel occlusion, or aneurysm. No vascular malformation. Venous sinuses: As permitted by contrast timing, patent. Anatomic variants: None significant. Delayed phase: Enhancing laminar necrosis of the left parietal region of chronic infarction. No additional focus of abnormal enhancement of the brain. IMPRESSION: 1. Stable distribution of large right posterior MCA distribution infarction predominantly involving the right parietal lobe. Mild interval increase in edema and local mass effect. Petechial hemorrhage in right superior paramedian parietal lobe. 2. Distal right MCA parietal branch occlusion. 3. No large vessel occlusion, aneurysm, or proximal high-grade stenosis of the anterior or posterior intracranial circulation. 4. Carotid siphon calcific atherosclerosis with mild distal cavernous and paraclinoid stenosis. 5. Right P2 long segment of moderate stenosis. Electronically Signed   By: Mitzi Hansen M.D.   On: 03/19/2018 22:30   Ct Head Wo Contrast  Result Date: 03/17/2018 CLINICAL DATA:  Altered LOC EXAM: CT HEAD WITHOUT CONTRAST TECHNIQUE: Contiguous axial images were obtained from the base of the skull through the vertex without intravenous contrast. COMPARISON:  None. FINDINGS: Brain: No hemorrhage or intracranial mass. Low-density edema within the right parietal lobe consistent with infarct. Low attenuation within the left parietal lobe with associated mild volume loss, suggesting prior infarct. Chronic appearing infarcts within the bilateral basal ganglia. Moderate small vessel ischemic change of the white matter. Nonenlarged ventricles. Vascular: No hyperdense vessels.   Carotid vascular calcification Skull: No fracture Sinuses/Orbits: Old appearing blowout fractures of the medial walls of both orbits. Old appearing nasal bone fracture. No acute abnormality Other: None IMPRESSION: 1. Moderate area of edema/acute to subacute infarct in the right parietal lobe without evidence for hemorrhage or midline shift. 2. Suspected chronic infarct within the left parietal lobe. Chronic infarcts within the bilateral basal ganglia with small vessel ischemic changes of the white matter. Electronically Signed   By: Jasmine Pang M.D.   On: 03/17/2018 19:26   Mr Brain Wo Contrast  Result Date: 03/18/2018 CLINICAL DATA:  Mental status changes.  Confusion. EXAM: MRI HEAD WITHOUT CONTRAST MRA HEAD WITHOUT CONTRAST TECHNIQUE: Multiplanar, multiecho pulse sequences of the brain and surrounding structures were obtained without intravenous contrast. Angiographic images of the head were obtained using MRA technique without contrast. COMPARISON:  CT 03/17/2018 FINDINGS: MRI HEAD FINDINGS Brain: The study is markedly degraded by motion. There is a 6-7 cm region of acute infarction in the right middle cerebral artery territory affecting the posterior temporal lobe and parietal lobe. Few smaller patchy areas of acute infarction anterior to that at the frontoparietal junction region. Mild swelling but no sign of hemorrhage. Old basal ganglia infarctions bilaterally. No sign of hydrocephalus I think there may also be acute infarction in the inferior cerebellum on the left, though this is less certain because of motion artifact. Vascular: No vascular information on this exam. Skull and upper cervical spine: No abnormality seen. Sinuses/Orbits: Negative Other: None MRA HEAD FINDINGS Marked motion degradation. Both  internal carotid arteries are patent through the skull base. There is at least some flow in the middle cerebral artery branches and in the anterior cerebral arteries. Beyond that, no information is  available regarding the anterior circulation. Both vertebral arteries are patent to the basilar. The basilar artery is patent. Posterior circulation branch vessels are not visible due to motion. IMPRESSION: Severely motion degraded exam. 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 Mild swelling but no evidence of hemorrhage on these limited images. Question small region of acute infarction in the inferior cerebellum on the left as well. If this is present, this would certainly elevate the likelihood of embolic disease from the heart or ascending aorta. If not a real finding, most likely source the embolus would be the right carotid bifurcation. Electronically Signed   By: Paulina Fusi M.D.   On: 03/18/2018 07:57   US Carotid Bilateral (at [facility] And [facility] Only)  Result Date: 03/18/2018 CLINICAL DATA:  Acute stroke, confusion.  Previous tobacco abuse. EXAM: BILATERAL CAROTID DUPLEX ULTRASOUND TECHNIQUE: Wallace Cullens scale imaging, color Doppler and duplex ultrasound were performed of bilateral carotid and vertebral arteries in the neck. COMPARISON:  None. FINDINGS: Criteria: Quantification of carotid stenosis is based on velocity parameters that correlate the residual internal carotid diameter with NASCET-based stenosis levels, using the diameter of the distal internal carotid lumen as the denominator for stenosis measurement. The following velocity measurements were obtained: RIGHT ICA: 51/14 cm/sec CCA: 54/8 cm/sec SYSTOLIC ICA/CCA RATIO:  0.9 ECA: 43 cm/sec LEFT ICA: 63/14 cm/sec CCA: 44/6 cm/sec SYSTOLIC ICA/CCA RATIO:  1.4 ECA: 42 cm/sec RIGHT CAROTID ARTERY: Calcified plaque in the bulb extending to the ICA origin. No high-grade stenosis. Normal waveforms and color Doppler signal. RIGHT VERTEBRAL ARTERY:  Normal flow direction and waveform. LEFT CAROTID ARTERY: Eccentric calcified plaque in the bulb. No high-grade  stenosis. Normal waveforms and color Doppler signal. LEFT VERTEBRAL ARTERY:  Normal flow direction and waveform. IMPRESSION: 1. Bilateral carotid bifurcation plaque resulting in less than 50% diameter ICA stenosis. 2.  Antegrade bilateral vertebral arterial flow. Electronically Signed   By: Corlis Leak M.D.   On: 03/18/2018 11:14   Dg Chest Port 1 View  Result Date: 03/17/2018 CLINICAL DATA:  Found wandering the streets confused, question stroke, hypertension, smoker EXAM: PORTABLE CHEST 1 VIEW COMPARISON:  Portable exam 2102 hours without priors for comparison FINDINGS: Normal heart size, mediastinal contours, and pulmonary vascularity. Lungs clear. No pleural effusion or pneumothorax. No acute osseous findings. IMPRESSION: No acute abnormalities. Electronically Signed   By: Ulyses Southward M.D.   On: 03/17/2018 21:25   Mr Maxine Glenn Head/brain ZO Cm  Result Date: 03/18/2018 CLINICAL DATA:  Mental status changes.  Confusion. EXAM: MRI HEAD WITHOUT CONTRAST MRA HEAD WITHOUT CONTRAST TECHNIQUE: Multiplanar, multiecho pulse sequences of the brain and surrounding structures were obtained without intravenous contrast. Angiographic images of the head were obtained using MRA technique without contrast. COMPARISON:  CT 03/17/2018 FINDINGS: MRI HEAD FINDINGS Brain: The study is markedly degraded by motion. There is a 6-7 cm region of acute infarction in the right middle cerebral artery territory affecting the posterior temporal lobe and parietal lobe. Few smaller patchy areas of acute infarction anterior to that at the frontoparietal junction region. Mild swelling but no sign of hemorrhage. Old basal ganglia infarctions bilaterally. No sign of hydrocephalus I think there may also be acute infarction in the inferior cerebellum on  the left, though this is less certain because of motion artifact. Vascular: No vascular information on this exam. Skull and upper cervical spine: No abnormality seen. Sinuses/Orbits: Negative Other:  None MRA HEAD FINDINGS Marked motion degradation. Both internal carotid arteries are patent through the skull base. There is at least some flow in the middle cerebral artery branches and in the anterior cerebral arteries. Beyond that, no information is available regarding the anterior circulation. Both vertebral arteries are patent to the basilar. The basilar artery is patent. Posterior circulation branch vessels are not visible due to motion. IMPRESSION: Severely motion degraded exam. 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 Mild swelling but no evidence of hemorrhage on these limited images. Question small region of acute infarction in the inferior cerebellum on the left as well. If this is present, this would certainly elevate the likelihood of embolic disease from the heart or ascending aorta. If not a real finding, most likely source the embolus would be the right carotid bifurcation. Electronically Signed   By: Paulina Fusi M.D.   On: 03/18/2018 07:57    Catarina Hartshorn, DO  [facility] Pager [phone]  If 7PM-7AM, please contact night-coverage [email or web address] Password [email or web address] 03/22/2018, 4:56 PM   LOS: 5 days

## 2018-03-23 DIAGNOSIS — E512 Wernicke's encephalopathy: Secondary | ICD-10-CM

## 2018-03-23 LAB — BASIC METABOLIC PANEL
Anion gap: 8 (ref 5–15)
BUN: 13 mg/dL (ref 6–20)
CHLORIDE: 102 mmol/L (ref 98–111)
CO2: 28 mmol/L (ref 22–32)
CREATININE: 0.84 mg/dL (ref 0.61–1.24)
Calcium: 9.1 mg/dL (ref 8.9–10.3)
Glucose, Bld: 103 mg/dL — ABNORMAL HIGH (ref 70–99)
Potassium: 3.9 mmol/L (ref 3.5–5.1)
Sodium: 138 mmol/L (ref 135–145)

## 2018-03-23 LAB — MAGNESIUM: MAGNESIUM: 2.1 mg/dL (ref 1.7–2.4)

## 2018-03-23 NOTE — Progress Notes (Signed)
Physical Therapy Treatment Patient Details Name: Adam Koch MRN: [id number] DOB: 04/10/1956 Today's Date: 03/23/2018    History of Present Illness  Adam Koch is a 62 y.o. male with medical history significant of alcohol abuse, tobacco use, hypertension who is coming to the emergency department due to being found by EMS and RDP wandering on the streets confused.  He is unable to provide further history.  A friend of the patient talked to EMS and stated that he drinks regularly.  He smokes daily an unknown amount of cigarettes.  He is only oriented to name and does not follow most simple commands.    PT Comments    Pt received supine and willing to work with therapist.  Pt with noted diffculty throughout treatment to attend to task involving Lt side, turn in Lt direction with gait or complete therex with Lt LE.   Pt required max cues during session to complete activities/mobility correctly.  Completed gait without AD and no LOB.  Completed full block around nurses station, having patient complete all Lt turns during gait. Pt would turn Rt at every stop unless verbally cued to turn Lt, even when there was nowhere to go in that direction.  Pt required max verbal and tactile cues to complete therex to LE's and stayed in Rt turned posturing while in supine.  Pt requested to return supine instead of seated in recliner.  Bed alarm set    Follow Up Recommendations  SNF;Supervision/Assistance - 24 hour;Supervision for mobility/OOB           Precautions / Restrictions      Mobility  Bed Mobility Overal bed mobility: Modified Independent Bed Mobility: Sit to Supine;Supine to Sit     Supine to sit: Modified independent (Device/Increase time) Sit to supine: Modified independent (Device/Increase time)   General bed mobility comments: Pt with poor attention to Lt, difficulty following instructions for movement due to avoidance of Lt  Transfers Overall transfer level: Needs  assistance Equipment used: None     Stand pivot transfers: Min guard       General transfer comment: no LOB without AD, however tends to favor Rt side, avoiding Lt turns and direction.  Ambulation/Gait Ambulation/Gait assistance: Min guard Gait Distance (Feet): 300 Feet Assistive device: None Gait Pattern/deviations: Step-to pattern;Decreased step length - right;Decreased step length - left;Decreased stride length     General Gait Details: No LOB without AD, however tends to favor Rt side, avoiding Lt turns and direction          Cognition Arousal/Alertness: Awake/alert Behavior During Therapy: WFL for tasks assessed/performed Overall Cognitive Status: Impaired/Different from baseline Area of Impairment: Attention;Orientation;Safety/judgement;Awareness                                      Exercises General Exercises - Lower Extremity Gluteal Sets: AROM;Both;10 reps(bridge) Straight Leg Raises: AROM;Right;Left;10 reps Mini-Sqauts: AROM;Both;5 reps(sit to stands EOB without UE's)    General Comments        Pertinent Vitals/Pain Pain Assessment: No/denies pain    Home Living Family/patient expects to be discharged to:: Skilled nursing facility Living Arrangements: Alone                      PT Goals (current goals can now be found in the care plan section) Progress towards PT goals: Progressing toward goals    Frequency    7X/week  PT Plan Current plan remains appropriate       AM-PAC PT "6 Clicks" Daily Activity  Outcome Measure  Difficulty turning over in bed (including adjusting bedclothes, sheets and blankets)?: None Difficulty moving from lying on back to sitting on the side of the bed? : None Difficulty sitting down on and standing up from a chair with arms (e.g., wheelchair, bedside commode, etc,.)?: A Little Help needed moving to and from a bed to chair (including a wheelchair)?: A Little Help needed walking in  hospital room?: A Lot Help needed climbing 3-5 steps with a railing? : A Lot 6 Click Score: 18    End of Session Equipment Utilized During Treatment: Gait belt Activity Tolerance: Patient tolerated treatment well;Patient limited by fatigue Patient left: in bed;with call bell/phone within reach;with bed alarm set(patient requested return to bed) Nurse Communication: Mobility status PT Visit Diagnosis: Unsteadiness on feet (R26.81);Other abnormalities of gait and mobility (R26.89);Muscle weakness (generalized) (M62.81)     Time: 9604-5409 PT Time Calculation (min) (ACUTE ONLY): 26 min  Charges:  $Gait Training: 8-22 mins $Therapeutic Exercise: 8-22 mins                     Lurena Nida, PTA/CLT [phone]    Bascom Levels, Tauna Macfarlane B 03/23/2018, 1:16 PM

## 2018-03-23 NOTE — Progress Notes (Signed)
  Speech Language Pathology Treatment: Cognitive-Linquistic  Patient Details Name: Adam BrownsLonnie Annalucia Koch MRN: [id number] DOB: 08/23/1955 Today's Date: 03/23/2018 Time: 1211-1232 SLP Time Calculation (min) (ACUTE ONLY): 21 min  Assessment / Plan / Recommendation Clinical Impression  Pt seen for ongoing cognitive linguistic intervention during consumption of lunch meal. Pt demonstrated improved attention to his left side during functional self feeding task this date. He was able to locate 4/4 items on his tray with initial cue to "look left". Orientation, memory, and verbal expression continues to be impaired and Pt requires binary choices for most questions. He stated, "My brain just isn't right". He was able to identify that he was in a hospital and that he had a stroke. SLP provided association cues to increase orientation (Monday night football tonight will be [employer]) as Pt stated he wanted to watch football today. He continues to do well with a regular diet and thin liquids. Recommend SLP f/u at SNF for cognitive linguistic intervention.    HPI HPI: Adam [name] a 62 y.o.malewith medical history significant of alcohol abuse, tobacco use, hypertension who is coming to the emergency department due to being found by EMS and RDP wandering on the streets confused. He is unable to provide further history. A friend of the patient talked to EMS and stated that he drinks regularly. He smokes daily an unknown amount of cigarettes. He is only oriented to name and does not follow most simple commands. Severely motion degraded exam. 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 Mild swelling but no evidence of hemorrhage on these limited images. Question small region of acute infarction in the inferior cerebellum on the left as well. Pt failed RN swallow screen and RN requested BSE.       SLP Plan  Continue with current plan of care       Recommendations  Diet recommendations: Regular Liquids provided via: Cup;Straw Medication Administration: Whole meds with liquid Compensations: Minimize environmental distractions Postural Changes and/or Swallow Maneuvers: Seated upright 90 degrees;Upright 30-60 min after meal                Oral Care Recommendations: Oral care BID Follow up Recommendations: Skilled Nursing facility SLP Visit Diagnosis: Cognitive communication deficit (R41.841);Attention and concentration deficit Attention and concentration deficit following: Cerebral infarction Plan: Continue with current plan of care       Thank you,  Havery MorosDabney Merlyn Bollen, CCC-SLP [phone]                 [name] 03/23/2018, 1:15 PM

## 2018-03-23 NOTE — Progress Notes (Signed)
PROGRESS NOTE  Adam Koch BJY:[id number] DOB: 11/15/1955 DOA: 03/17/2018 PCP: Patient, No Pcp Per  Brief History: 62 year old male with a history of alcohol abuse, tobacco use, hypertension presented with altered mental status. Patient is unable to provide any history secondary to his encephalopathy. Apparently, the patient was agitated and confused on the afternoon of 03/17/2018. According to his girlfriend, the patient walked outside into the backyard in his underwear and began pulling out flowers. Because of his agitation, she called the police who arrived and recommended EMS to bring the patient to the hospital. According to the patient's girlfriend, the patient was "acting drunk"on 03/15/2018.She states that she last saw him normal on 03/14/2018. She states that he frequently goes out to drink alcohol after he gets paid, although she cannot elaborate how much and how often and the patient drinks. However, she states that he has drank alcohol regularly for nearly 20 years. The patient continues to smoke tobacco although the amount is unclear. The been no reports of chest pain, shortness breath, vomiting, diarrhea, abdominal pain, chest pain. In the emergency department, the patient was afebrile hemodynamically stable saturating 100% on room air. He was only oriented to name.  Assessment/Plan: Acute metabolic encephalopathy -Multifactorial including Wernicke's encephalopathy, stroke, and possibly hypertensive encephalopathyand THC -At baseline, the patient is alert and oriented x4 -Currently the patient is alert and oriented x2 -Serum B12--589 -TSH--1.094 -Urinalysis--neg for pyuria -Folic acid--11.5 -RPR--neg -Ammonia--21 -UDS--+THC -Thiamine 500 mg q 8 hours x 6, then 100mg  daily -slow improvement--less agitated and re-directable, but remains confused  Acute right MCA infarct -Neurology Consultappreciated-->30 day event monitor -PT/OT  evaluation-->SNF -Speech therapy eval-->regular diet with thin liquids -CT brain--Moderate area of edema/acute to subacute infarct in the right parietal lobe without evidence for hemorrhage or midline shift -MRI brain--acute infarctR-MCA territorywith few other smaller foci in the same territory;questionable infarct in the inferior cerebellum on the left -MRA brain--motion degraded, no hemodynamically significant stenosis -Carotid Duplex--negative for hemodynamically significant stenosis -CTA H&N--no large vessel occlusion, stable large right MCA infarct, mild interval increase in edema with mild mass-effect around area of infarct. Petechial hemorrhage of right parietal lobe. Distal right MCA parietal branch occlusion. -Echo--EF 55-60%, no WMA, no PFO noted -LDL--68 -HbA1C--5.4 -Antiplatelet--ASA 325 mg -9/13--case discussed with neurology, Dr. Lurene Shadow vasogenic edema to gradually improve; No need for mannitol  Essential hypertension -Allowing for permissive hypertensionin first 24 hours -hydralazine prn SBP >220 -continue amlodipine  Disposition Plan:SNFwhen bed available Family Communication:mother updatedon phone 03/22/18  Consultants:neurology  Code Status: FULL   DVT Prophylaxis: [place] Heparin    Procedures: As Listed in Progress Note Above  Antibiotics: None Subjective: Patient denies fevers, chills, headache, chest pain, dyspnea, nausea, vomiting, diarrhea, abdominal pain, dysuria, hematuria, hematochezia, and melena.   Objective: Vitals:   03/23/18 0424 03/23/18 0800 03/23/18 1200 03/23/18 1553  BP: (!) 184/94 (!) 172/86 (!) 182/88 129/84  Pulse: (!) 50 (!) 55 (!) 58 63  Resp:    14  Temp:  (!) 97.5 F (36.4 C) 98.2 F (36.8 C)   TempSrc:  Oral Oral   SpO2:  100% 100% 100%  Weight:      Height:        Intake/Output Summary (Last 24 hours) at 03/23/2018 1626 Last data filed at 03/23/2018 0900 Gross per 24 hour  Intake  480 ml  Output 800 ml  Net -320 ml   Weight change:  Exam:   General:  Pt is alert,  follows commands appropriately, not in acute distress  HEENT: No icterus, No thrush, No neck mass, [place]/AT  Cardiovascular: RRR, S1/S2, no rubs, no gallops  Respiratory: CTA bilaterally, no wheezing, no crackles, no rhonchi  Abdomen: Soft/+BS, non tender, non distended, no guarding  Extremities: No edema, No lymphangitis, No petechiae, No rashes, no synovitis   Data Reviewed: I have personally reviewed following labs and imaging studies Basic Metabolic Panel: Recent Labs  Lab 03/17/18 1738 03/18/18 0503 03/20/18 0503 03/23/18 0531  NA 138 139 135 138  K 3.7 3.9 4.2 3.9  CL 103 106 107 102  CO2 23 22 23 28   GLUCOSE 101* 67* 93 103*  BUN 17 16 9 13   CREATININE 0.97 0.78 0.71 0.84  CALCIUM 9.6 9.0 8.4* 9.1  MG 2.2  --   --  2.1  PHOS 3.9  --   --   --    Liver Function Tests: Recent Labs  Lab 03/17/18 1738  AST 33  ALT 21  ALKPHOS 93  BILITOT 1.1  PROT 8.4*  ALBUMIN 4.5   No results for input(s): LIPASE, AMYLASE in the last 168 hours. Recent Labs  Lab 03/17/18 1738 03/18/18 1556  AMMONIA 13 21   Coagulation Profile: No results for input(s): INR, PROTIME in the last 168 hours. CBC: Recent Labs  Lab 03/17/18 1738 03/18/18 0503 03/20/18 0503  WBC 11.9* 9.7 7.9  NEUTROABS 8.7*  --   --   HGB 16.5 15.9 15.8  HCT 49.4 48.2 47.8  MCV 95.9 96.2 96.0  PLT 273 251 226   Cardiac Enzymes: No results for input(s): CKTOTAL, CKMB, CKMBINDEX, TROPONINI in the last 168 hours. BNP: Invalid input(s): POCBNP CBG: No results for input(s): GLUCAP in the last 168 hours. HbA1C: No results for input(s): HGBA1C in the last 72 hours. Urine analysis:    Component Value Date/Time   COLORURINE YELLOW 03/18/2018 1910   APPEARANCEUR CLEAR 03/18/2018 1910   LABSPEC 1.018 03/18/2018 1910   PHURINE 5.0 03/18/2018 1910   GLUCOSEU NEGATIVE 03/18/2018 1910   HGBUR SMALL (A) 03/18/2018  1910   BILIRUBINUR NEGATIVE 03/18/2018 1910   KETONESUR 20 (A) 03/18/2018 1910   PROTEINUR NEGATIVE 03/18/2018 1910   NITRITE NEGATIVE 03/18/2018 1910   LEUKOCYTESUR NEGATIVE 03/18/2018 1910   Sepsis Labs: @LABRCNTIP (procalcitonin:4,lacticidven:4) )No results found for this or any previous visit (from the past 240 hour(s)).   Scheduled Meds: . amLODipine  5 mg Oral Daily  . aspirin  325 mg Oral Daily  . feeding supplement (ENSURE ENLIVE)  237 mL Oral TID BM  . folic acid  1 mg Oral Daily  . heparin  5,000 Units Subcutaneous Q8H  . multivitamin with minerals  1 tablet Oral Daily  . thiamine  100 mg Oral Daily   Continuous Infusions:  Procedures/Studies: Ct Angio Head W Or Wo Contrast  Result Date: 03/19/2018 CLINICAL DATA:  62 y/o M; right MCA distribution stroke with altered mental status for follow-up. EXAM: CT ANGIOGRAPHY HEAD TECHNIQUE: Multidetector CT imaging of the head was performed using the standard protocol during bolus administration of intravenous contrast. Multiplanar CT image reconstructions and MIPs were obtained to evaluate the vascular anatomy. CONTRAST:  75mL ISOVUE-370 IOPAMIDOL (ISOVUE-370) INJECTION 76% COMPARISON:  03/17/2018 CT head. 03/18/2018 MRI and MRA head. 03/18/2018 carotid ultrasound. FINDINGS: CT HEAD Brain: Stable distribution of large right posterior MCA subacute infarctions mild interval increase in edema and local mass effect from the prior study. Additionally, there are punctate density in the right paramedian high parietal  region (series 4, image 27) probably representing petechial hemorrhage. Stable chronic left parietal and bilateral anterior basal ganglia infarctions. Stable chronic microvascular ischemic changes and volume loss of the brain. No extra-axial collection, hydrocephalus, or herniation. Vascular: As below. Skull: Normal. Negative for fracture or focal lesion. Sinuses: Imaged portions are clear. Orbits: No acute finding. CTA HEAD Anterior  circulation: Calcific atherosclerosis of carotid siphons with mild bilateral distal cavernous and paraclinoid stenosis. No proximal large vessel occlusion, aneurysm, or high-grade stenosis. Short segment of occlusion of a distal right MCA parietal branch (series 12, image 117 and series 15, image 12). Posterior circulation: Right P2 long segment of moderate occlusion. No additional stenosis in the posterior circulation. No large vessel occlusion, or aneurysm. No vascular malformation. Venous sinuses: As permitted by contrast timing, patent. Anatomic variants: None significant. Delayed phase: Enhancing laminar necrosis of the left parietal region of chronic infarction. No additional focus of abnormal enhancement of the brain. IMPRESSION: 1. Stable distribution of large right posterior MCA distribution infarction predominantly involving the right parietal lobe. Mild interval increase in edema and local mass effect. Petechial hemorrhage in right superior paramedian parietal lobe. 2. Distal right MCA parietal branch occlusion. 3. No large vessel occlusion, aneurysm, or proximal high-grade stenosis of the anterior or posterior intracranial circulation. 4. Carotid siphon calcific atherosclerosis with mild distal cavernous and paraclinoid stenosis. 5. Right P2 long segment of moderate stenosis. Electronically Signed   By: Mitzi Hansen M.D.   On: 03/19/2018 22:30   Ct Head Wo Contrast  Result Date: 03/17/2018 CLINICAL DATA:  Altered LOC EXAM: CT HEAD WITHOUT CONTRAST TECHNIQUE: Contiguous axial images were obtained from the base of the skull through the vertex without intravenous contrast. COMPARISON:  None. FINDINGS: Brain: No hemorrhage or intracranial mass. Low-density edema within the right parietal lobe consistent with infarct. Low attenuation within the left parietal lobe with associated mild volume loss, suggesting prior infarct. Chronic appearing infarcts within the bilateral basal ganglia. Moderate  small vessel ischemic change of the white matter. Nonenlarged ventricles. Vascular: No hyperdense vessels.  Carotid vascular calcification Skull: No fracture Sinuses/Orbits: Old appearing blowout fractures of the medial walls of both orbits. Old appearing nasal bone fracture. No acute abnormality Other: None IMPRESSION: 1. Moderate area of edema/acute to subacute infarct in the right parietal lobe without evidence for hemorrhage or midline shift. 2. Suspected chronic infarct within the left parietal lobe. Chronic infarcts within the bilateral basal ganglia with small vessel ischemic changes of the white matter. Electronically Signed   By: Jasmine Pang M.D.   On: 03/17/2018 19:26   Mr Brain Wo Contrast  Result Date: 03/18/2018 CLINICAL DATA:  Mental status changes.  Confusion. EXAM: MRI HEAD WITHOUT CONTRAST MRA HEAD WITHOUT CONTRAST TECHNIQUE: Multiplanar, multiecho pulse sequences of the brain and surrounding structures were obtained without intravenous contrast. Angiographic images of the head were obtained using MRA technique without contrast. COMPARISON:  CT 03/17/2018 FINDINGS: MRI HEAD FINDINGS Brain: The study is markedly degraded by motion. There is a 6-7 cm region of acute infarction in the right middle cerebral artery territory affecting the posterior temporal lobe and parietal lobe. Few smaller patchy areas of acute infarction anterior to that at the frontoparietal junction region. Mild swelling but no sign of hemorrhage. Old basal ganglia infarctions bilaterally. No sign of hydrocephalus I think there may also be acute infarction in the inferior cerebellum on the left, though this is less certain because of motion artifact. Vascular: No vascular information on this exam. Skull and upper  cervical spine: No abnormality seen. Sinuses/Orbits: Negative Other: None MRA HEAD FINDINGS Marked motion degradation. Both internal carotid arteries are patent through the skull base. There is at least some flow in  the middle cerebral artery branches and in the anterior cerebral arteries. Beyond that, no information is available regarding the anterior circulation. Both vertebral arteries are patent to the basilar. The basilar artery is patent. Posterior circulation branch vessels are not visible due to motion. IMPRESSION: Severely motion degraded exam. 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 Mild swelling but no evidence of hemorrhage on these limited images. Question small region of acute infarction in the inferior cerebellum on the left as well. If this is present, this would certainly elevate the likelihood of embolic disease from the heart or ascending aorta. If not a real finding, most likely source the embolus would be the right carotid bifurcation. Electronically Signed   By: Paulina Fusi M.D.   On: 03/18/2018 07:57   US Carotid Bilateral (at [facility] And [facility] Only)  Result Date: 03/18/2018 CLINICAL DATA:  Acute stroke, confusion.  Previous tobacco abuse. EXAM: BILATERAL CAROTID DUPLEX ULTRASOUND TECHNIQUE: Wallace Cullens scale imaging, color Doppler and duplex ultrasound were performed of bilateral carotid and vertebral arteries in the neck. COMPARISON:  None. FINDINGS: Criteria: Quantification of carotid stenosis is based on velocity parameters that correlate the residual internal carotid diameter with NASCET-based stenosis levels, using the diameter of the distal internal carotid lumen as the denominator for stenosis measurement. The following velocity measurements were obtained: RIGHT ICA: 51/14 cm/sec CCA: 54/8 cm/sec SYSTOLIC ICA/CCA RATIO:  0.9 ECA: 43 cm/sec LEFT ICA: 63/14 cm/sec CCA: 44/6 cm/sec SYSTOLIC ICA/CCA RATIO:  1.4 ECA: 42 cm/sec RIGHT CAROTID ARTERY: Calcified plaque in the bulb extending to the ICA origin. No high-grade stenosis. Normal waveforms and color Doppler signal. RIGHT VERTEBRAL ARTERY:   Normal flow direction and waveform. LEFT CAROTID ARTERY: Eccentric calcified plaque in the bulb. No high-grade stenosis. Normal waveforms and color Doppler signal. LEFT VERTEBRAL ARTERY:  Normal flow direction and waveform. IMPRESSION: 1. Bilateral carotid bifurcation plaque resulting in less than 50% diameter ICA stenosis. 2.  Antegrade bilateral vertebral arterial flow. Electronically Signed   By: Corlis Leak M.D.   On: 03/18/2018 11:14   Dg Chest Port 1 View  Result Date: 03/17/2018 CLINICAL DATA:  Found wandering the streets confused, question stroke, hypertension, smoker EXAM: PORTABLE CHEST 1 VIEW COMPARISON:  Portable exam 2102 hours without priors for comparison FINDINGS: Normal heart size, mediastinal contours, and pulmonary vascularity. Lungs clear. No pleural effusion or pneumothorax. No acute osseous findings. IMPRESSION: No acute abnormalities. Electronically Signed   By: Ulyses Southward M.D.   On: 03/17/2018 21:25   Mr Maxine Glenn Head/brain ZO Cm  Result Date: 03/18/2018 CLINICAL DATA:  Mental status changes.  Confusion. EXAM: MRI HEAD WITHOUT CONTRAST MRA HEAD WITHOUT CONTRAST TECHNIQUE: Multiplanar, multiecho pulse sequences of the brain and surrounding structures were obtained without intravenous contrast. Angiographic images of the head were obtained using MRA technique without contrast. COMPARISON:  CT 03/17/2018 FINDINGS: MRI HEAD FINDINGS Brain: The study is markedly degraded by motion. There is a 6-7 cm region of acute infarction in the right middle cerebral artery territory affecting the posterior temporal lobe and parietal lobe. Few smaller patchy areas of acute infarction anterior to that at the frontoparietal junction region. Mild swelling but no sign of hemorrhage. Old basal ganglia infarctions bilaterally. No  sign of hydrocephalus I think there may also be acute infarction in the inferior cerebellum on the left, though this is less certain because of motion artifact. Vascular: No vascular  information on this exam. Skull and upper cervical spine: No abnormality seen. Sinuses/Orbits: Negative Other: None MRA HEAD FINDINGS Marked motion degradation. Both internal carotid arteries are patent through the skull base. There is at least some flow in the middle cerebral artery branches and in the anterior cerebral arteries. Beyond that, no information is available regarding the anterior circulation. Both vertebral arteries are patent to the basilar. The basilar artery is patent. Posterior circulation branch vessels are not visible due to motion. IMPRESSION: Severely motion degraded exam. 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 Mild swelling but no evidence of hemorrhage on these limited images. Question small region of acute infarction in the inferior cerebellum on the left as well. If this is present, this would certainly elevate the likelihood of embolic disease from the heart or ascending aorta. If not a real finding, most likely source the embolus would be the right carotid bifurcation. Electronically Signed   By: Paulina FusiMark  Shogry M.D.   On: 03/18/2018 07:57    Catarina Hartshornavid Ceanna Wareing, DO  [facility] Pager [phone]  If 7PM-7AM, please contact night-coverage [email or web address] Password [email or web address] 03/23/2018, 4:26 PM   LOS: 6 days

## 2018-03-24 NOTE — Progress Notes (Signed)
PROGRESS NOTE  Delvon Kentner ZOX:[id number] DOB: 1956-04-17 DOA: 03/17/2018 PCP: Patient, No Pcp Per  Brief History: 62 year old male with a history of alcohol abuse, tobacco use, hypertension presented with altered mental status. Patient is unable to provide any history secondary to his encephalopathy. Apparently, the patient was agitated and confused on the afternoon of 03/17/2018. According to his girlfriend, the patient walked outside into the backyard in his underwear and began pulling out flowers. Because of his agitation, she called the police who arrived and recommended EMS to bring the patient to the hospital. According to the patient's girlfriend, the patient was "acting drunk"on 03/15/2018.She states that she last saw him normal on 03/14/2018. She states that he frequently goes out to drink alcohol after he gets paid, although she cannot elaborate how much and how often and the patient drinks. However, she states that he has drank alcohol regularly for nearly 20 years. The patient continues to smoke tobacco although the amount is unclear. The been no reports of chest pain, shortness breath, vomiting, diarrhea, abdominal pain, chest pain. In the emergency department, the patient was afebrile hemodynamically stable saturating 100% on room air. He was only oriented to name.  Assessment/Plan: Acute metabolic encephalopathy -Multifactorial including Wernicke's encephalopathy, stroke, and possibly hypertensive encephalopathyand THC -At baseline, the patient is alert and oriented x4 -Currently the patient is alert and oriented x2 -Serum B12--589 -TSH--1.094 -Urinalysis--neg for pyuria -Folic acid--11.5 -RPR--neg -Ammonia--21 -UDS--+THC -Thiamine 500 mg q 8 hours x 6, then 100mg  daily -slow improvement--no longer agitated, but remains confused  Acute right MCA infarct -Neurology Consultappreciated-->30 day event monitor -PT/OT evaluation-->SNF -Speech therapy  eval-->regular diet with thin liquids -CT brain--Moderate area of edema/acute to subacute infarct in the right parietal lobe without evidence for hemorrhage or midline shift -MRI brain--acute infarctR-MCA territorywith few other smaller foci in the same territory;questionable infarct in the inferior cerebellum on the left -MRA brain--motion degraded, no hemodynamically significant stenosis -Carotid Duplex--negative for hemodynamically significant stenosis -CTA H&N--no large vessel occlusion, stable large right MCA infarct, mild interval increase in edema with mild mass-effect around area of infarct. Petechial hemorrhage of right parietal lobe. Distal right MCA parietal branch occlusion. -Echo--EF 55-60%, no WMA, no PFO noted -LDL--68 -HbA1C--5.4 -Antiplatelet--ASA 325 mg -9/13--case discussed with neurology, Dr. Lurene Shadow vasogenic edema to gradually improve; No need for mannitol  Essential hypertension -Allowed for permissive hypertensionin first 24 hours -hydralazine prn SBP >220 -continue amlodipine-->controlled  Severe Protein malnutrition -continue Ensure  Disposition Plan:SNFwhen bed available Family Communication:motherupdatedon phone 03/22/18  Consultants:neurology  Code Status: FULL   DVT Prophylaxis: Zap Heparin    Procedures: As Listed in Progress Note Above  Antibiotics: None   Subjective: Patient denies fevers, chills, headache, chest pain, dyspnea, nausea, vomiting, diarrhea, abdominal pain, dysuria, hematuria, hematochezia, and melena.   Objective: Vitals:   03/23/18 2304 03/24/18 0630 03/24/18 1113 03/24/18 1454  BP:  136/80  127/68  Pulse:  64  68  Resp:  18  16  Temp:  98.3 F (36.8 C)  98.7 F (37.1 C)  TempSrc:  Oral  Oral  SpO2: 99% 100%  100%  Weight:   62.8 kg   Height:        Intake/Output Summary (Last 24 hours) at 03/24/2018 1712 Last data filed at 03/24/2018 1500 Gross per 24 hour  Intake 720  ml  Output -  Net 720 ml   Weight change:  Exam:   General:  Pt is alert, follows  commands appropriately, not in acute distress  HEENT: No icterus, No thrush, No neck mass, [place]/AT  Cardiovascular: RRR, S1/S2, no rubs, no gallops  Respiratory: CTA bilaterally, no wheezing, no crackles, no rhonchi  Abdomen: Soft/+BS, non tender, non distended, no guarding  Extremities: No edema, No lymphangitis, No petechiae, No rashes, no synovitis   Data Reviewed: I have personally reviewed following labs and imaging studies Basic Metabolic Panel: Recent Labs  Lab 03/17/18 1738 03/18/18 0503 03/20/18 0503 03/23/18 0531  NA 138 139 135 138  K 3.7 3.9 4.2 3.9  CL 103 106 107 102  CO2 23 22 23 28   GLUCOSE 101* 67* 93 103*  BUN 17 16 9 13   CREATININE 0.97 0.78 0.71 0.84  CALCIUM 9.6 9.0 8.4* 9.1  MG 2.2  --   --  2.1  PHOS 3.9  --   --   --    Liver Function Tests: Recent Labs  Lab 03/17/18 1738  AST 33  ALT 21  ALKPHOS 93  BILITOT 1.1  PROT 8.4*  ALBUMIN 4.5   No results for input(s): LIPASE, AMYLASE in the last 168 hours. Recent Labs  Lab 03/17/18 1738 03/18/18 1556  AMMONIA 13 21   Coagulation Profile: No results for input(s): INR, PROTIME in the last 168 hours. CBC: Recent Labs  Lab 03/17/18 1738 03/18/18 0503 03/20/18 0503  WBC 11.9* 9.7 7.9  NEUTROABS 8.7*  --   --   HGB 16.5 15.9 15.8  HCT 49.4 48.2 47.8  MCV 95.9 96.2 96.0  PLT 273 251 226   Cardiac Enzymes: No results for input(s): CKTOTAL, CKMB, CKMBINDEX, TROPONINI in the last 168 hours. BNP: Invalid input(s): POCBNP CBG: No results for input(s): GLUCAP in the last 168 hours. HbA1C: No results for input(s): HGBA1C in the last 72 hours. Urine analysis:    Component Value Date/Time   COLORURINE YELLOW 03/18/2018 1910   APPEARANCEUR CLEAR 03/18/2018 1910   LABSPEC 1.018 03/18/2018 1910   PHURINE 5.0 03/18/2018 1910   GLUCOSEU NEGATIVE 03/18/2018 1910   HGBUR SMALL (A) 03/18/2018 1910    BILIRUBINUR NEGATIVE 03/18/2018 1910   KETONESUR 20 (A) 03/18/2018 1910   PROTEINUR NEGATIVE 03/18/2018 1910   NITRITE NEGATIVE 03/18/2018 1910   LEUKOCYTESUR NEGATIVE 03/18/2018 1910   Sepsis Labs: @LABRCNTIP (procalcitonin:4,lacticidven:4) )No results found for this or any previous visit (from the past 240 hour(s)).   Scheduled Meds: . amLODipine  5 mg Oral Daily  . aspirin  325 mg Oral Daily  . feeding supplement (ENSURE ENLIVE)  237 mL Oral TID BM  . folic acid  1 mg Oral Daily  . heparin  5,000 Units Subcutaneous Q8H  . multivitamin with minerals  1 tablet Oral Daily  . thiamine  100 mg Oral Daily   Continuous Infusions:  Procedures/Studies: Ct Angio Head W Or Wo Contrast  Result Date: 03/19/2018 CLINICAL DATA:  62 y/o M; right MCA distribution stroke with altered mental status for follow-up. EXAM: CT ANGIOGRAPHY HEAD TECHNIQUE: Multidetector CT imaging of the head was performed using the standard protocol during bolus administration of intravenous contrast. Multiplanar CT image reconstructions and MIPs were obtained to evaluate the vascular anatomy. CONTRAST:  75mL ISOVUE-370 IOPAMIDOL (ISOVUE-370) INJECTION 76% COMPARISON:  03/17/2018 CT head. 03/18/2018 MRI and MRA head. 03/18/2018 carotid ultrasound. FINDINGS: CT HEAD Brain: Stable distribution of large right posterior MCA subacute infarctions mild interval increase in edema and local mass effect from the prior study. Additionally, there are punctate density in the right paramedian high parietal region (  series 4, image 27) probably representing petechial hemorrhage. Stable chronic left parietal and bilateral anterior basal ganglia infarctions. Stable chronic microvascular ischemic changes and volume loss of the brain. No extra-axial collection, hydrocephalus, or herniation. Vascular: As below. Skull: Normal. Negative for fracture or focal lesion. Sinuses: Imaged portions are clear. Orbits: No acute finding. CTA HEAD Anterior  circulation: Calcific atherosclerosis of carotid siphons with mild bilateral distal cavernous and paraclinoid stenosis. No proximal large vessel occlusion, aneurysm, or high-grade stenosis. Short segment of occlusion of a distal right MCA parietal branch (series 12, image 117 and series 15, image 12). Posterior circulation: Right P2 long segment of moderate occlusion. No additional stenosis in the posterior circulation. No large vessel occlusion, or aneurysm. No vascular malformation. Venous sinuses: As permitted by contrast timing, patent. Anatomic variants: None significant. Delayed phase: Enhancing laminar necrosis of the left parietal region of chronic infarction. No additional focus of abnormal enhancement of the brain. IMPRESSION: 1. Stable distribution of large right posterior MCA distribution infarction predominantly involving the right parietal lobe. Mild interval increase in edema and local mass effect. Petechial hemorrhage in right superior paramedian parietal lobe. 2. Distal right MCA parietal branch occlusion. 3. No large vessel occlusion, aneurysm, or proximal high-grade stenosis of the anterior or posterior intracranial circulation. 4. Carotid siphon calcific atherosclerosis with mild distal cavernous and paraclinoid stenosis. 5. Right P2 long segment of moderate stenosis. Electronically Signed   By: Mitzi Hansen M.D.   On: 03/19/2018 22:30   Ct Head Wo Contrast  Result Date: 03/17/2018 CLINICAL DATA:  Altered LOC EXAM: CT HEAD WITHOUT CONTRAST TECHNIQUE: Contiguous axial images were obtained from the base of the skull through the vertex without intravenous contrast. COMPARISON:  None. FINDINGS: Brain: No hemorrhage or intracranial mass. Low-density edema within the right parietal lobe consistent with infarct. Low attenuation within the left parietal lobe with associated mild volume loss, suggesting prior infarct. Chronic appearing infarcts within the bilateral basal ganglia. Moderate  small vessel ischemic change of the white matter. Nonenlarged ventricles. Vascular: No hyperdense vessels.  Carotid vascular calcification Skull: No fracture Sinuses/Orbits: Old appearing blowout fractures of the medial walls of both orbits. Old appearing nasal bone fracture. No acute abnormality Other: None IMPRESSION: 1. Moderate area of edema/acute to subacute infarct in the right parietal lobe without evidence for hemorrhage or midline shift. 2. Suspected chronic infarct within the left parietal lobe. Chronic infarcts within the bilateral basal ganglia with small vessel ischemic changes of the white matter. Electronically Signed   By: Jasmine Pang M.D.   On: 03/17/2018 19:26   Mr Brain Wo Contrast  Result Date: 03/18/2018 CLINICAL DATA:  Mental status changes.  Confusion. EXAM: MRI HEAD WITHOUT CONTRAST MRA HEAD WITHOUT CONTRAST TECHNIQUE: Multiplanar, multiecho pulse sequences of the brain and surrounding structures were obtained without intravenous contrast. Angiographic images of the head were obtained using MRA technique without contrast. COMPARISON:  CT 03/17/2018 FINDINGS: MRI HEAD FINDINGS Brain: The study is markedly degraded by motion. There is a 6-7 cm region of acute infarction in the right middle cerebral artery territory affecting the posterior temporal lobe and parietal lobe. Few smaller patchy areas of acute infarction anterior to that at the frontoparietal junction region. Mild swelling but no sign of hemorrhage. Old basal ganglia infarctions bilaterally. No sign of hydrocephalus I think there may also be acute infarction in the inferior cerebellum on the left, though this is less certain because of motion artifact. Vascular: No vascular information on this exam. Skull and upper cervical  spine: No abnormality seen. Sinuses/Orbits: Negative Other: None MRA HEAD FINDINGS Marked motion degradation. Both internal carotid arteries are patent through the skull base. There is at least some flow in  the middle cerebral artery branches and in the anterior cerebral arteries. Beyond that, no information is available regarding the anterior circulation. Both vertebral arteries are patent to the basilar. The basilar artery is patent. Posterior circulation branch vessels are not visible due to motion. IMPRESSION: Severely motion degraded exam. 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 Mild swelling but no evidence of hemorrhage on these limited images. Question small region of acute infarction in the inferior cerebellum on the left as well. If this is present, this would certainly elevate the likelihood of embolic disease from the heart or ascending aorta. If not a real finding, most likely source the embolus would be the right carotid bifurcation. Electronically Signed   By: Paulina Fusi M.D.   On: 03/18/2018 07:57   US Carotid Bilateral (at [facility] And [facility] Only)  Result Date: 03/18/2018 CLINICAL DATA:  Acute stroke, confusion.  Previous tobacco abuse. EXAM: BILATERAL CAROTID DUPLEX ULTRASOUND TECHNIQUE: Wallace Cullens scale imaging, color Doppler and duplex ultrasound were performed of bilateral carotid and vertebral arteries in the neck. COMPARISON:  None. FINDINGS: Criteria: Quantification of carotid stenosis is based on velocity parameters that correlate the residual internal carotid diameter with NASCET-based stenosis levels, using the diameter of the distal internal carotid lumen as the denominator for stenosis measurement. The following velocity measurements were obtained: RIGHT ICA: 51/14 cm/sec CCA: 54/8 cm/sec SYSTOLIC ICA/CCA RATIO:  0.9 ECA: 43 cm/sec LEFT ICA: 63/14 cm/sec CCA: 44/6 cm/sec SYSTOLIC ICA/CCA RATIO:  1.4 ECA: 42 cm/sec RIGHT CAROTID ARTERY: Calcified plaque in the bulb extending to the ICA origin. No high-grade stenosis. Normal waveforms and color Doppler signal. RIGHT VERTEBRAL ARTERY:   Normal flow direction and waveform. LEFT CAROTID ARTERY: Eccentric calcified plaque in the bulb. No high-grade stenosis. Normal waveforms and color Doppler signal. LEFT VERTEBRAL ARTERY:  Normal flow direction and waveform. IMPRESSION: 1. Bilateral carotid bifurcation plaque resulting in less than 50% diameter ICA stenosis. 2.  Antegrade bilateral vertebral arterial flow. Electronically Signed   By: Corlis Leak M.D.   On: 03/18/2018 11:14   Dg Chest Port 1 View  Result Date: 03/17/2018 CLINICAL DATA:  Found wandering the streets confused, question stroke, hypertension, smoker EXAM: PORTABLE CHEST 1 VIEW COMPARISON:  Portable exam 2102 hours without priors for comparison FINDINGS: Normal heart size, mediastinal contours, and pulmonary vascularity. Lungs clear. No pleural effusion or pneumothorax. No acute osseous findings. IMPRESSION: No acute abnormalities. Electronically Signed   By: Ulyses Southward M.D.   On: 03/17/2018 21:25   Mr Maxine Glenn Head/brain ZO Cm  Result Date: 03/18/2018 CLINICAL DATA:  Mental status changes.  Confusion. EXAM: MRI HEAD WITHOUT CONTRAST MRA HEAD WITHOUT CONTRAST TECHNIQUE: Multiplanar, multiecho pulse sequences of the brain and surrounding structures were obtained without intravenous contrast. Angiographic images of the head were obtained using MRA technique without contrast. COMPARISON:  CT 03/17/2018 FINDINGS: MRI HEAD FINDINGS Brain: The study is markedly degraded by motion. There is a 6-7 cm region of acute infarction in the right middle cerebral artery territory affecting the posterior temporal lobe and parietal lobe. Few smaller patchy areas of acute infarction anterior to that at the frontoparietal junction region. Mild swelling but no sign of hemorrhage. Old basal ganglia infarctions bilaterally. No sign  of hydrocephalus I think there may also be acute infarction in the inferior cerebellum on the left, though this is less certain because of motion artifact. Vascular: No vascular  information on this exam. Skull and upper cervical spine: No abnormality seen. Sinuses/Orbits: Negative Other: None MRA HEAD FINDINGS Marked motion degradation. Both internal carotid arteries are patent through the skull base. There is at least some flow in the middle cerebral artery branches and in the anterior cerebral arteries. Beyond that, no information is available regarding the anterior circulation. Both vertebral arteries are patent to the basilar. The basilar artery is patent. Posterior circulation branch vessels are not visible due to motion. IMPRESSION: Severely motion degraded exam. 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 Mild swelling but no evidence of hemorrhage on these limited images. Question small region of acute infarction in the inferior cerebellum on the left as well. If this is present, this would certainly elevate the likelihood of embolic disease from the heart or ascending aorta. If not a real finding, most likely source the embolus would be the right carotid bifurcation. Electronically Signed   By: Paulina FusiMark  Shogry M.D.   On: 03/18/2018 07:57    Catarina Hartshornavid Nazire Fruth, DO  [facility] Pager [phone]  If 7PM-7AM, please contact night-coverage [email or web address] Password [email or web address] 03/24/2018, 5:12 PM   LOS: 7 days

## 2018-03-24 NOTE — Care Management Note (Signed)
Case Management Note  Patient Details  Name: Adam Koch MRN: [id number] Date of Birth: 03/21/1956   If discussed at Long Length of Stay Meetings, dates discussed:  03/24/18  Additional Comments:  Malcolm Metrohildress, Laurance Heide Demske, RN 03/24/2018, 12:50 PM

## 2018-03-24 NOTE — Progress Notes (Signed)
Physical Therapy Treatment Patient Details Name: Adam Koch MRN: [id number] DOB: 05/01/1956 Today's Date: 03/24/2018    History of Present Illness  Adam Koch is a 62 y.o. male with medical history significant of alcohol abuse, tobacco use, hypertension who is coming to the emergency department due to being found by EMS and RDP wandering on the streets confused.  He is unable to provide further history.  A friend of the patient talked to EMS and stated that he drinks regularly.  He smokes daily an unknown amount of cigarettes.  He is only oriented to name and does not follow most simple commands.    PT Comments    Patient able to ambulate without AD today with min guard 500 feet. Patient requires cues to attend to left during ambulation turning left and when performing exercises to use left extremity as well. Patient required verbal, gestural and tactile cues to turn left during ambulation. Tactile cues couple with verbal cues were most successful.  Patient would continue to benefit from skilled physical therapy in current environment and next venue to continue return to prior function and increase strength, endurance, balance, coordination, and functional mobility and gait skills.    Follow Up Recommendations  SNF;Supervision/Assistance - 24 hour;Supervision for mobility/OOB     Equipment Recommendations       Recommendations for Other Services       Precautions / Restrictions Precautions Precautions: Fall Restrictions Weight Bearing Restrictions: No    Mobility  Bed Mobility Overal bed mobility: Modified Independent Bed Mobility: Sit to Supine       Sit to supine: Modified independent (Device/Increase time)   General bed mobility comments: Patient received in recliner. Pt with poor attention to Lt, difficulty following instructions for movement to Lt  Transfers Overall transfer level: Needs assistance Equipment used: None     Stand pivot transfers: Min guard        General transfer comment: no LOB without AD, however tends to favor Rt side, avoiding Lt turns and direction despite verbal and gestural cues to left; responds to tactile cues to go left and multiple verbal attempts to go left.  Ambulation/Gait Ambulation/Gait assistance: Min guard Gait Distance (Feet): 500 Feet Assistive device: None Gait Pattern/deviations: Decreased step length - right;Decreased step length - left;Decreased stride length;Step-through pattern Gait velocity: slow   General Gait Details: No LOB without AD, however tends to favor Rt side, avoiding Lt turns and direction   Stairs             Wheelchair Mobility    Modified Rankin (Stroke Patients Only)       Balance Overall balance assessment: Needs assistance Sitting-balance support: Feet supported;No upper extremity supported Sitting balance-Leahy Scale: Good     Standing balance support: During functional activity;Single extremity supported Standing balance-Leahy Scale: Fair Standing balance comment: without AD                            Cognition Arousal/Alertness: Awake/alert Behavior During Therapy: WFL for tasks assessed/performed Overall Cognitive Status: Impaired/Different from baseline Area of Impairment: Attention;Orientation;Safety/judgement;Awareness;Memory                 Orientation Level: Person Current Attention Level: Selective Memory: Decreased short-term memory   Safety/Judgement: Decreased awareness of safety;Decreased awareness of deficits     General Comments: tends to neglect left side      Exercises General Exercises - Upper Extremity Shoulder Flexion: Strengthening;AROM;Both;10 reps;Seated General Exercises - Lower  Extremity Long Arc Quad: AROM;Strengthening;Both;10 reps;Seated Hip Flexion/Marching: Strengthening;Both;10 reps;Seated Toe Raises: Strengthening;Both;10 reps;Seated Heel Raises: Strengthening;Both;10 reps;Seated    General  Comments        Pertinent Vitals/Pain Pain Assessment: No/denies pain    Home Living                      Prior Function            PT Goals (current goals can now be found in the care plan section)      Frequency    7X/week      PT Plan Current plan remains appropriate    Co-evaluation              AM-PAC PT "6 Clicks" Daily Activity  Outcome Measure  Difficulty turning over in bed (including adjusting bedclothes, sheets and blankets)?: None Difficulty moving from lying on back to sitting on the side of the bed? : None Difficulty sitting down on and standing up from a chair with arms (e.g., wheelchair, bedside commode, etc,.)?: A Little Help needed moving to and from a bed to chair (including a wheelchair)?: A Little Help needed walking in hospital room?: A Little Help needed climbing 3-5 steps with a railing? : A Little 6 Click Score: 20    End of Session Equipment Utilized During Treatment: Gait belt Activity Tolerance: Patient tolerated treatment well;Patient limited by fatigue Patient left: in bed;with call bell/phone within reach;with bed alarm set(patient requested return to bed) Nurse Communication: Mobility status PT Visit Diagnosis: Unsteadiness on feet (R26.81);Other abnormalities of gait and mobility (R26.89);Muscle weakness (generalized) (M62.81)     Time: [id number] PT Time Calculation (min) (ACUTE ONLY): 26 min  Charges:  $Gait Training: 8-22 mins $Therapeutic Exercise: 8-22 mins                     Katina DungBarbara D. Hartnett-Rands, MS, PT [facility] #[id number] 03/24/2018, 9:38 AM

## 2018-03-24 NOTE — Clinical Social Work Note (Signed)
LCSW spoke with Hiram GashMelinda Spell with [place] [facility], Guardianship. Patient history was discussed. Ms. Glendale ChardSpell to come and assess patient today.      Aryan Bello, Juleen ChinaHeather D, LCSW

## 2018-03-24 NOTE — Clinical Social Work Note (Signed)
Late entry for 03/23/18  [facility] related to patient needing guardianship. LCSW was advised that referral did not meet criteria for APS report however the information would be given to a guardianship social worker to assess and address.    Bradan Congrove, Juleen ChinaHeather D, LCSW

## 2018-03-24 NOTE — Progress Notes (Signed)
Nutrition Follow-up  DOCUMENTATION CODES:   Severe malnutrition in context of social or environmental circumstances, Underweight  INTERVENTION:   - d/c Ensure Enlive due to pt preference  - Magic cup TID with meals, each supplement provides 290 kcal and 9 grams of protein  - Continue MVI with minerals daily  - Recommend bowel regimen if pt does not have bowel movement today as pt's last recorded bowel movement was on 9/14  NUTRITION DIAGNOSIS:   Severe Malnutrition related to social / environmental circumstances (EtOH abuse) as evidenced by moderate fat depletion, severe fat depletion, moderate muscle depletion, severe muscle depletion.  Progressing  GOAL:   Patient will meet greater than or equal to 90% of their needs  Met  MONITOR:   PO intake, Supplement acceptance, I & O's, Weight trends, Labs  REASON FOR ASSESSMENT:   Other (underweight BMI)    ASSESSMENT:   62 year old male who presented to the ED on 9/10 after being found wandering in the streets confused. PMH significant for EtOH abuse, tobacco use, hypertension. Pt found to have acute CVA.  Discussed pt with RN who endorses 100% meal completion. Per RN, pt drinks supplements ~50% of the time.  Spoke with pt at bedside who does not like Ensure supplements. RD to d/c order and order Magic Cup. Pt states he will "try" to eat the Magic Cup.  RD attempted again to obtain diet and weight history PTA. Pt states that he ate "so-so" and states "I don't know" when asked how many meals he ate daily and what he might eat at meals or snacks. Pt also unsure of his UBW. RD obtained bed scale weight at time of visit: 62.8 kg. Pt with weight of 59.7 kg on 9/11 which indicates a 6.8 lb weight gain since admission. Pt still meets criteria for underweight BMI.  Pt states, "everything is fine" and thanked RD for visit. Pt answered most questions with "yes ma'am."  Meal Completion: 100%  Medications reviewed and include: Ensure  Enlive TID, 1 mg folic acid daily, MVI with minerals daily, 100 mg thiamine daily,   Labs reviewed.  Diet Order:   Diet Order            Diet regular Room service appropriate? Yes; Fluid consistency: Thin  Diet effective now              EDUCATION NEEDS:   No education needs have been identified at this time  Skin:  Skin Assessment: Reviewed RN Assessment  Last BM:  03/21/18  Height:   Ht Readings from Last 1 Encounters:  03/18/18 6' 1" (1.854 m)    Weight:   Wt Readings from Last 1 Encounters:  03/24/18 62.8 kg    Ideal Body Weight:  83.64 kg  BMI:  Body mass index is 18.27 kg/m.  Estimated Nutritional Needs:   Kcal:  1800-2000  Protein:  90-105 grams  Fluid:  1.8-2.0 L    Gaynell Face, MS, RD, LDN Pager: [phone] Weekend/After Hours: [phone]

## 2018-03-25 NOTE — Progress Notes (Signed)
PROGRESS NOTE  Adam Koch WUJ:[id number] DOB: Mar 16, 1956 DOA: 03/17/2018 PCP: Patient, No Pcp Per  Brief History: 62 year old male with a history of alcohol abuse, tobacco use, hypertension presented with altered mental status. Patient is unable to provide any history secondary to his encephalopathy. Apparently, the patient was agitated and confused on the afternoon of 03/17/2018. According to his girlfriend, the patient walked outside into the backyard in his underwear and began pulling out flowers. Because of his agitation, she called the police who arrived and recommended EMS to bring the patient to the hospital. According to the patient's girlfriend, the patient was "acting drunk"on 03/15/2018.She states that she last saw him normal on 03/14/2018. She states that he frequently goes out to drink alcohol after he gets paid, although she cannot elaborate how much and how often and the patient drinks. However, she states that he has drank alcohol regularly for nearly 20 years. The patient continues to smoke tobacco although the amount is unclear. The been no reports of chest pain, shortness breath, vomiting, diarrhea, abdominal pain, chest pain. In the emergency department, the patient was afebrile hemodynamically stable saturating 100% on room air. He was only oriented to name.  Assessment/Plan: Acute metabolic encephalopathy -Multifactorial including Wernicke's encephalopathy, stroke, and possibly hypertensive encephalopathyand THC -At baseline, the patient is alert and oriented x4 -Currently the patient is alert and oriented x2 -Serum B12--589 -TSH--1.094 -Urinalysis--neg for pyuria -Folic acid--11.5 -RPR--neg -Ammonia--21 -UDS--+THC -Thiamine 500 mg q 8 hours x 6, then 100mg  daily -slow improvement--no longer agitated, but remains confused  Acute right MCA infarct -Neurology Consultappreciated-->30 day event monitor -PT/OT evaluation-->SNF -Speech therapy  eval-->regular diet with thin liquids -CT brain--Moderate area of edema/acute to subacute infarct in the right parietal lobe without evidence for hemorrhage or midline shift -MRI brain--acute infarctR-MCA territorywith few other smaller foci in the same territory;questionable infarct in the inferior cerebellum on the left -MRA brain--motion degraded, no hemodynamically significant stenosis -Carotid Duplex--negative for hemodynamically significant stenosis -CTA H&N--no large vessel occlusion, stable large right MCA infarct, mild interval increase in edema with mild mass-effect around area of infarct. Petechial hemorrhage of right parietal lobe. Distal right MCA parietal branch occlusion. -Echo--EF 55-60%, no WMA, no PFO noted -LDL--68 -HbA1C--5.4 -Antiplatelet--ASA 325 mg -9/13--case discussed by Dr. Arbutus Leas with neurology, Dr. Lurene Shadow vasogenic edema to gradually improve; No need for mannitol  Essential hypertension -Allowed for permissive hypertensionin first 24 hours -hydralazine prn SBP >220 -continue amlodipine-->controlled  Severe Protein malnutrition -continue Ensure  Disposition Plan:SNFwhen bed available Family Communication:motherupdatedon phone 03/22/18  Consultants:neurology  Code Status: FULL   DVT Prophylaxis: [place] Heparin    Procedures: As Listed in Progress Note Above  Antibiotics: None   Subjective: Confused. Denies any complaints.   Objective: Vitals:   03/24/18 2114 03/24/18 2204 03/25/18 0635 03/25/18 1518  BP:  116/62 112/73 140/79  Pulse:  83 (!) 59 70  Resp:  18 18 18   Temp:  98.3 F (36.8 C) 97.8 F (36.6 C) 98.7 F (37.1 C)  TempSrc:   Oral Oral  SpO2: 97% 100% 100% 100%  Weight:      Height:        Intake/Output Summary (Last 24 hours) at 03/25/2018 1910 Last data filed at 03/25/2018 1750 Gross per 24 hour  Intake 960 ml  Output -  Net 960 ml   Weight change:  Exam:  General exam: Alert,  awake Respiratory system: Clear to auscultation. Respiratory effort normal. Cardiovascular system:RRR. No  murmurs, rubs, gallops. Gastrointestinal system: Abdomen is nondistended, soft and nontender. No organomegaly or masses felt. Normal bowel sounds heard. Central nervous system: No focal neurological deficits. Extremities: No C/C/E, +pedal pulses Skin: No rashes, lesions or ulcers  Psychiatry: pleasantly confused   Data Reviewed: I have personally reviewed following labs and imaging studies Basic Metabolic Panel: Recent Labs  Lab 03/20/18 0503 03/23/18 0531  NA 135 138  K 4.2 3.9  CL 107 102  CO2 23 28  GLUCOSE 93 103*  BUN 9 13  CREATININE 0.71 0.84  CALCIUM 8.4* 9.1  MG  --  2.1   Liver Function Tests: No results for input(s): AST, ALT, ALKPHOS, BILITOT, PROT, ALBUMIN in the last 168 hours. No results for input(s): LIPASE, AMYLASE in the last 168 hours. No results for input(s): AMMONIA in the last 168 hours. Coagulation Profile: No results for input(s): INR, PROTIME in the last 168 hours. CBC: Recent Labs  Lab 03/20/18 0503  WBC 7.9  HGB 15.8  HCT 47.8  MCV 96.0  PLT 226   Cardiac Enzymes: No results for input(s): CKTOTAL, CKMB, CKMBINDEX, TROPONINI in the last 168 hours. BNP: Invalid input(s): POCBNP CBG: No results for input(s): GLUCAP in the last 168 hours. HbA1C: No results for input(s): HGBA1C in the last 72 hours. Urine analysis:    Component Value Date/Time   COLORURINE YELLOW 03/18/2018 1910   APPEARANCEUR CLEAR 03/18/2018 1910   LABSPEC 1.018 03/18/2018 1910   PHURINE 5.0 03/18/2018 1910   GLUCOSEU NEGATIVE 03/18/2018 1910   HGBUR SMALL (A) 03/18/2018 1910   BILIRUBINUR NEGATIVE 03/18/2018 1910   KETONESUR 20 (A) 03/18/2018 1910   PROTEINUR NEGATIVE 03/18/2018 1910   NITRITE NEGATIVE 03/18/2018 1910   LEUKOCYTESUR NEGATIVE 03/18/2018 1910   Sepsis Labs: @LABRCNTIP (procalcitonin:4,lacticidven:4) )No results found for this or any  previous visit (from the past 240 hour(s)).   Scheduled Meds: . amLODipine  5 mg Oral Daily  . aspirin  325 mg Oral Daily  . feeding supplement (ENSURE ENLIVE)  237 mL Oral TID BM  . folic acid  1 mg Oral Daily  . heparin  5,000 Units Subcutaneous Q8H  . multivitamin with minerals  1 tablet Oral Daily  . thiamine  100 mg Oral Daily   Continuous Infusions:  Procedures/Studies: Ct Angio Head W Or Wo Contrast  Result Date: 03/19/2018 CLINICAL DATA:  62 y/o M; right MCA distribution stroke with altered mental status for follow-up. EXAM: CT ANGIOGRAPHY HEAD TECHNIQUE: Multidetector CT imaging of the head was performed using the standard protocol during bolus administration of intravenous contrast. Multiplanar CT image reconstructions and MIPs were obtained to evaluate the vascular anatomy. CONTRAST:  75mL ISOVUE-370 IOPAMIDOL (ISOVUE-370) INJECTION 76% COMPARISON:  03/17/2018 CT head. 03/18/2018 MRI and MRA head. 03/18/2018 carotid ultrasound. FINDINGS: CT HEAD Brain: Stable distribution of large right posterior MCA subacute infarctions mild interval increase in edema and local mass effect from the prior study. Additionally, there are punctate density in the right paramedian high parietal region (series 4, image 27) probably representing petechial hemorrhage. Stable chronic left parietal and bilateral anterior basal ganglia infarctions. Stable chronic microvascular ischemic changes and volume loss of the brain. No extra-axial collection, hydrocephalus, or herniation. Vascular: As below. Skull: Normal. Negative for fracture or focal lesion. Sinuses: Imaged portions are clear. Orbits: No acute finding. CTA HEAD Anterior circulation: Calcific atherosclerosis of carotid siphons with mild bilateral distal cavernous and paraclinoid stenosis. No proximal large vessel occlusion, aneurysm, or high-grade stenosis. Short segment of occlusion of a  distal right MCA parietal branch (series 12, image 117 and series 15,  image 12). Posterior circulation: Right P2 long segment of moderate occlusion. No additional stenosis in the posterior circulation. No large vessel occlusion, or aneurysm. No vascular malformation. Venous sinuses: As permitted by contrast timing, patent. Anatomic variants: None significant. Delayed phase: Enhancing laminar necrosis of the left parietal region of chronic infarction. No additional focus of abnormal enhancement of the brain. IMPRESSION: 1. Stable distribution of large right posterior MCA distribution infarction predominantly involving the right parietal lobe. Mild interval increase in edema and local mass effect. Petechial hemorrhage in right superior paramedian parietal lobe. 2. Distal right MCA parietal branch occlusion. 3. No large vessel occlusion, aneurysm, or proximal high-grade stenosis of the anterior or posterior intracranial circulation. 4. Carotid siphon calcific atherosclerosis with mild distal cavernous and paraclinoid stenosis. 5. Right P2 long segment of moderate stenosis. Electronically Signed   By: Mitzi Hansen M.D.   On: 03/19/2018 22:30   Ct Head Wo Contrast  Result Date: 03/17/2018 CLINICAL DATA:  Altered LOC EXAM: CT HEAD WITHOUT CONTRAST TECHNIQUE: Contiguous axial images were obtained from the base of the skull through the vertex without intravenous contrast. COMPARISON:  None. FINDINGS: Brain: No hemorrhage or intracranial mass. Low-density edema within the right parietal lobe consistent with infarct. Low attenuation within the left parietal lobe with associated mild volume loss, suggesting prior infarct. Chronic appearing infarcts within the bilateral basal ganglia. Moderate small vessel ischemic change of the white matter. Nonenlarged ventricles. Vascular: No hyperdense vessels.  Carotid vascular calcification Skull: No fracture Sinuses/Orbits: Old appearing blowout fractures of the medial walls of both orbits. Old appearing nasal bone fracture. No acute  abnormality Other: None IMPRESSION: 1. Moderate area of edema/acute to subacute infarct in the right parietal lobe without evidence for hemorrhage or midline shift. 2. Suspected chronic infarct within the left parietal lobe. Chronic infarcts within the bilateral basal ganglia with small vessel ischemic changes of the white matter. Electronically Signed   By: Jasmine Pang M.D.   On: 03/17/2018 19:26   Mr Brain Wo Contrast  Result Date: 03/18/2018 CLINICAL DATA:  Mental status changes.  Confusion. EXAM: MRI HEAD WITHOUT CONTRAST MRA HEAD WITHOUT CONTRAST TECHNIQUE: Multiplanar, multiecho pulse sequences of the brain and surrounding structures were obtained without intravenous contrast. Angiographic images of the head were obtained using MRA technique without contrast. COMPARISON:  CT 03/17/2018 FINDINGS: MRI HEAD FINDINGS Brain: The study is markedly degraded by motion. There is a 6-7 cm region of acute infarction in the right middle cerebral artery territory affecting the posterior temporal lobe and parietal lobe. Few smaller patchy areas of acute infarction anterior to that at the frontoparietal junction region. Mild swelling but no sign of hemorrhage. Old basal ganglia infarctions bilaterally. No sign of hydrocephalus I think there may also be acute infarction in the inferior cerebellum on the left, though this is less certain because of motion artifact. Vascular: No vascular information on this exam. Skull and upper cervical spine: No abnormality seen. Sinuses/Orbits: Negative Other: None MRA HEAD FINDINGS Marked motion degradation. Both internal carotid arteries are patent through the skull base. There is at least some flow in the middle cerebral artery branches and in the anterior cerebral arteries. Beyond that, no information is available regarding the anterior circulation. Both vertebral arteries are patent to the basilar. The basilar artery is patent. Posterior circulation branch vessels are not visible  due to motion. IMPRESSION: Severely motion degraded exam. 6-7 cm region of acute infarction  affecting the right middle cerebral artery territory, with a few other smaller foci of infarction in that territory. Mild swelling but no evidence of hemorrhage on these limited images. Question small region of acute infarction in the inferior cerebellum on the left as well. If this is present, this would certainly elevate the likelihood of embolic disease from the heart or ascending aorta. If not a real finding, most likely source the embolus would be the right carotid bifurcation. Electronically Signed   By: Paulina FusiMark  Shogry M.D.   On: 03/18/2018 07:57   [place] Carotid Bilateral (at [facility] And [facility] Only)  Result Date: 03/18/2018 CLINICAL DATA:  Acute stroke, confusion.  Previous tobacco abuse. EXAM: BILATERAL CAROTID DUPLEX ULTRASOUND TECHNIQUE: Wallace CullensGray scale imaging, color Doppler and duplex ultrasound were performed of bilateral carotid and vertebral arteries in the neck. COMPARISON:  None. FINDINGS: Criteria: Quantification of carotid stenosis is based on velocity parameters that correlate the residual internal carotid diameter with NASCET-based stenosis levels, using the diameter of the distal internal carotid lumen as the denominator for stenosis measurement. The following velocity measurements were obtained: RIGHT ICA: 51/14 cm/sec CCA: 54/8 cm/sec SYSTOLIC ICA/CCA RATIO:  0.9 ECA: 43 cm/sec LEFT ICA: 63/14 cm/sec CCA: 44/6 cm/sec SYSTOLIC ICA/CCA RATIO:  1.4 ECA: 42 cm/sec RIGHT CAROTID ARTERY: Calcified plaque in the bulb extending to the ICA origin. No high-grade stenosis. Normal waveforms and color Doppler signal. RIGHT VERTEBRAL ARTERY:  Normal flow direction and waveform. LEFT CAROTID ARTERY: Eccentric calcified plaque in the bulb. No high-grade stenosis. Normal waveforms and color Doppler signal. LEFT VERTEBRAL ARTERY:  Normal flow direction and waveform. IMPRESSION: 1. Bilateral carotid bifurcation plaque resulting in  less than 50% diameter ICA stenosis. 2.  Antegrade bilateral vertebral arterial flow. Electronically Signed   By: Corlis Leak  Hassell M.D.   On: 03/18/2018 11:14   Dg Chest Port 1 View  Result Date: 03/17/2018 CLINICAL DATA:  Found wandering the streets confused, question stroke, hypertension, smoker EXAM: PORTABLE CHEST 1 VIEW COMPARISON:  Portable exam 2102 hours without priors for comparison FINDINGS: Normal heart size, mediastinal contours, and pulmonary vascularity. Lungs clear. No pleural effusion or pneumothorax. No acute osseous findings. IMPRESSION: No acute abnormalities. Electronically Signed   By: Ulyses SouthwardMark  Boles M.D.   On: 03/17/2018 21:25   Mr Maxine GlennMra Head/brain ZOWo Cm  Result Date: 03/18/2018 CLINICAL DATA:  Mental status changes.  Confusion. EXAM: MRI HEAD WITHOUT CONTRAST MRA HEAD WITHOUT CONTRAST TECHNIQUE: Multiplanar, multiecho pulse sequences of the brain and surrounding structures were obtained without intravenous contrast. Angiographic images of the head were obtained using MRA technique without contrast. COMPARISON:  CT 03/17/2018 FINDINGS: MRI HEAD FINDINGS Brain: The study is markedly degraded by motion. There is a 6-7 cm region of acute infarction in the right middle cerebral artery territory affecting the posterior temporal lobe and parietal lobe. Few smaller patchy areas of acute infarction anterior to that at the frontoparietal junction region. Mild swelling but no sign of hemorrhage. Old basal ganglia infarctions bilaterally. No sign of hydrocephalus I think there may also be acute infarction in the inferior cerebellum on the left, though this is less certain because of motion artifact. Vascular: No vascular information on this exam. Skull and upper cervical spine: No abnormality seen. Sinuses/Orbits: Negative Other: None MRA HEAD FINDINGS Marked motion degradation. Both internal carotid arteries are patent through the skull base. There is at least some flow in the middle cerebral artery  branches and in the anterior cerebral arteries. Beyond that, no information is available regarding  the anterior circulation. Both vertebral arteries are patent to the basilar. The basilar artery is patent. Posterior circulation branch vessels are not visible due to motion. IMPRESSION: Severely motion degraded exam. 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 Mild swelling but no evidence of hemorrhage on these limited images. Question small region of acute infarction in the inferior cerebellum on the left as well. If this is present, this would certainly elevate the likelihood of embolic disease from the heart or ascending aorta. If not a real finding, most likely source the embolus would be the right carotid bifurcation. Electronically Signed   By: Paulina Fusi M.D.   On: 03/18/2018 07:57    Erick Blinks, MD  [facility] Pager [phone]  If 7PM-7AM, please contact night-coverage [email or web address] Password [email or web address] 03/25/2018, 7:10 PM   LOS: 8 days

## 2018-03-25 NOTE — Clinical Social Work Note (Signed)
Late entry for 03/24/18  [facility] and another guardianship social worker met with patient. They indicated that they had to assess and investigate patient's resources, however it appears that patient will need guardianship.      Danylle Ouk, Clydene Pugh, LCSW

## 2018-03-25 NOTE — Progress Notes (Signed)
Physical Therapy Treatment Patient Details Name: Ethelene BrownsLonnie Xia MRN: [id number] DOB: 01/20/1956 Today's Date: 03/25/2018    History of Present Illness  Kairee Rena is a 62 y.o. male with medical history significant of alcohol abuse, tobacco use, hypertension who is coming to the emergency department due to being found by EMS and RDP wandering on the streets confused.  He is unable to provide further history.  A friend of the patient talked to EMS and stated that he drinks regularly.  He smokes daily an unknown amount of cigarettes.  He is only oriented to name and does not follow most simple commands.    PT Comments    Patient presents up in chair, agreeable for therapy, demonstrates fair return for completing BLE exercises with frequent verbal cueing to also do with LLE secondary to neglect, had poor follow through for completing seated toe/heel raises, able to complete LAQ's, marching in place with LLE consistently, but had difficulty with alternating movement.  Patient demonstrated slow cadence during gait training in hallways without loss of balance, continues to having difficulty turning to the left when requested due to neglect.  Patient will benefit from continued physical therapy in hospital and recommended venue below to increase strength, balance, endurance for safe ADLs and gait.    Follow Up Recommendations  SNF;Supervision/Assistance - 24 hour;Supervision for mobility/OOB     Equipment Recommendations  Cane    Recommendations for Other Services       Precautions / Restrictions Precautions Precautions: Fall Precaution Comments: left side physical and visual neglect Restrictions Weight Bearing Restrictions: No    Mobility  Bed Mobility Overal bed mobility: Modified Independent                Transfers Overall transfer level: Needs assistance Equipment used: None Transfers: Sit to/from Stand;Stand Pivot Transfers Sit to Stand: Supervision Stand pivot  transfers: Supervision          Ambulation/Gait Ambulation/Gait assistance: Min guard Gait Distance (Feet): 200 Feet Assistive device: Straight cane Gait Pattern/deviations: Step-to pattern;Decreased step length - right;Decreased step length - left;Decreased stride length Gait velocity: slow   General Gait Details: slow slightly labored cadencee with mostly 3 point gait pattern using RW, requires repeated verbal cueing when having to turn to the left, no loss of balance   Stairs             Wheelchair Mobility    Modified Rankin (Stroke Patients Only)       Balance Overall balance assessment: Needs assistance Sitting-balance support: Feet supported;No upper extremity supported Sitting balance-Leahy Scale: Good     Standing balance support: During functional activity;Single extremity supported Standing balance-Leahy Scale: Fair                              Cognition Arousal/Alertness: Awake/alert Behavior During Therapy: WFL for tasks assessed/performed;Flat affect Overall Cognitive Status: Impaired/Different from baseline Area of Impairment: Attention;Orientation;Safety/judgement;Awareness;Memory                 Orientation Level: Person Current Attention Level: Selective Memory: Decreased short-term memory   Safety/Judgement: Decreased awareness of safety;Decreased awareness of deficits     General Comments: tends to neglect left side      Exercises General Exercises - Lower Extremity Long Arc Quad: AROM;Strengthening;Both;10 reps;Seated Hip Flexion/Marching: Seated;AROM;Strengthening;Both;10 reps Toe Raises: Seated;AROM;Strengthening;Both;10 reps Heel Raises: Seated;AROM;Strengthening;Both;10 reps    General Comments        Pertinent Vitals/Pain Pain Assessment: No/denies  pain    Home Living                      Prior Function            PT Goals (current goals can now be found in the care plan section) Acute  Rehab PT Goals Patient Stated Goal: return home PT Goal Formulation: With patient Time For Goal Achievement: 04/01/18 Potential to Achieve Goals: Good Progress towards PT goals: Progressing toward goals    Frequency    7X/week      PT Plan Current plan remains appropriate    Co-evaluation              AM-PAC PT "6 Clicks" Daily Activity  Outcome Measure  Difficulty turning over in bed (including adjusting bedclothes, sheets and blankets)?: None Difficulty moving from lying on back to sitting on the side of the bed? : None Difficulty sitting down on and standing up from a chair with arms (e.g., wheelchair, bedside commode, etc,.)?: None Help needed moving to and from a bed to chair (including a wheelchair)?: A Little Help needed walking in hospital room?: A Little Help needed climbing 3-5 steps with a railing? : A Lot 6 Click Score: 20    End of Session   Activity Tolerance: Patient tolerated treatment well;Patient limited by fatigue Patient left: in bed;with call bell/phone within reach;with bed alarm set Nurse Communication: Mobility status PT Visit Diagnosis: Unsteadiness on feet (R26.81);Other abnormalities of gait and mobility (R26.89);Muscle weakness (generalized) (M62.81)     Time: 4034-7425 PT Time Calculation (min) (ACUTE ONLY): 31 min  Charges:  $Gait Training: 8-22 mins $Therapeutic Exercise: 8-22 mins                     12:12 PM, 03/25/18 Ocie Bob, MPT Physical Therapist with [facility] [phone] office [phone] mobile phone

## 2018-03-25 NOTE — Progress Notes (Signed)
  Speech Language Pathology Treatment: Cognitive-Linquistic  Patient Details Name: Adam Koch MRN: [id number] DOB: 07/23/1955 Today's Date: 03/25/2018 Time: [id number] SLP Time Calculation (min) (ACUTE ONLY): 34 min  Assessment / Plan / Recommendation Clinical Impression  Pt alert and cooperative during cognitive linguistic treatment this date. He was received in bed watching the Joni FearsJerry Springer show. Left neglect persists, however he was noted to turn his head toward SLP sitting on his left side more today. Pt verbalizes that he is right handed. He continues to exhibit disorientation and expressive language deficits. Pt verbalizes automatic sequences (counting, days of the week) with initial cue, but has difficulty with responsive and confrontation naming tasks with suspected dysnomia and possible apraxia. Pt was able to name items in a category when provided phonemic cues, but then often stated, "My mind is not working right". Pt engaged in conversation regarding [employer] teams and players, but required cueing for names. He was aware that Berenda MoraleKevin Durant was injured last season and was traded to another team. Recommend continued cognitive linguistic therapy in acute setting and at next level of care.     HPI HPI: Adam [name] a 62 y.o.malewith medical history significant of alcohol abuse, tobacco use, hypertension who is coming to the emergency department due to being found by EMS and RDP wandering on the streets confused. He is unable to provide further history. A friend of the patient talked to EMS and stated that he drinks regularly. He smokes daily an unknown amount of cigarettes. He is only oriented to name and does not follow most simple commands. Severely motion degraded exam. 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 Mild swelling but no evidence of hemorrhage on these limited images.  Question small region of acute infarction in the inferior cerebellum on the left as well. Pt failed RN swallow screen and RN requested BSE.      SLP Plan  Continue with current plan of care       Recommendations                   SLP Visit Diagnosis: Cognitive communication deficit (J18.841(R41.841) Plan: Continue with current plan of care       Thank you,  Havery MorosDabney Porter, CCC-SLP [phone]                 PORTER,DABNEY 03/25/2018, 3:56 PM

## 2018-03-25 NOTE — Progress Notes (Signed)
Occupational Therapy Treatment Patient Details Name: Adam Koch MRN: [id number] DOB: Aug 31, 1955 Today's Date: 03/25/2018    History of present illness  Adam Koch is a 62 y.o. male with medical history significant of alcohol abuse, tobacco use, hypertension who is coming to the emergency department due to being found by EMS and RDP wandering on the streets confused.  He is unable to provide further history.  A friend of the patient talked to EMS and stated that he drinks regularly.  He smokes daily an unknown amount of cigarettes.  He is only oriented to name and does not follow most simple commands.   OT comments  Pt in bed upon therapy arrival and agreeable to participate in OT treatment session. Patient completed toileting while standing at toilet followed by hand hygiene while standing at sink. During hand washing, he required cueing for sequencing of steps. When seated in recliner, he complete oral care with therapist providing him with supplies. Pt was able to complete steps of teeth brushing including ste up of toothbrush with paste without cueing although once therapist requested that he pour water from his bottle into his empty cup he instead took his water bottle and drank from it presenting with ideational apraxia. He then was unable to complete any verbally requested commands from therapist. Patient also presents with Anomic Aphasia as he was unable to name every day objects presented to him. In addition to the inability to name the object, patient was unable to verbalize what he might do with the item and instead was able to show me.            Precautions / Restrictions Precautions Precautions: Fall Precaution Comments: left side physical and visual neglect Restrictions Weight Bearing Restrictions: No              ADL either performed or assessed with clinical judgement   ADL Overall ADL's : Needs assistance/impaired     Grooming: Wash/dry hands;Oral  care;Sitting;Supervision/safety;Set up                   Toilet Transfer: Solicitor           Functional mobility during ADLs: Min guard General ADL Comments: Pt requiring verbal cuing and sequencing for follow through            Praxis Praxis Praxis-Other Comments: Patient presents with sympathetic and ideational/conceptual apraxia during oral care task    Cognition Arousal/Alertness: Awake/alert Behavior During Therapy: Flat affect Overall Cognitive Status: Impaired/Different from baseline                      Pertinent Vitals/ Pain       Pain Assessment: No/denies pain   Progress Toward Goals  OT Goals(current goals can now be found in the care plan section)  Progress towards OT goals: Progressing toward goals     Plan Discharge plan remains appropriate;Frequency remains appropriate          End of Session Equipment Utilized During Treatment: Gait belt  OT Visit Diagnosis: Muscle weakness (generalized) (M62.81);Other symptoms and signs involving cognitive function   Activity Tolerance Patient tolerated treatment well   Patient Left in chair;with call bell/phone within reach;with chair alarm set   Nurse Communication Other (comment)(Patient up in recliner. Will require occasional check-ins due to impaired safey awareness.)        Time: 2130-8657 OT Time Calculation (min): 25 min  Charges: OT Treatments $Self Care/Home Management : 23-37 mins  Limmie PatriciaLaura Cacie Gaskins, OTR/L,CBIS  [phone]    Adelai Achey, Vernona RiegerLaura D 03/25/2018, 10:40 AM

## 2018-03-26 NOTE — Progress Notes (Signed)
Physical Therapy Treatment Patient Details Name: Adam Koch MRN: [id number] DOB: 17-Aug-1955 Today's Date: 03/26/2018    History of Present Illness  Adam Koch is a 62 y.o. male with medical history significant of alcohol abuse, tobacco use, hypertension who is coming to the emergency department due to being found by EMS and RDP wandering on the streets confused.  He is unable to provide further history.  A friend of the patient talked to EMS and stated that he drinks regularly.  He smokes daily an unknown amount of cigarettes.  He is only oriented to name and does not follow most simple commands.    PT Comments    Patient demonstrates improvement for ambulation without use of an AD, able to ambulate in hallway on level, inclined and declined surfaces without loss of balance, had slow cadence for going up/down ramps without loss of balance.  Patient continues to have left sided neglect requiring repeated verbal cueing when having to make a left turn in hallway or into his room.  Patient tolerated sitting up in chair to eat lunch - RN notified.  Patient will benefit from continued physical therapy in hospital and recommended venue below to increase strength, balance, endurance for safe ADLs and gait.   Follow Up Recommendations  SNF;Supervision/Assistance - 24 hour;Supervision for mobility/OOB     Equipment Recommendations  Cane    Recommendations for Other Services       Precautions / Restrictions Precautions Precautions: Fall Precaution Comments: left side physical and visual neglect Restrictions Weight Bearing Restrictions: No    Mobility  Bed Mobility Overal bed mobility: Modified Independent Bed Mobility: Supine to Sit           General bed mobility comments: Needed physical cues to initiate action as he was unable to complete with a visual or verbal cue.   Transfers Overall transfer level: Modified independent                   Ambulation/Gait Ambulation/Gait assistance: Min guard Gait Distance (Feet): 250 Feet Assistive device: None Gait Pattern/deviations: Decreased step length - right;Decreased step length - left;Decreased stride length Gait velocity: decreased   General Gait Details: demonstrates improvement in arm swing when not using cane, continues to have short steps/stride length, no loss of balance on level, inclined or declined surfaces, had to slow pace when going up/down ramps in hallway   Stairs             Wheelchair Mobility    Modified Rankin (Stroke Patients Only)       Balance Overall balance assessment: Mild deficits observed, not formally tested                                          Cognition Arousal/Alertness: Awake/alert Behavior During Therapy: WFL for tasks assessed/performed Overall Cognitive Status: Impaired/Different from baseline Area of Impairment: Attention;Orientation;Awareness;Memory                 Orientation Level: Person Current Attention Level: Selective           General Comments: tends to neglect left side      Exercises General Exercises - Lower Extremity Long Arc Quad: AROM;Strengthening;Both;10 reps;Seated Hip Flexion/Marching: Seated;AROM;Strengthening;Both;10 reps Toe Raises: Seated;AROM;Strengthening;10 reps;Right Heel Raises: Seated;AROM;Strengthening;10 reps;Right Other Exercises Other Exercises: While seated at EOB, patient was able to shuffle deck of cards when asked although upon finishing task  it was noted that not all cards were facing the same direction. Patient was asked to place all cards facing one direction; he was unable to complete task. Patient was then asked to sort the deck into two piles: black and red. With only a verbal request patient was unable to complete. Therapist then provided a visual demonstration and patient was again unable to complete task. Instead of sorting the deck into two  piles based on color, patient placed all face cards into one pile. Patient was then asked to find and locate all the hearts with a visual demonstration provided. patient was able to successfully locate all the hearts with only 1 club card placed in pile.  Other Exercises: Therapist requested that patient draw a clock. He was unable to understand. Therapist provided patient with an example of a clock and then requested that he draw it. He drew 2 short lines and then stated that he was unable to.     General Comments        Pertinent Vitals/Pain Pain Assessment: No/denies pain    Home Living                      Prior Function            PT Goals (current goals can now be found in the care plan section) Acute Rehab PT Goals Patient Stated Goal: return home PT Goal Formulation: With patient Time For Goal Achievement: 04/01/18 Potential to Achieve Goals: Good    Frequency    7X/week      PT Plan Current plan remains appropriate    Co-evaluation              AM-PAC PT "6 Clicks" Daily Activity  Outcome Measure  Difficulty turning over in bed (including adjusting bedclothes, sheets and blankets)?: None Difficulty moving from lying on back to sitting on the side of the bed? : None Difficulty sitting down on and standing up from a chair with arms (e.g., wheelchair, bedside commode, etc,.)?: None Help needed moving to and from a bed to chair (including a wheelchair)?: A Little Help needed walking in hospital room?: A Little Help needed climbing 3-5 steps with a railing? : A Lot 6 Click Score: 20    End of Session Equipment Utilized During Treatment: Gait belt Activity Tolerance: Patient tolerated treatment well Patient left: in chair;with call bell/phone within reach;with chair alarm set Nurse Communication: Mobility status PT Visit Diagnosis: Unsteadiness on feet (R26.81);Other abnormalities of gait and mobility (R26.89);Muscle weakness (generalized)  (M62.81)     Time: [id number] PT Time Calculation (min) (ACUTE ONLY): 22 min  Charges:  $Gait Training: 8-22 mins                     2:20 PM, 03/26/18 Ocie BobJames Iris Tatsch, MPT Physical Therapist [facility] 336 [phone] office [phone] mobile phone

## 2018-03-26 NOTE — Clinical Social Work Note (Signed)
LCSW spoke with Adam Koch who stated that DSS continues to work on patient's case and they are still getting in touch with patient's family.    Susann Lawhorne, Juleen ChinaHeather D, LCSW

## 2018-03-26 NOTE — Progress Notes (Signed)
PROGRESS NOTE  Adam Koch ZOX:[id number] DOB: 1956-05-26 DOA: 03/17/2018 PCP: Patient, No Pcp Per  Brief History: 62 year old male with a history of alcohol abuse, tobacco use, hypertension presented with altered mental status. Patient is unable to provide any history secondary to his encephalopathy. Apparently, the patient was agitated and confused on the afternoon of 03/17/2018. According to his girlfriend, the patient walked outside into the backyard in his underwear and began pulling out flowers. Because of his agitation, she called the police who arrived and recommended EMS to bring the patient to the hospital. According to the patient's girlfriend, the patient was "acting drunk"on 03/15/2018.She states that she last saw him normal on 03/14/2018. She states that he frequently goes out to drink alcohol after he gets paid, although she cannot elaborate how much and how often and the patient drinks. However, she states that he has drank alcohol regularly for nearly 20 years. The patient continues to smoke tobacco although the amount is unclear. The been no reports of chest pain, shortness breath, vomiting, diarrhea, abdominal pain, chest pain. In the emergency department, the patient was afebrile hemodynamically stable saturating 100% on room air. He was only oriented to name.  Assessment/Plan: Acute metabolic encephalopathy -Multifactorial including Wernicke's encephalopathy, stroke, and possibly hypertensive encephalopathyand THC -At baseline, the patient is alert and oriented x4 -Currently the patient is alert and oriented x2 -Serum B12--589 -TSH--1.094 -Urinalysis--neg for pyuria -Folic acid--11.5 -RPR--neg -Ammonia--21 -UDS--+THC -Thiamine 500 mg q 8 hours x 6, then 100mg  daily -slow improvement--no longer agitated, but remains confused  Acute right MCA infarct -Neurology Consultappreciated-->30 day event monitor -PT/OT evaluation-->SNF -Speech therapy  eval-->regular diet with thin liquids -CT brain--Moderate area of edema/acute to subacute infarct in the right parietal lobe without evidence for hemorrhage or midline shift -MRI brain--acute infarctR-MCA territorywith few other smaller foci in the same territory;questionable infarct in the inferior cerebellum on the left -MRA brain--motion degraded, no hemodynamically significant stenosis -Carotid Duplex--negative for hemodynamically significant stenosis -CTA H&N--no large vessel occlusion, stable large right MCA infarct, mild interval increase in edema with mild mass-effect around area of infarct. Petechial hemorrhage of right parietal lobe. Distal right MCA parietal branch occlusion. -Echo--EF 55-60%, no WMA, no PFO noted -LDL--68 -HbA1C--5.4 -Antiplatelet--ASA 325 mg -9/13--case discussed by Dr. Arbutus Leas with neurology, Dr. Lurene Shadow vasogenic edema to gradually improve; No need for mannitol  Essential hypertension -Allowed for permissive hypertensionin first 24 hours -hydralazine prn SBP >220 -continue amlodipine-->controlled  Severe Protein malnutrition -continue Ensure  Disposition Plan:SNFwhen bed available Family Communication:motherupdatedon phone 03/22/18  Consultants:neurology  Code Status: FULL   DVT Prophylaxis: [place] Heparin    Procedures: As Listed in Progress Note Above  Antibiotics: None   Subjective: Confused. No new complaints   Objective: Vitals:   03/25/18 1518 03/25/18 2130 03/26/18 0524 03/26/18 1319  BP: 140/79 136/78 (!) 151/87 136/82  Pulse: 70 75 (!) 57 83  Resp: 18 16 16 18   Temp: 98.7 F (37.1 C) 98.8 F (37.1 C) 98.3 F (36.8 C) 97.8 F (36.6 C)  TempSrc: Oral Oral Oral Oral  SpO2: 100% 99% 100% 100%  Weight:      Height:        Intake/Output Summary (Last 24 hours) at 03/26/2018 1348 Last data filed at 03/26/2018 0800 Gross per 24 hour  Intake 480 ml  Output -  Net 480 ml   Weight change:    Exam:  General exam: Alert, awake Respiratory system: Clear to auscultation. Respiratory  effort normal. Cardiovascular system:RRR. No murmurs, rubs, gallops. Gastrointestinal system: Abdomen is nondistended, soft and nontender. No organomegaly or masses felt. Normal bowel sounds heard.   Data Reviewed: I have personally reviewed following labs and imaging studies Basic Metabolic Panel: Recent Labs  Lab 03/20/18 0503 03/23/18 0531  NA 135 138  K 4.2 3.9  CL 107 102  CO2 23 28  GLUCOSE 93 103*  BUN 9 13  CREATININE 0.71 0.84  CALCIUM 8.4* 9.1  MG  --  2.1   Liver Function Tests: No results for input(s): AST, ALT, ALKPHOS, BILITOT, PROT, ALBUMIN in the last 168 hours. No results for input(s): LIPASE, AMYLASE in the last 168 hours. No results for input(s): AMMONIA in the last 168 hours. Coagulation Profile: No results for input(s): INR, PROTIME in the last 168 hours. CBC: Recent Labs  Lab 03/20/18 0503  WBC 7.9  HGB 15.8  HCT 47.8  MCV 96.0  PLT 226   Cardiac Enzymes: No results for input(s): CKTOTAL, CKMB, CKMBINDEX, TROPONINI in the last 168 hours. BNP: Invalid input(s): POCBNP CBG: No results for input(s): GLUCAP in the last 168 hours. HbA1C: No results for input(s): HGBA1C in the last 72 hours. Urine analysis:    Component Value Date/Time   COLORURINE YELLOW 03/18/2018 1910   APPEARANCEUR CLEAR 03/18/2018 1910   LABSPEC 1.018 03/18/2018 1910   PHURINE 5.0 03/18/2018 1910   GLUCOSEU NEGATIVE 03/18/2018 1910   HGBUR SMALL (A) 03/18/2018 1910   BILIRUBINUR NEGATIVE 03/18/2018 1910   KETONESUR 20 (A) 03/18/2018 1910   PROTEINUR NEGATIVE 03/18/2018 1910   NITRITE NEGATIVE 03/18/2018 1910   LEUKOCYTESUR NEGATIVE 03/18/2018 1910   Sepsis Labs: @LABRCNTIP (procalcitonin:4,lacticidven:4) )No results found for this or any previous visit (from the past 240 hour(s)).   Scheduled Meds: . amLODipine  5 mg Oral Daily  . aspirin  325 mg Oral Daily  . feeding  supplement (ENSURE ENLIVE)  237 mL Oral TID BM  . folic acid  1 mg Oral Daily  . heparin  5,000 Units Subcutaneous Q8H  . multivitamin with minerals  1 tablet Oral Daily  . thiamine  100 mg Oral Daily   Continuous Infusions:  Procedures/Studies: Ct Angio Head W Or Wo Contrast  Result Date: 03/19/2018 CLINICAL DATA:  62 y/o M; right MCA distribution stroke with altered mental status for follow-up. EXAM: CT ANGIOGRAPHY HEAD TECHNIQUE: Multidetector CT imaging of the head was performed using the standard protocol during bolus administration of intravenous contrast. Multiplanar CT image reconstructions and MIPs were obtained to evaluate the vascular anatomy. CONTRAST:  75mL ISOVUE-370 IOPAMIDOL (ISOVUE-370) INJECTION 76% COMPARISON:  03/17/2018 CT head. 03/18/2018 MRI and MRA head. 03/18/2018 carotid ultrasound. FINDINGS: CT HEAD Brain: Stable distribution of large right posterior MCA subacute infarctions mild interval increase in edema and local mass effect from the prior study. Additionally, there are punctate density in the right paramedian high parietal region (series 4, image 27) probably representing petechial hemorrhage. Stable chronic left parietal and bilateral anterior basal ganglia infarctions. Stable chronic microvascular ischemic changes and volume loss of the brain. No extra-axial collection, hydrocephalus, or herniation. Vascular: As below. Skull: Normal. Negative for fracture or focal lesion. Sinuses: Imaged portions are clear. Orbits: No acute finding. CTA HEAD Anterior circulation: Calcific atherosclerosis of carotid siphons with mild bilateral distal cavernous and paraclinoid stenosis. No proximal large vessel occlusion, aneurysm, or high-grade stenosis. Short segment of occlusion of a distal right MCA parietal branch (series 12, image 117 and series 15, image 12). Posterior circulation: Right  P2 long segment of moderate occlusion. No additional stenosis in the posterior circulation. No  large vessel occlusion, or aneurysm. No vascular malformation. Venous sinuses: As permitted by contrast timing, patent. Anatomic variants: None significant. Delayed phase: Enhancing laminar necrosis of the left parietal region of chronic infarction. No additional focus of abnormal enhancement of the brain. IMPRESSION: 1. Stable distribution of large right posterior MCA distribution infarction predominantly involving the right parietal lobe. Mild interval increase in edema and local mass effect. Petechial hemorrhage in right superior paramedian parietal lobe. 2. Distal right MCA parietal branch occlusion. 3. No large vessel occlusion, aneurysm, or proximal high-grade stenosis of the anterior or posterior intracranial circulation. 4. Carotid siphon calcific atherosclerosis with mild distal cavernous and paraclinoid stenosis. 5. Right P2 long segment of moderate stenosis. Electronically Signed   By: Mitzi Hansen M.D.   On: 03/19/2018 22:30   Ct Head Wo Contrast  Result Date: 03/17/2018 CLINICAL DATA:  Altered LOC EXAM: CT HEAD WITHOUT CONTRAST TECHNIQUE: Contiguous axial images were obtained from the base of the skull through the vertex without intravenous contrast. COMPARISON:  None. FINDINGS: Brain: No hemorrhage or intracranial mass. Low-density edema within the right parietal lobe consistent with infarct. Low attenuation within the left parietal lobe with associated mild volume loss, suggesting prior infarct. Chronic appearing infarcts within the bilateral basal ganglia. Moderate small vessel ischemic change of the white matter. Nonenlarged ventricles. Vascular: No hyperdense vessels.  Carotid vascular calcification Skull: No fracture Sinuses/Orbits: Old appearing blowout fractures of the medial walls of both orbits. Old appearing nasal bone fracture. No acute abnormality Other: None IMPRESSION: 1. Moderate area of edema/acute to subacute infarct in the right parietal lobe without evidence for  hemorrhage or midline shift. 2. Suspected chronic infarct within the left parietal lobe. Chronic infarcts within the bilateral basal ganglia with small vessel ischemic changes of the white matter. Electronically Signed   By: Jasmine Pang M.D.   On: 03/17/2018 19:26   Mr Brain Wo Contrast  Result Date: 03/18/2018 CLINICAL DATA:  Mental status changes.  Confusion. EXAM: MRI HEAD WITHOUT CONTRAST MRA HEAD WITHOUT CONTRAST TECHNIQUE: Multiplanar, multiecho pulse sequences of the brain and surrounding structures were obtained without intravenous contrast. Angiographic images of the head were obtained using MRA technique without contrast. COMPARISON:  CT 03/17/2018 FINDINGS: MRI HEAD FINDINGS Brain: The study is markedly degraded by motion. There is a 6-7 cm region of acute infarction in the right middle cerebral artery territory affecting the posterior temporal lobe and parietal lobe. Few smaller patchy areas of acute infarction anterior to that at the frontoparietal junction region. Mild swelling but no sign of hemorrhage. Old basal ganglia infarctions bilaterally. No sign of hydrocephalus I think there may also be acute infarction in the inferior cerebellum on the left, though this is less certain because of motion artifact. Vascular: No vascular information on this exam. Skull and upper cervical spine: No abnormality seen. Sinuses/Orbits: Negative Other: None MRA HEAD FINDINGS Marked motion degradation. Both internal carotid arteries are patent through the skull base. There is at least some flow in the middle cerebral artery branches and in the anterior cerebral arteries. Beyond that, no information is available regarding the anterior circulation. Both vertebral arteries are patent to the basilar. The basilar artery is patent. Posterior circulation branch vessels are not visible due to motion. IMPRESSION: Severely motion degraded exam. 6-7 cm region of acute infarction affecting the right middle cerebral artery  territory, with a few other smaller foci of infarction in that  territory. Mild swelling but no evidence of hemorrhage on these limited images. Question small region of acute infarction in the inferior cerebellum on the left as well. If this is present, this would certainly elevate the likelihood of embolic disease from the heart or ascending aorta. If not a real finding, most likely source the embolus would be the right carotid bifurcation. Electronically Signed   By: Paulina Fusi M.D.   On: 03/18/2018 07:57   US Carotid Bilateral (at [facility] Only)  Result Date: 03/18/2018 CLINICAL DATA:  Acute stroke, confusion.  Previous tobacco abuse. EXAM: BILATERAL CAROTID DUPLEX ULTRASOUND TECHNIQUE: Wallace Cullens scale imaging, color Doppler and duplex ultrasound were performed of bilateral carotid and vertebral arteries in the neck. COMPARISON:  None. FINDINGS: Criteria: Quantification of carotid stenosis is based on velocity parameters that correlate the residual internal carotid diameter with NASCET-based stenosis levels, using the diameter of the distal internal carotid lumen as the denominator for stenosis measurement. The following velocity measurements were obtained: RIGHT ICA: 51/14 cm/sec CCA: 54/8 cm/sec SYSTOLIC ICA/CCA RATIO:  0.9 ECA: 43 cm/sec LEFT ICA: 63/14 cm/sec CCA: 44/6 cm/sec SYSTOLIC ICA/CCA RATIO:  1.4 ECA: 42 cm/sec RIGHT CAROTID ARTERY: Calcified plaque in the bulb extending to the ICA origin. No high-grade stenosis. Normal waveforms and color Doppler signal. RIGHT VERTEBRAL ARTERY:  Normal flow direction and waveform. LEFT CAROTID ARTERY: Eccentric calcified plaque in the bulb. No high-grade stenosis. Normal waveforms and color Doppler signal. LEFT VERTEBRAL ARTERY:  Normal flow direction and waveform. IMPRESSION: 1. Bilateral carotid bifurcation plaque resulting in less than 50% diameter ICA stenosis. 2.  Antegrade bilateral vertebral arterial flow. Electronically Signed   By: Corlis Leak M.D.   On:  03/18/2018 11:14   Dg Chest Port 1 View  Result Date: 03/17/2018 CLINICAL DATA:  Found wandering the streets confused, question stroke, hypertension, smoker EXAM: PORTABLE CHEST 1 VIEW COMPARISON:  Portable exam 2102 hours without priors for comparison FINDINGS: Normal heart size, mediastinal contours, and pulmonary vascularity. Lungs clear. No pleural effusion or pneumothorax. No acute osseous findings. IMPRESSION: No acute abnormalities. Electronically Signed   By: Ulyses Southward M.D.   On: 03/17/2018 21:25   Mr Maxine Glenn Head/brain ZO Cm  Result Date: 03/18/2018 CLINICAL DATA:  Mental status changes.  Confusion. EXAM: MRI HEAD WITHOUT CONTRAST MRA HEAD WITHOUT CONTRAST TECHNIQUE: Multiplanar, multiecho pulse sequences of the brain and surrounding structures were obtained without intravenous contrast. Angiographic images of the head were obtained using MRA technique without contrast. COMPARISON:  CT 03/17/2018 FINDINGS: MRI HEAD FINDINGS Brain: The study is markedly degraded by motion. There is a 6-7 cm region of acute infarction in the right middle cerebral artery territory affecting the posterior temporal lobe and parietal lobe. Few smaller patchy areas of acute infarction anterior to that at the frontoparietal junction region. Mild swelling but no sign of hemorrhage. Old basal ganglia infarctions bilaterally. No sign of hydrocephalus I think there may also be acute infarction in the inferior cerebellum on the left, though this is less certain because of motion artifact. Vascular: No vascular information on this exam. Skull and upper cervical spine: No abnormality seen. Sinuses/Orbits: Negative Other: None MRA HEAD FINDINGS Marked motion degradation. Both internal carotid arteries are patent through the skull base. There is at least some flow in the middle cerebral artery branches and in the anterior cerebral arteries. Beyond that, no information is available regarding the anterior circulation. Both vertebral  arteries are patent to the basilar. The basilar artery is patent. Posterior  circulation branch vessels are not visible due to motion. IMPRESSION: Severely motion degraded exam. 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 Mild swelling but no evidence of hemorrhage on these limited images. Question small region of acute infarction in the inferior cerebellum on the left as well. If this is present, this would certainly elevate the likelihood of embolic disease from the heart or ascending aorta. If not a real finding, most likely source the embolus would be the right carotid bifurcation. Electronically Signed   By: Paulina FusiMark  Shogry M.D.   On: 03/18/2018 07:57    Adam BlinksJehanzeb Leeba Barbe, MD  [facility] Pager [phone]  If 7PM-7AM, please contact night-coverage [email or web address] Password [email or web address] 03/26/2018, 1:48 PM   LOS: 9 days

## 2018-03-26 NOTE — Care Management Note (Signed)
Case Management Note  Patient Details  Name: Adam Koch MRN: [id number] Date of Birth: 01/31/1956  If discussed at Long Length of Stay Meetings, dates discussed:  03/26/18  Additional Comments:  Adam Koch, Adam Balz Demske, RN 03/26/2018, 1:48 PM
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
## 2018-03-27 NOTE — Progress Notes (Signed)
Physical Therapy Treatment Patient Details Name: Adam Koch MRN: [id number] DOB: 03/19/1956 Today's Date: 03/27/2018    History of Present Illness  Adam Koch is a 62 y.o. male with medical history significant of alcohol abuse, tobacco use, hypertension who is coming to the emergency department due to being found by EMS and RDP wandering on the streets confused.  He is unable to provide further history.  A friend of the patient talked to EMS and stated that he drinks regularly.  He smokes daily an unknown amount of cigarettes.  He is only oriented to name and does not follow most simple commands.    PT Comments    Patient has limited left ankle dorsiflexion with tight heel cords and limited for performing left ankle dorsiflexion/plantarflexion exercises, has to hike left hip to clear left foot resulting in limited heel strike and slow slightly labored cadence, continues to neglect left side requiring repeated verbal and occasional tactile cueing to make left turns.  Patient tolerated staying up in chair after therapy - RN notified.  Patient will benefit from continued physical therapy in hospital and recommended venue below to increase strength, balance, endurance for safe ADLs and gait.   Follow Up Recommendations  SNF;Supervision/Assistance - 24 hour;Supervision for mobility/OOB     Equipment Recommendations       Recommendations for Other Services       Precautions / Restrictions Precautions Precautions: Fall Precaution Comments: left side physical and visual neglect Restrictions Weight Bearing Restrictions: No    Mobility  Bed Mobility               General bed mobility comments: Patient presents seated in chair (assisted by nursing staff)  Transfers Overall transfer level: Modified independent Equipment used: None                Ambulation/Gait Ambulation/Gait assistance: Min guard Gait Distance (Feet): 200 Feet Assistive device: None Gait  Pattern/deviations: Decreased step length - right;Decreased step length - left;Decreased stride length;Decreased dorsiflexion - left Gait velocity: decreased   General Gait Details: demonstrates limited left ankle dorsilfexion resulting in shorter step/stride length, lack of left heel strike and slow slightly labored cadence, no loss of balance and requires repeated verbal cuing when making left turns   Optometristtairs             Wheelchair Mobility    Modified Rankin (Stroke Patients Only)       Balance Overall balance assessment: Mild deficits observed, not formally tested                                          Cognition Arousal/Alertness: Awake/alert Behavior During Therapy: WFL for tasks assessed/performed Overall Cognitive Status: Impaired/Different from baseline Area of Impairment: Attention;Orientation;Memory;Awareness                 Orientation Level: Person Current Attention Level: Selective Memory: Decreased short-term memory   Safety/Judgement: Decreased awareness of safety;Decreased awareness of deficits     General Comments: tends to neglect left side      Exercises General Exercises - Lower Extremity Long Arc Quad: AROM;Strengthening;Both;10 reps;Seated Hip Flexion/Marching: Seated;AROM;Strengthening;Both;10 reps Toe Raises: Seated;AROM;Strengthening;10 reps;Right Heel Raises: Seated;AROM;Strengthening;10 reps;Right    General Comments        Pertinent Vitals/Pain Pain Assessment: No/denies pain    Home Living  Prior Function            PT Goals (current goals can now be found in the care plan section) Acute Rehab PT Goals Patient Stated Goal: return home PT Goal Formulation: With patient Time For Goal Achievement: 04/01/18 Potential to Achieve Goals: Good Progress towards PT goals: Progressing toward goals    Frequency    7X/week      PT Plan Current plan remains appropriate     Co-evaluation              AM-PAC PT "6 Clicks" Daily Activity  Outcome Measure  Difficulty turning over in bed (including adjusting bedclothes, sheets and blankets)?: None Difficulty moving from lying on back to sitting on the side of the bed? : None Difficulty sitting down on and standing up from a chair with arms (e.g., wheelchair, bedside commode, etc,.)?: None Help needed moving to and from a bed to chair (including a wheelchair)?: A Little Help needed walking in hospital room?: A Little Help needed climbing 3-5 steps with a railing? : A Lot 6 Click Score: 20    End of Session Equipment Utilized During Treatment: Gait belt Activity Tolerance: Patient tolerated treatment well Patient left: in chair;with call bell/phone within reach;with chair alarm set Nurse Communication: Mobility status;Other (comment)(RN notified that patient left up in chair) PT Visit Diagnosis: Unsteadiness on feet (R26.81);Other abnormalities of gait and mobility (R26.89);Muscle weakness (generalized) (M62.81)     Time: 9147-8295 PT Time Calculation (min) (ACUTE ONLY): 27 min  Charges:  $Gait Training: 8-22 mins $Therapeutic Exercise: 8-22 mins                     12:28 PM, 03/27/18 Ocie Bob, MPT Physical Therapist with [facility] 336 [phone] office [phone] mobile phone

## 2018-03-27 NOTE — Progress Notes (Signed)
PROGRESS NOTE  Adam Koch AVW:[id number] DOB: Jan 15, 1956 DOA: 03/17/2018 PCP: Patient, No Pcp Per  Brief History: 62 year old male with a history of alcohol abuse, tobacco use, hypertension presented with altered mental status. Patient is unable to provide any history secondary to his encephalopathy. Apparently, the patient was agitated and confused on the afternoon of 03/17/2018. According to his girlfriend, the patient walked outside into the backyard in his underwear and began pulling out flowers. Because of his agitation, she called the police who arrived and recommended EMS to bring the patient to the hospital. According to the patient's girlfriend, the patient was "acting drunk"on 03/15/2018.She states that she last saw him normal on 03/14/2018. She states that he frequently goes out to drink alcohol after he gets paid, although she cannot elaborate how much and how often and the patient drinks. However, she states that he has drank alcohol regularly for nearly 20 years. The patient continues to smoke tobacco although the amount is unclear. The been no reports of chest pain, shortness breath, vomiting, diarrhea, abdominal pain, chest pain. In the emergency department, the patient was afebrile hemodynamically stable saturating 100% on room air. He was only oriented to name.  Assessment/Plan: Acute metabolic encephalopathy -Multifactorial including Wernicke's encephalopathy, stroke, and possibly hypertensive encephalopathyand THC -At baseline, the patient is alert and oriented x4 -Currently the patient is alert and oriented x2 -Serum B12--589 -TSH--1.094 -Urinalysis--neg for pyuria -Folic acid--11.5 -RPR--neg -Ammonia--21 -UDS--+THC -Thiamine 500 mg q 8 hours x 6, then 100mg  daily -slow improvement--no longer agitated, but remains confused  Acute right MCA infarct -Neurology Consultappreciated-->30 day event monitor -PT/OT evaluation-->SNF -Speech therapy  eval-->regular diet with thin liquids -CT brain--Moderate area of edema/acute to subacute infarct in the right parietal lobe without evidence for hemorrhage or midline shift -MRI brain--acute infarctR-MCA territorywith few other smaller foci in the same territory;questionable infarct in the inferior cerebellum on the left -MRA brain--motion degraded, no hemodynamically significant stenosis -Carotid Duplex--negative for hemodynamically significant stenosis -CTA H&N--no large vessel occlusion, stable large right MCA infarct, mild interval increase in edema with mild mass-effect around area of infarct. Petechial hemorrhage of right parietal lobe. Distal right MCA parietal branch occlusion. -Echo--EF 55-60%, no WMA, no PFO noted -LDL--68 -HbA1C--5.4 -Antiplatelet--ASA 325 mg -9/13--case discussed by Dr. Arbutus Leas with neurology, Dr. Lurene Shadow vasogenic edema to gradually improve; No need for mannitol  Essential hypertension -Allowed for permissive hypertensionin first 24 hours -hydralazine prn SBP >220 -continue amlodipine-->controlled  Severe Protein malnutrition -continue Ensure  Disposition Plan:SNFwhen bed available, DSS is working on patient's case and trying to get in touch with family.  Family Communication:motherupdatedon phone 03/22/18  Consultants:neurology  Code Status: FULL   DVT Prophylaxis:  Heparin    Procedures: As Listed in Progress Note Above  Antibiotics: None   Subjective: Remains confused, pleasant, no complaints   Objective: Vitals:   03/27/18 0502 03/27/18 0747 03/27/18 1222 03/27/18 1430  BP: (!) 173/101 138/70 (!) 150/85 128/80  Pulse: 63 64 71 95  Resp: 20 17  18   Temp: 97.8 F (36.6 C)   98.9 F (37.2 C)  TempSrc: Oral     SpO2: 94% 99%  100%  Weight:      Height:        Intake/Output Summary (Last 24 hours) at 03/27/2018 1719 Last data filed at 03/27/2018 0800 Gross per 24 hour  Intake 840 ml    Output -  Net 840 ml   Weight change:  Exam:  General exam: Alert, awake Respiratory system: Clear to auscultation. Respiratory effort normal. Cardiovascular system:RRR. No murmurs, rubs, gallops. Gastrointestinal system: Abdomen is nondistended, soft and nontender. No organomegaly or masses felt. Normal bowel sounds heard.   Data Reviewed: I have personally reviewed following labs and imaging studies Basic Metabolic Panel: Recent Labs  Lab 03/23/18 0531  NA 138  K 3.9  CL 102  CO2 28  GLUCOSE 103*  BUN 13  CREATININE 0.84  CALCIUM 9.1  MG 2.1   Liver Function Tests: No results for input(s): AST, ALT, ALKPHOS, BILITOT, PROT, ALBUMIN in the last 168 hours. No results for input(s): LIPASE, AMYLASE in the last 168 hours. No results for input(s): AMMONIA in the last 168 hours. Coagulation Profile: No results for input(s): INR, PROTIME in the last 168 hours. CBC: No results for input(s): WBC, NEUTROABS, HGB, HCT, MCV, PLT in the last 168 hours. Cardiac Enzymes: No results for input(s): CKTOTAL, CKMB, CKMBINDEX, TROPONINI in the last 168 hours. BNP: Invalid input(s): POCBNP CBG: No results for input(s): GLUCAP in the last 168 hours. HbA1C: No results for input(s): HGBA1C in the last 72 hours. Urine analysis:    Component Value Date/Time   COLORURINE YELLOW 03/18/2018 1910   APPEARANCEUR CLEAR 03/18/2018 1910   LABSPEC 1.018 03/18/2018 1910   PHURINE 5.0 03/18/2018 1910   GLUCOSEU NEGATIVE 03/18/2018 1910   HGBUR SMALL (A) 03/18/2018 1910   BILIRUBINUR NEGATIVE 03/18/2018 1910   KETONESUR 20 (A) 03/18/2018 1910   PROTEINUR NEGATIVE 03/18/2018 1910   NITRITE NEGATIVE 03/18/2018 1910   LEUKOCYTESUR NEGATIVE 03/18/2018 1910   Sepsis Labs: @LABRCNTIP (procalcitonin:4,lacticidven:4) )No results found for this or any previous visit (from the past 240 hour(s)).   Scheduled Meds: . amLODipine  5 mg Oral Daily  . aspirin  325 mg Oral Daily  . feeding supplement  (ENSURE ENLIVE)  237 mL Oral TID BM  . folic acid  1 mg Oral Daily  . heparin  5,000 Units Subcutaneous Q8H  . multivitamin with minerals  1 tablet Oral Daily  . thiamine  100 mg Oral Daily   Continuous Infusions:  Procedures/Studies: Ct Angio Head W Or Wo Contrast  Result Date: 03/19/2018 CLINICAL DATA:  62 y/o M; right MCA distribution stroke with altered mental status for follow-up. EXAM: CT ANGIOGRAPHY HEAD TECHNIQUE: Multidetector CT imaging of the head was performed using the standard protocol during bolus administration of intravenous contrast. Multiplanar CT image reconstructions and MIPs were obtained to evaluate the vascular anatomy. CONTRAST:  75mL ISOVUE-370 IOPAMIDOL (ISOVUE-370) INJECTION 76% COMPARISON:  03/17/2018 CT head. 03/18/2018 MRI and MRA head. 03/18/2018 carotid ultrasound. FINDINGS: CT HEAD Brain: Stable distribution of large right posterior MCA subacute infarctions mild interval increase in edema and local mass effect from the prior study. Additionally, there are punctate density in the right paramedian high parietal region (series 4, image 27) probably representing petechial hemorrhage. Stable chronic left parietal and bilateral anterior basal ganglia infarctions. Stable chronic microvascular ischemic changes and volume loss of the brain. No extra-axial collection, hydrocephalus, or herniation. Vascular: As below. Skull: Normal. Negative for fracture or focal lesion. Sinuses: Imaged portions are clear. Orbits: No acute finding. CTA HEAD Anterior circulation: Calcific atherosclerosis of carotid siphons with mild bilateral distal cavernous and paraclinoid stenosis. No proximal large vessel occlusion, aneurysm, or high-grade stenosis. Short segment of occlusion of a distal right MCA parietal branch (series 12, image 117 and series 15, image 12). Posterior circulation: Right P2 long segment of moderate occlusion. No additional stenosis in the  posterior circulation. No large vessel  occlusion, or aneurysm. No vascular malformation. Venous sinuses: As permitted by contrast timing, patent. Anatomic variants: None significant. Delayed phase: Enhancing laminar necrosis of the left parietal region of chronic infarction. No additional focus of abnormal enhancement of the brain. IMPRESSION: 1. Stable distribution of large right posterior MCA distribution infarction predominantly involving the right parietal lobe. Mild interval increase in edema and local mass effect. Petechial hemorrhage in right superior paramedian parietal lobe. 2. Distal right MCA parietal branch occlusion. 3. No large vessel occlusion, aneurysm, or proximal high-grade stenosis of the anterior or posterior intracranial circulation. 4. Carotid siphon calcific atherosclerosis with mild distal cavernous and paraclinoid stenosis. 5. Right P2 long segment of moderate stenosis. Electronically Signed   By: Mitzi Hansen M.D.   On: 03/19/2018 22:30   Ct Head Wo Contrast  Result Date: 03/17/2018 CLINICAL DATA:  Altered LOC EXAM: CT HEAD WITHOUT CONTRAST TECHNIQUE: Contiguous axial images were obtained from the base of the skull through the vertex without intravenous contrast. COMPARISON:  None. FINDINGS: Brain: No hemorrhage or intracranial mass. Low-density edema within the right parietal lobe consistent with infarct. Low attenuation within the left parietal lobe with associated mild volume loss, suggesting prior infarct. Chronic appearing infarcts within the bilateral basal ganglia. Moderate small vessel ischemic change of the white matter. Nonenlarged ventricles. Vascular: No hyperdense vessels.  Carotid vascular calcification Skull: No fracture Sinuses/Orbits: Old appearing blowout fractures of the medial walls of both orbits. Old appearing nasal bone fracture. No acute abnormality Other: None IMPRESSION: 1. Moderate area of edema/acute to subacute infarct in the right parietal lobe without evidence for hemorrhage or  midline shift. 2. Suspected chronic infarct within the left parietal lobe. Chronic infarcts within the bilateral basal ganglia with small vessel ischemic changes of the white matter. Electronically Signed   By: Jasmine Pang M.D.   On: 03/17/2018 19:26   Mr Brain Wo Contrast  Result Date: 03/18/2018 CLINICAL DATA:  Mental status changes.  Confusion. EXAM: MRI HEAD WITHOUT CONTRAST MRA HEAD WITHOUT CONTRAST TECHNIQUE: Multiplanar, multiecho pulse sequences of the brain and surrounding structures were obtained without intravenous contrast. Angiographic images of the head were obtained using MRA technique without contrast. COMPARISON:  CT 03/17/2018 FINDINGS: MRI HEAD FINDINGS Brain: The study is markedly degraded by motion. There is a 6-7 cm region of acute infarction in the right middle cerebral artery territory affecting the posterior temporal lobe and parietal lobe. Few smaller patchy areas of acute infarction anterior to that at the frontoparietal junction region. Mild swelling but no sign of hemorrhage. Old basal ganglia infarctions bilaterally. No sign of hydrocephalus I think there may also be acute infarction in the inferior cerebellum on the left, though this is less certain because of motion artifact. Vascular: No vascular information on this exam. Skull and upper cervical spine: No abnormality seen. Sinuses/Orbits: Negative Other: None MRA HEAD FINDINGS Marked motion degradation. Both internal carotid arteries are patent through the skull base. There is at least some flow in the middle cerebral artery branches and in the anterior cerebral arteries. Beyond that, no information is available regarding the anterior circulation. Both vertebral arteries are patent to the basilar. The basilar artery is patent. Posterior circulation branch vessels are not visible due to motion. IMPRESSION: Severely motion degraded exam. 6-7 cm region of acute infarction affecting the right middle cerebral artery territory, with  a few other smaller foci of infarction in that territory. Mild swelling but no evidence of hemorrhage on these limited  images. Question small region of acute infarction in the inferior cerebellum on the left as well. If this is present, this would certainly elevate the likelihood of embolic disease from the heart or ascending aorta. If not a real finding, most likely source the embolus would be the right carotid bifurcation. Electronically Signed   By: Paulina Fusi M.D.   On: 03/18/2018 07:57   US Carotid Bilateral (at [facility] And [facility] Only)  Result Date: 03/18/2018 CLINICAL DATA:  Acute stroke, confusion.  Previous tobacco abuse. EXAM: BILATERAL CAROTID DUPLEX ULTRASOUND TECHNIQUE: Wallace Cullens scale imaging, color Doppler and duplex ultrasound were performed of bilateral carotid and vertebral arteries in the neck. COMPARISON:  None. FINDINGS: Criteria: Quantification of carotid stenosis is based on velocity parameters that correlate the residual internal carotid diameter with NASCET-based stenosis levels, using the diameter of the distal internal carotid lumen as the denominator for stenosis measurement. The following velocity measurements were obtained: RIGHT ICA: 51/14 cm/sec CCA: 54/8 cm/sec SYSTOLIC ICA/CCA RATIO:  0.9 ECA: 43 cm/sec LEFT ICA: 63/14 cm/sec CCA: 44/6 cm/sec SYSTOLIC ICA/CCA RATIO:  1.4 ECA: 42 cm/sec RIGHT CAROTID ARTERY: Calcified plaque in the bulb extending to the ICA origin. No high-grade stenosis. Normal waveforms and color Doppler signal. RIGHT VERTEBRAL ARTERY:  Normal flow direction and waveform. LEFT CAROTID ARTERY: Eccentric calcified plaque in the bulb. No high-grade stenosis. Normal waveforms and color Doppler signal. LEFT VERTEBRAL ARTERY:  Normal flow direction and waveform. IMPRESSION: 1. Bilateral carotid bifurcation plaque resulting in less than 50% diameter ICA stenosis. 2.  Antegrade bilateral vertebral arterial flow. Electronically Signed   By: Corlis Leak M.D.   On: 03/18/2018 11:14     Dg Chest Port 1 View  Result Date: 03/17/2018 CLINICAL DATA:  Found wandering the streets confused, question stroke, hypertension, smoker EXAM: PORTABLE CHEST 1 VIEW COMPARISON:  Portable exam 2102 hours without priors for comparison FINDINGS: Normal heart size, mediastinal contours, and pulmonary vascularity. Lungs clear. No pleural effusion or pneumothorax. No acute osseous findings. IMPRESSION: No acute abnormalities. Electronically Signed   By: Ulyses Southward M.D.   On: 03/17/2018 21:25   Mr Maxine Glenn Head/brain ZO Cm  Result Date: 03/18/2018 CLINICAL DATA:  Mental status changes.  Confusion. EXAM: MRI HEAD WITHOUT CONTRAST MRA HEAD WITHOUT CONTRAST TECHNIQUE: Multiplanar, multiecho pulse sequences of the brain and surrounding structures were obtained without intravenous contrast. Angiographic images of the head were obtained using MRA technique without contrast. COMPARISON:  CT 03/17/2018 FINDINGS: MRI HEAD FINDINGS Brain: The study is markedly degraded by motion. There is a 6-7 cm region of acute infarction in the right middle cerebral artery territory affecting the posterior temporal lobe and parietal lobe. Few smaller patchy areas of acute infarction anterior to that at the frontoparietal junction region. Mild swelling but no sign of hemorrhage. Old basal ganglia infarctions bilaterally. No sign of hydrocephalus I think there may also be acute infarction in the inferior cerebellum on the left, though this is less certain because of motion artifact. Vascular: No vascular information on this exam. Skull and upper cervical spine: No abnormality seen. Sinuses/Orbits: Negative Other: None MRA HEAD FINDINGS Marked motion degradation. Both internal carotid arteries are patent through the skull base. There is at least some flow in the middle cerebral artery branches and in the anterior cerebral arteries. Beyond that, no information is available regarding the anterior circulation. Both vertebral arteries are patent  to the basilar. The basilar artery is patent. Posterior circulation branch vessels are not visible due to motion. IMPRESSION:  Severely motion degraded exam. 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 Mild swelling but no evidence of hemorrhage on these limited images. Question small region of acute infarction in the inferior cerebellum on the left as well. If this is present, this would certainly elevate the likelihood of embolic disease from the heart or ascending aorta. If not a real finding, most likely source the embolus would be the right carotid bifurcation. Electronically Signed   By: Paulina FusiMark  Shogry M.D.   On: 03/18/2018 07:57    Erick BlinksJehanzeb Memon, MD  [facility] Pager [phone]  If 7PM-7AM, please contact night-coverage [email or web address] Password [email or web address] 03/27/2018, 5:19 PM   LOS: 10 days

## 2018-03-28 NOTE — Progress Notes (Signed)
Physical Therapy Treatment Patient Details Name: Ethelene BrownsLonnie Cullipher MRN: [id number] DOB: 01/21/1956 Today's Date: 03/28/2018    History of Present Illness  Lucile Loewe is a 62 y.o. male with medical history significant of alcohol abuse, tobacco use, hypertension who is coming to the emergency department due to being found by EMS and RDP wandering on the streets confused.  He is unable to provide further history.  A friend of the patient talked to EMS and stated that he drinks regularly.  He smokes daily an unknown amount of cigarettes.  He is only oriented to name and does not follow most simple commands.    PT Comments    PT has difficulty following verbal commands but will respond to tactile or visual cues.    Follow Up Recommendations   Pt decreased to 3 x a week as pt limitations have decreased significantly     Equipment Recommendations    none   Recommendations for Other Services  SP/OT      Precautions / Restrictions   falls    Mobility  Bed Mobility    mod I               Transfers    Mod I                 Ambulation/Gait    300 ft with mod I  No assistive device                   Cognition  alert but not oriented and unable to follow verbal commands.                                             Exercises General Exercises - Lower Extremity Toe Raises: (marching in place x 10 ) Heel Raises: Both;10 reps;Standing Mini-Sqauts: 5 reps;Standing        Pertinent Vitals/Pain  0/10    Home Living    Pt states he was living alone but unsure if this is correct as there Is no family present.                   Prior Function     I       PT Goals (current goals can now be found in the care plan section)   Progressing    Frequency      3x a week      PT Plan  Continue to work on higher balance activities     Co-evaluation                 End of Session  PT in chair with chair alarm on              Time: 1040-1107 PT Time Calculation (min) (ACUTE ONLY): 27 min  Charges:  $Gait Training: 8-22 mins $Therapeutic Exercise: 8-22 mins                      Virgina OrganCynthia Russell, PT CLT [phone] 03/28/2018, 11:07 AM

## 2018-03-28 NOTE — Progress Notes (Signed)
PROGRESS NOTE  Adam Koch EXB:[id number] DOB: [birth year] DOA: 03/17/2018 PCP: Patient, No Pcp Per  Brief History: 62 year old male with a history of alcohol abuse, tobacco use, hypertension presented with altered mental status. Patient is unable to provide any history secondary to his encephalopathy. Apparently, the patient was agitated and confused on the afternoon of 03/17/2018. According to his girlfriend, the patient walked outside into the backyard in his underwear and began pulling out flowers. Because of his agitation, she called the police who arrived and recommended EMS to bring the patient to the hospital. According to the patient's girlfriend, the patient was "acting drunk"on 03/15/2018.She states that she last saw him normal on 03/14/2018. She states that he frequently goes out to drink alcohol after he gets paid, although she cannot elaborate how much and how often and the patient drinks. However, she states that he has drank alcohol regularly for nearly 20 years. The patient continues to smoke tobacco although the amount is unclear. The been no reports of chest pain, shortness breath, vomiting, diarrhea, abdominal pain, chest pain. In the emergency department, the patient was afebrile hemodynamically stable saturating 100% on room air. He was only oriented to name.  Assessment/Plan: Acute metabolic encephalopathy -Multifactorial including Wernicke's encephalopathy, stroke, and possibly hypertensive encephalopathyand THC -At baseline, the patient is alert and oriented x4 -Currently the patient is alert and oriented x2 -Serum B12--589 -TSH--1.094 -Urinalysis--neg for pyuria -Folic acid--11.5 -RPR--neg -Ammonia--21 -UDS--+THC -Thiamine 500 mg q 8 hours x 6, then 100mg  daily -slow improvement--no longer agitated, but remains mildly confused  Acute right MCA infarct -Neurology Consultappreciated-->30 day event monitor -PT/OT evaluation-->SNF -Speech  therapy eval-->regular diet with thin liquids -CT brain--Moderate area of edema/acute to subacute infarct in the right parietal lobe without evidence for hemorrhage or midline shift -MRI brain--acute infarctR-MCA territorywith few other smaller foci in the same territory;questionable infarct in the inferior cerebellum on the left -MRA brain--motion degraded, no hemodynamically significant stenosis -Carotid Duplex--negative for hemodynamically significant stenosis -CTA H&N--no large vessel occlusion, stable large right MCA infarct, mild interval increase in edema with mild mass-effect around area of infarct. Petechial hemorrhage of right parietal lobe. Distal right MCA parietal branch occlusion. -Echo--EF 55-60%, no WMA, no PFO noted -LDL--68 -HbA1C--5.4 -Antiplatelet--ASA 325 mg -9/13--case discussed by Dr. Arbutus Leas with neurology, Dr. Lurene Shadow vasogenic edema to gradually improve; No need for mannitol  Essential hypertension -currently on amlodipine. Blood pressure stable  Severe Protein malnutrition -continue Ensure  Disposition Plan:SNFwhen bed available, DSS is working on patient's case and trying to get in touch with family.  Family Communication:motherupdatedon phone 03/22/18  Consultants:neurology  Code Status: FULL   DVT Prophylaxis: [place] Heparin    Procedures: As Listed in Progress Note Above  Antibiotics: None   Subjective: No new complaints. Still has some confusion   Objective: Vitals:   03/27/18 1430 03/27/18 2130 03/28/18 0642 03/28/18 1412  BP: 128/80 (!) 175/78 (!) 148/81 125/81  Pulse: 95 63 67 70  Resp: 18 20 15 20   Temp: 98.9 F (37.2 C) 98.6 F (37 C) 98.4 F (36.9 C) (!) 97.5 F (36.4 C)  TempSrc:  Oral Oral   SpO2: 100% 100% 100% 99%  Weight:      Height:        Intake/Output Summary (Last 24 hours) at 03/28/2018 1520 Last data filed at 03/28/2018 0847 Gross per 24 hour  Intake 360 ml  Output -  Net  360 ml   Weight change:  Exam:  General exam: Alert, awake Respiratory system: Clear to auscultation. Respiratory effort normal. Cardiovascular system:RRR. No murmurs, rubs, gallops. Gastrointestinal system: Abdomen is nondistended, soft and nontender. No organomegaly or masses felt. Normal bowel sounds heard.  Data Reviewed: I have personally reviewed following labs and imaging studies Basic Metabolic Panel: Recent Labs  Lab 03/23/18 0531  NA 138  K 3.9  CL 102  CO2 28  GLUCOSE 103*  BUN 13  CREATININE 0.84  CALCIUM 9.1  MG 2.1   Liver Function Tests: No results for input(s): AST, ALT, ALKPHOS, BILITOT, PROT, ALBUMIN in the last 168 hours. No results for input(s): LIPASE, AMYLASE in the last 168 hours. No results for input(s): AMMONIA in the last 168 hours. Coagulation Profile: No results for input(s): INR, PROTIME in the last 168 hours. CBC: No results for input(s): WBC, NEUTROABS, HGB, HCT, MCV, PLT in the last 168 hours. Cardiac Enzymes: No results for input(s): CKTOTAL, CKMB, CKMBINDEX, TROPONINI in the last 168 hours. BNP: Invalid input(s): POCBNP CBG: No results for input(s): GLUCAP in the last 168 hours. HbA1C: No results for input(s): HGBA1C in the last 72 hours. Urine analysis:    Component Value Date/Time   COLORURINE YELLOW 03/18/2018 1910   APPEARANCEUR CLEAR 03/18/2018 1910   LABSPEC 1.018 03/18/2018 1910   PHURINE 5.0 03/18/2018 1910   GLUCOSEU NEGATIVE 03/18/2018 1910   HGBUR SMALL (A) 03/18/2018 1910   BILIRUBINUR NEGATIVE 03/18/2018 1910   KETONESUR 20 (A) 03/18/2018 1910   PROTEINUR NEGATIVE 03/18/2018 1910   NITRITE NEGATIVE 03/18/2018 1910   LEUKOCYTESUR NEGATIVE 03/18/2018 1910   Sepsis Labs: @LABRCNTIP (procalcitonin:4,lacticidven:4) )No results found for this or any previous visit (from the past 240 hour(s)).   Scheduled Meds: . amLODipine  5 mg Oral Daily  . aspirin  325 mg Oral Daily  . feeding supplement (ENSURE ENLIVE)  237 mL  Oral TID BM  . folic acid  1 mg Oral Daily  . heparin  5,000 Units Subcutaneous Q8H  . multivitamin with minerals  1 tablet Oral Daily  . thiamine  100 mg Oral Daily   Continuous Infusions:  Procedures/Studies: Ct Angio Head W Or Wo Contrast  Result Date: 03/19/2018 CLINICAL DATA:  62 y/o M; right MCA distribution stroke with altered mental status for follow-up. EXAM: CT ANGIOGRAPHY HEAD TECHNIQUE: Multidetector CT imaging of the head was performed using the standard protocol during bolus administration of intravenous contrast. Multiplanar CT image reconstructions and MIPs were obtained to evaluate the vascular anatomy. CONTRAST:  75mL ISOVUE-370 IOPAMIDOL (ISOVUE-370) INJECTION 76% COMPARISON:  03/17/2018 CT head. 03/18/2018 MRI and MRA head. 03/18/2018 carotid ultrasound. FINDINGS: CT HEAD Brain: Stable distribution of large right posterior MCA subacute infarctions mild interval increase in edema and local mass effect from the prior study. Additionally, there are punctate density in the right paramedian high parietal region (series 4, image 27) probably representing petechial hemorrhage. Stable chronic left parietal and bilateral anterior basal ganglia infarctions. Stable chronic microvascular ischemic changes and volume loss of the brain. No extra-axial collection, hydrocephalus, or herniation. Vascular: As below. Skull: Normal. Negative for fracture or focal lesion. Sinuses: Imaged portions are clear. Orbits: No acute finding. CTA HEAD Anterior circulation: Calcific atherosclerosis of carotid siphons with mild bilateral distal cavernous and paraclinoid stenosis. No proximal large vessel occlusion, aneurysm, or high-grade stenosis. Short segment of occlusion of a distal right MCA parietal branch (series 12, image 117 and series 15, image 12). Posterior circulation: Right P2 long segment of moderate occlusion. No additional stenosis in  the posterior circulation. No large vessel occlusion, or aneurysm.  No vascular malformation. Venous sinuses: As permitted by contrast timing, patent. Anatomic variants: None significant. Delayed phase: Enhancing laminar necrosis of the left parietal region of chronic infarction. No additional focus of abnormal enhancement of the brain. IMPRESSION: 1. Stable distribution of large right posterior MCA distribution infarction predominantly involving the right parietal lobe. Mild interval increase in edema and local mass effect. Petechial hemorrhage in right superior paramedian parietal lobe. 2. Distal right MCA parietal branch occlusion. 3. No large vessel occlusion, aneurysm, or proximal high-grade stenosis of the anterior or posterior intracranial circulation. 4. Carotid siphon calcific atherosclerosis with mild distal cavernous and paraclinoid stenosis. 5. Right P2 long segment of moderate stenosis. Electronically Signed   By: Mitzi Hansen M.D.   On: 03/19/2018 22:30   Ct Head Wo Contrast  Result Date: 03/17/2018 CLINICAL DATA:  Altered LOC EXAM: CT HEAD WITHOUT CONTRAST TECHNIQUE: Contiguous axial images were obtained from the base of the skull through the vertex without intravenous contrast. COMPARISON:  None. FINDINGS: Brain: No hemorrhage or intracranial mass. Low-density edema within the right parietal lobe consistent with infarct. Low attenuation within the left parietal lobe with associated mild volume loss, suggesting prior infarct. Chronic appearing infarcts within the bilateral basal ganglia. Moderate small vessel ischemic change of the white matter. Nonenlarged ventricles. Vascular: No hyperdense vessels.  Carotid vascular calcification Skull: No fracture Sinuses/Orbits: Old appearing blowout fractures of the medial walls of both orbits. Old appearing nasal bone fracture. No acute abnormality Other: None IMPRESSION: 1. Moderate area of edema/acute to subacute infarct in the right parietal lobe without evidence for hemorrhage or midline shift. 2. Suspected  chronic infarct within the left parietal lobe. Chronic infarcts within the bilateral basal ganglia with small vessel ischemic changes of the white matter. Electronically Signed   By: Jasmine Pang M.D.   On: 03/17/2018 19:26   Mr Brain Wo Contrast  Result Date: 03/18/2018 CLINICAL DATA:  Mental status changes.  Confusion. EXAM: MRI HEAD WITHOUT CONTRAST MRA HEAD WITHOUT CONTRAST TECHNIQUE: Multiplanar, multiecho pulse sequences of the brain and surrounding structures were obtained without intravenous contrast. Angiographic images of the head were obtained using MRA technique without contrast. COMPARISON:  CT 03/17/2018 FINDINGS: MRI HEAD FINDINGS Brain: The study is markedly degraded by motion. There is a 6-7 cm region of acute infarction in the right middle cerebral artery territory affecting the posterior temporal lobe and parietal lobe. Few smaller patchy areas of acute infarction anterior to that at the frontoparietal junction region. Mild swelling but no sign of hemorrhage. Old basal ganglia infarctions bilaterally. No sign of hydrocephalus I think there may also be acute infarction in the inferior cerebellum on the left, though this is less certain because of motion artifact. Vascular: No vascular information on this exam. Skull and upper cervical spine: No abnormality seen. Sinuses/Orbits: Negative Other: None MRA HEAD FINDINGS Marked motion degradation. Both internal carotid arteries are patent through the skull base. There is at least some flow in the middle cerebral artery branches and in the anterior cerebral arteries. Beyond that, no information is available regarding the anterior circulation. Both vertebral arteries are patent to the basilar. The basilar artery is patent. Posterior circulation branch vessels are not visible due to motion. IMPRESSION: Severely motion degraded exam. 6-7 cm region of acute infarction affecting the right middle cerebral artery territory, with a few other smaller foci of  infarction in that territory. Mild swelling but no evidence of hemorrhage on these  limited images. Question small region of acute infarction in the inferior cerebellum on the left as well. If this is present, this would certainly elevate the likelihood of embolic disease from the heart or ascending aorta. If not a real finding, most likely source the embolus would be the right carotid bifurcation. Electronically Signed   By: Paulina Fusi M.D.   On: 03/18/2018 07:57   US Carotid Bilateral (at [facility] And [facility] Only)  Result Date: 03/18/2018 CLINICAL DATA:  Acute stroke, confusion.  Previous tobacco abuse. EXAM: BILATERAL CAROTID DUPLEX ULTRASOUND TECHNIQUE: Wallace Cullens scale imaging, color Doppler and duplex ultrasound were performed of bilateral carotid and vertebral arteries in the neck. COMPARISON:  None. FINDINGS: Criteria: Quantification of carotid stenosis is based on velocity parameters that correlate the residual internal carotid diameter with NASCET-based stenosis levels, using the diameter of the distal internal carotid lumen as the denominator for stenosis measurement. The following velocity measurements were obtained: RIGHT ICA: 51/14 cm/sec CCA: 54/8 cm/sec SYSTOLIC ICA/CCA RATIO:  0.9 ECA: 43 cm/sec LEFT ICA: 63/14 cm/sec CCA: 44/6 cm/sec SYSTOLIC ICA/CCA RATIO:  1.4 ECA: 42 cm/sec RIGHT CAROTID ARTERY: Calcified plaque in the bulb extending to the ICA origin. No high-grade stenosis. Normal waveforms and color Doppler signal. RIGHT VERTEBRAL ARTERY:  Normal flow direction and waveform. LEFT CAROTID ARTERY: Eccentric calcified plaque in the bulb. No high-grade stenosis. Normal waveforms and color Doppler signal. LEFT VERTEBRAL ARTERY:  Normal flow direction and waveform. IMPRESSION: 1. Bilateral carotid bifurcation plaque resulting in less than 50% diameter ICA stenosis. 2.  Antegrade bilateral vertebral arterial flow. Electronically Signed   By: Corlis Leak M.D.   On: 03/18/2018 11:14   Dg Chest Port 1  View  Result Date: 03/17/2018 CLINICAL DATA:  Found wandering the streets confused, question stroke, hypertension, smoker EXAM: PORTABLE CHEST 1 VIEW COMPARISON:  Portable exam 2102 hours without priors for comparison FINDINGS: Normal heart size, mediastinal contours, and pulmonary vascularity. Lungs clear. No pleural effusion or pneumothorax. No acute osseous findings. IMPRESSION: No acute abnormalities. Electronically Signed   By: Ulyses Southward M.D.   On: 03/17/2018 21:25   Mr Maxine Glenn Head/brain ZO Cm  Result Date: 03/18/2018 CLINICAL DATA:  Mental status changes.  Confusion. EXAM: MRI HEAD WITHOUT CONTRAST MRA HEAD WITHOUT CONTRAST TECHNIQUE: Multiplanar, multiecho pulse sequences of the brain and surrounding structures were obtained without intravenous contrast. Angiographic images of the head were obtained using MRA technique without contrast. COMPARISON:  CT 03/17/2018 FINDINGS: MRI HEAD FINDINGS Brain: The study is markedly degraded by motion. There is a 6-7 cm region of acute infarction in the right middle cerebral artery territory affecting the posterior temporal lobe and parietal lobe. Few smaller patchy areas of acute infarction anterior to that at the frontoparietal junction region. Mild swelling but no sign of hemorrhage. Old basal ganglia infarctions bilaterally. No sign of hydrocephalus I think there may also be acute infarction in the inferior cerebellum on the left, though this is less certain because of motion artifact. Vascular: No vascular information on this exam. Skull and upper cervical spine: No abnormality seen. Sinuses/Orbits: Negative Other: None MRA HEAD FINDINGS Marked motion degradation. Both internal carotid arteries are patent through the skull base. There is at least some flow in the middle cerebral artery branches and in the anterior cerebral arteries. Beyond that, no information is available regarding the anterior circulation. Both vertebral arteries are patent to the basilar. The  basilar artery is patent. Posterior circulation branch vessels are not visible due to motion. IMPRESSION:  Severely motion degraded exam. 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 Mild swelling but no evidence of hemorrhage on these limited images. Question small region of acute infarction in the inferior cerebellum on the left as well. If this is present, this would certainly elevate the likelihood of embolic disease from the heart or ascending aorta. If not a real finding, most likely source the embolus would be the right carotid bifurcation. Electronically Signed   By: Paulina FusiMark  Shogry M.D.   On: 03/18/2018 07:57    Erick BlinksJehanzeb Marilyn Nihiser, MD  [facility] Pager [phone]  If 7PM-7AM, please contact night-coverage [email or web address] Password [email or web address] 03/28/2018, 3:20 PM   LOS: 11 days

## 2018-03-29 NOTE — Progress Notes (Signed)
PROGRESS NOTE  Jonatha Kenan EAV:[id number] DOB: 05/14/1956 DOA: 03/17/2018 PCP: Patient, No Pcp Per  Brief History: 62 year old male with a history of alcohol abuse, tobacco use, hypertension presented with altered mental status. Patient is unable to provide any history secondary to his encephalopathy. Apparently, the patient was agitated and confused on the afternoon of 03/17/2018. According to his girlfriend, the patient walked outside into the backyard in his underwear and began pulling out flowers. Because of his agitation, she called the police who arrived and recommended EMS to bring the patient to the hospital. According to the patient's girlfriend, the patient was "acting drunk"on 03/15/2018.She states that she last saw him normal on 03/14/2018. She states that he frequently goes out to drink alcohol after he gets paid, although she cannot elaborate how much and how often and the patient drinks. However, she states that he has drank alcohol regularly for nearly 20 years. The patient continues to smoke tobacco although the amount is unclear. The been no reports of chest pain, shortness breath, vomiting, diarrhea, abdominal pain, chest pain. In the emergency department, the patient was afebrile hemodynamically stable saturating 100% on room air. He was only oriented to name.  Assessment/Plan: Acute metabolic encephalopathy -Multifactorial including Wernicke's encephalopathy, stroke, and possibly hypertensive encephalopathyand THC -At baseline, the patient is alert and oriented x4 -Currently the patient is alert and oriented x2 -Serum B12--589 -TSH--1.094 -Urinalysis--neg for pyuria -Folic acid--11.5 -RPR--neg -Ammonia--21 -UDS--+THC -Thiamine 500 mg q 8 hours x 6, then 100mg  daily -slow improvement--no longer agitated, but remains mildly confused  Acute right MCA infarct -Neurology Consultappreciated-->30 day event monitor -patient did have an episode of  tachycardia overnight, but this was sinus tachycardia on the monitor. Will continue to monitor -PT/OT evaluation-->SNF -Speech therapy eval-->regular diet with thin liquids -CT brain--Moderate area of edema/acute to subacute infarct in the right parietal lobe without evidence for hemorrhage or midline shift -MRI brain--acute infarctR-MCA territorywith few other smaller foci in the same territory;questionable infarct in the inferior cerebellum on the left -MRA brain--motion degraded, no hemodynamically significant stenosis -Carotid Duplex--negative for hemodynamically significant stenosis -CTA H&N--no large vessel occlusion, stable large right MCA infarct, mild interval increase in edema with mild mass-effect around area of infarct. Petechial hemorrhage of right parietal lobe. Distal right MCA parietal branch occlusion. -Echo--EF 55-60%, no WMA, no PFO noted -LDL--68 -HbA1C--5.4 -Antiplatelet--ASA 325 mg -9/13--case discussed by Dr. Arbutus Leas with neurology, Dr. Lurene Shadow vasogenic edema to gradually improve; No need for mannitol  Essential hypertension -currently on amlodipine. Blood pressure has been stable.   Severe Protein malnutrition -continue Ensure  Disposition Plan:SNFwhen bed available, DSS is working on patient's case and trying to get in touch with family.  Family Communication:motherupdatedon phone 03/22/18  Consultants:neurology  Code Status: FULL   DVT Prophylaxis:  Heparin    Procedures: As Listed in Progress Note Above  Antibiotics: None   Subjective: Denies any chest pain, palpitations or shortness of breath   Objective: Vitals:   03/28/18 2101 03/28/18 2104 03/29/18 0523 03/29/18 1429  BP: (!) 147/87  (!) 167/88 (!) 144/88  Pulse: 67  (!) 59 82  Resp: 20  20 20   Temp:  98.5 F (36.9 C) 98.4 F (36.9 C) 98.1 F (36.7 C)  TempSrc:  Oral Oral   SpO2: 100%  100% 100%  Weight:      Height:        Intake/Output  Summary (Last 24 hours) at 03/29/2018 1450 Last data filed  at 03/29/2018 1300 Gross per 24 hour  Intake 840 ml  Output -  Net 840 ml   Weight change:  Exam:  General exam: Alert, awake, oriented x 3 Respiratory system: Clear to auscultation. Respiratory effort normal. Cardiovascular system:RRR. No murmurs, rubs, gallops. Gastrointestinal system: Abdomen is nondistended, soft and nontender. No organomegaly or masses felt. Normal bowel sounds heard.  Data Reviewed: I have personally reviewed following labs and imaging studies Basic Metabolic Panel: Recent Labs  Lab 03/23/18 0531  NA 138  K 3.9  CL 102  CO2 28  GLUCOSE 103*  BUN 13  CREATININE 0.84  CALCIUM 9.1  MG 2.1   Liver Function Tests: No results for input(s): AST, ALT, ALKPHOS, BILITOT, PROT, ALBUMIN in the last 168 hours. No results for input(s): LIPASE, AMYLASE in the last 168 hours. No results for input(s): AMMONIA in the last 168 hours. Coagulation Profile: No results for input(s): INR, PROTIME in the last 168 hours. CBC: No results for input(s): WBC, NEUTROABS, HGB, HCT, MCV, PLT in the last 168 hours. Cardiac Enzymes: No results for input(s): CKTOTAL, CKMB, CKMBINDEX, TROPONINI in the last 168 hours. BNP: Invalid input(s): POCBNP CBG: No results for input(s): GLUCAP in the last 168 hours. HbA1C: No results for input(s): HGBA1C in the last 72 hours. Urine analysis:    Component Value Date/Time   COLORURINE YELLOW 03/18/2018 1910   APPEARANCEUR CLEAR 03/18/2018 1910   LABSPEC 1.018 03/18/2018 1910   PHURINE 5.0 03/18/2018 1910   GLUCOSEU NEGATIVE 03/18/2018 1910   HGBUR SMALL (A) 03/18/2018 1910   BILIRUBINUR NEGATIVE 03/18/2018 1910   KETONESUR 20 (A) 03/18/2018 1910   PROTEINUR NEGATIVE 03/18/2018 1910   NITRITE NEGATIVE 03/18/2018 1910   LEUKOCYTESUR NEGATIVE 03/18/2018 1910   Sepsis Labs: @LABRCNTIP (procalcitonin:4,lacticidven:4) )No results found for this or any previous visit (from the past  240 hour(s)).   Scheduled Meds: . amLODipine  5 mg Oral Daily  . aspirin  325 mg Oral Daily  . feeding supplement (ENSURE ENLIVE)  237 mL Oral TID BM  . folic acid  1 mg Oral Daily  . heparin  5,000 Units Subcutaneous Q8H  . multivitamin with minerals  1 tablet Oral Daily  . thiamine  100 mg Oral Daily   Continuous Infusions:  Procedures/Studies: Ct Angio Head W Or Wo Contrast  Result Date: 03/19/2018 CLINICAL DATA:  62 y/o M; right MCA distribution stroke with altered mental status for follow-up. EXAM: CT ANGIOGRAPHY HEAD TECHNIQUE: Multidetector CT imaging of the head was performed using the standard protocol during bolus administration of intravenous contrast. Multiplanar CT image reconstructions and MIPs were obtained to evaluate the vascular anatomy. CONTRAST:  75mL ISOVUE-370 IOPAMIDOL (ISOVUE-370) INJECTION 76% COMPARISON:  03/17/2018 CT head. 03/18/2018 MRI and MRA head. 03/18/2018 carotid ultrasound. FINDINGS: CT HEAD Brain: Stable distribution of large right posterior MCA subacute infarctions mild interval increase in edema and local mass effect from the prior study. Additionally, there are punctate density in the right paramedian high parietal region (series 4, image 27) probably representing petechial hemorrhage. Stable chronic left parietal and bilateral anterior basal ganglia infarctions. Stable chronic microvascular ischemic changes and volume loss of the brain. No extra-axial collection, hydrocephalus, or herniation. Vascular: As below. Skull: Normal. Negative for fracture or focal lesion. Sinuses: Imaged portions are clear. Orbits: No acute finding. CTA HEAD Anterior circulation: Calcific atherosclerosis of carotid siphons with mild bilateral distal cavernous and paraclinoid stenosis. No proximal large vessel occlusion, aneurysm, or high-grade stenosis. Short segment of occlusion of a distal  right MCA parietal branch (series 12, image 117 and series 15, image 12). Posterior  circulation: Right P2 long segment of moderate occlusion. No additional stenosis in the posterior circulation. No large vessel occlusion, or aneurysm. No vascular malformation. Venous sinuses: As permitted by contrast timing, patent. Anatomic variants: None significant. Delayed phase: Enhancing laminar necrosis of the left parietal region of chronic infarction. No additional focus of abnormal enhancement of the brain. IMPRESSION: 1. Stable distribution of large right posterior MCA distribution infarction predominantly involving the right parietal lobe. Mild interval increase in edema and local mass effect. Petechial hemorrhage in right superior paramedian parietal lobe. 2. Distal right MCA parietal branch occlusion. 3. No large vessel occlusion, aneurysm, or proximal high-grade stenosis of the anterior or posterior intracranial circulation. 4. Carotid siphon calcific atherosclerosis with mild distal cavernous and paraclinoid stenosis. 5. Right P2 long segment of moderate stenosis. Electronically Signed   By: Mitzi HansenLance  Furusawa-Stratton M.D.   On: 03/19/2018 22:30   Ct Head Wo Contrast  Result Date: 03/17/2018 CLINICAL DATA:  Altered LOC EXAM: CT HEAD WITHOUT CONTRAST TECHNIQUE: Contiguous axial images were obtained from the base of the skull through the vertex without intravenous contrast. COMPARISON:  None. FINDINGS: Brain: No hemorrhage or intracranial mass. Low-density edema within the right parietal lobe consistent with infarct. Low attenuation within the left parietal lobe with associated mild volume loss, suggesting prior infarct. Chronic appearing infarcts within the bilateral basal ganglia. Moderate small vessel ischemic change of the white matter. Nonenlarged ventricles. Vascular: No hyperdense vessels.  Carotid vascular calcification Skull: No fracture Sinuses/Orbits: Old appearing blowout fractures of the medial walls of both orbits. Old appearing nasal bone fracture. No acute abnormality Other: None  IMPRESSION: 1. Moderate area of edema/acute to subacute infarct in the right parietal lobe without evidence for hemorrhage or midline shift. 2. Suspected chronic infarct within the left parietal lobe. Chronic infarcts within the bilateral basal ganglia with small vessel ischemic changes of the white matter. Electronically Signed   By: Jasmine PangKim  Fujinaga M.D.   On: 03/17/2018 19:26   Mr Brain Wo Contrast  Result Date: 03/18/2018 CLINICAL DATA:  Mental status changes.  Confusion. EXAM: MRI HEAD WITHOUT CONTRAST MRA HEAD WITHOUT CONTRAST TECHNIQUE: Multiplanar, multiecho pulse sequences of the brain and surrounding structures were obtained without intravenous contrast. Angiographic images of the head were obtained using MRA technique without contrast. COMPARISON:  CT 03/17/2018 FINDINGS: MRI HEAD FINDINGS Brain: The study is markedly degraded by motion. There is a 6-7 cm region of acute infarction in the right middle cerebral artery territory affecting the posterior temporal lobe and parietal lobe. Few smaller patchy areas of acute infarction anterior to that at the frontoparietal junction region. Mild swelling but no sign of hemorrhage. Old basal ganglia infarctions bilaterally. No sign of hydrocephalus I think there may also be acute infarction in the inferior cerebellum on the left, though this is less certain because of motion artifact. Vascular: No vascular information on this exam. Skull and upper cervical spine: No abnormality seen. Sinuses/Orbits: Negative Other: None MRA HEAD FINDINGS Marked motion degradation. Both internal carotid arteries are patent through the skull base. There is at least some flow in the middle cerebral artery branches and in the anterior cerebral arteries. Beyond that, no information is available regarding the anterior circulation. Both vertebral arteries are patent to the basilar. The basilar artery is patent. Posterior circulation branch vessels are not visible due to motion.  IMPRESSION: Severely motion degraded exam. 6-7 cm region of acute infarction affecting  the right middle cerebral artery territory, with a few other smaller foci of infarction in that territory. Mild swelling but no evidence of hemorrhage on these limited images. Question small region of acute infarction in the inferior cerebellum on the left as well. If this is present, this would certainly elevate the likelihood of embolic disease from the heart or ascending aorta. If not a real finding, most likely source the embolus would be the right carotid bifurcation. Electronically Signed   By: Paulina Fusi M.D.   On: 03/18/2018 07:57   US Carotid Bilateral (at [facility] And [facility] Only)  Result Date: 03/18/2018 CLINICAL DATA:  Acute stroke, confusion.  Previous tobacco abuse. EXAM: BILATERAL CAROTID DUPLEX ULTRASOUND TECHNIQUE: Wallace Cullens scale imaging, color Doppler and duplex ultrasound were performed of bilateral carotid and vertebral arteries in the neck. COMPARISON:  None. FINDINGS: Criteria: Quantification of carotid stenosis is based on velocity parameters that correlate the residual internal carotid diameter with NASCET-based stenosis levels, using the diameter of the distal internal carotid lumen as the denominator for stenosis measurement. The following velocity measurements were obtained: RIGHT ICA: 51/14 cm/sec CCA: 54/8 cm/sec SYSTOLIC ICA/CCA RATIO:  0.9 ECA: 43 cm/sec LEFT ICA: 63/14 cm/sec CCA: 44/6 cm/sec SYSTOLIC ICA/CCA RATIO:  1.4 ECA: 42 cm/sec RIGHT CAROTID ARTERY: Calcified plaque in the bulb extending to the ICA origin. No high-grade stenosis. Normal waveforms and color Doppler signal. RIGHT VERTEBRAL ARTERY:  Normal flow direction and waveform. LEFT CAROTID ARTERY: Eccentric calcified plaque in the bulb. No high-grade stenosis. Normal waveforms and color Doppler signal. LEFT VERTEBRAL ARTERY:  Normal flow direction and waveform. IMPRESSION: 1. Bilateral carotid bifurcation plaque resulting in less than 50%  diameter ICA stenosis. 2.  Antegrade bilateral vertebral arterial flow. Electronically Signed   By: Corlis Leak M.D.   On: 03/18/2018 11:14   Dg Chest Port 1 View  Result Date: 03/17/2018 CLINICAL DATA:  Found wandering the streets confused, question stroke, hypertension, smoker EXAM: PORTABLE CHEST 1 VIEW COMPARISON:  Portable exam 2102 hours without priors for comparison FINDINGS: Normal heart size, mediastinal contours, and pulmonary vascularity. Lungs clear. No pleural effusion or pneumothorax. No acute osseous findings. IMPRESSION: No acute abnormalities. Electronically Signed   By: Ulyses Southward M.D.   On: 03/17/2018 21:25   Mr Maxine Glenn Head/brain ZO Cm  Result Date: 03/18/2018 CLINICAL DATA:  Mental status changes.  Confusion. EXAM: MRI HEAD WITHOUT CONTRAST MRA HEAD WITHOUT CONTRAST TECHNIQUE: Multiplanar, multiecho pulse sequences of the brain and surrounding structures were obtained without intravenous contrast. Angiographic images of the head were obtained using MRA technique without contrast. COMPARISON:  CT 03/17/2018 FINDINGS: MRI HEAD FINDINGS Brain: The study is markedly degraded by motion. There is a 6-7 cm region of acute infarction in the right middle cerebral artery territory affecting the posterior temporal lobe and parietal lobe. Few smaller patchy areas of acute infarction anterior to that at the frontoparietal junction region. Mild swelling but no sign of hemorrhage. Old basal ganglia infarctions bilaterally. No sign of hydrocephalus I think there may also be acute infarction in the inferior cerebellum on the left, though this is less certain because of motion artifact. Vascular: No vascular information on this exam. Skull and upper cervical spine: No abnormality seen. Sinuses/Orbits: Negative Other: None MRA HEAD FINDINGS Marked motion degradation. Both internal carotid arteries are patent through the skull base. There is at least some flow in the middle cerebral artery branches and in the  anterior cerebral arteries. Beyond that, no information is available regarding the  anterior circulation. Both vertebral arteries are patent to the basilar. The basilar artery is patent. Posterior circulation branch vessels are not visible due to motion. IMPRESSION: Severely motion degraded exam. 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 Mild swelling but no evidence of hemorrhage on these limited images. Question small region of acute infarction in the inferior cerebellum on the left as well. If this is present, this would certainly elevate the likelihood of embolic disease from the heart or ascending aorta. If not a real finding, most likely source the embolus would be the right carotid bifurcation. Electronically Signed   By: Paulina Fusi M.D.   On: 03/18/2018 07:57    Erick Blinks, MD  [facility] Pager [phone]  If 7PM-7AM, please contact night-coverage [email or web address] Password [email or web address] 03/29/2018, 2:50 PM   LOS: 12 days
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
## 2018-03-30 NOTE — Progress Notes (Signed)
Physical Therapy Treatment Patient Details Name: Adam Koch MRN: [id number] DOB: [birth year] Today's Date: 03/30/2018    History of Present Illness  Adam Koch is a 62 y.o. male with medical history significant of alcohol abuse, tobacco use, hypertension who is coming to the emergency department due to being found by EMS and RDP wandering on the streets confused.  He is unable to provide further history.  A friend of the patient talked to EMS and stated that he drinks regularly.  He smokes daily an unknown amount of cigarettes.  He is only oriented to name and does not follow most simple commands.    PT Comments    Patient able to follow most directions consistently, continues to neglect left side, bumped into door with left side when going through double doors in hallway, had to slow cadence when going up down ramp without loss of balance and continued sitting up in chair after therapy.  Patient will benefit from continued physical therapy in hospital and recommended venue below to increase strength, balance, endurance for safe ADLs and gait.   Follow Up Recommendations  SNF;Supervision/Assistance - 24 hour     Equipment Recommendations  None recommended by PT    Recommendations for Other Services       Precautions / Restrictions Precautions Precautions: Fall Precaution Comments: left side physical and visual neglect Restrictions Weight Bearing Restrictions: No    Mobility  Bed Mobility Overal bed mobility: Independent Bed Mobility: Supine to Sit           General bed mobility comments: Patient presents seated in chair (assisted by nursing staff)  Transfers Overall transfer level: Modified independent Equipment used: None Transfers: Sit to/from [employer] Sit to Stand: Modified independent (Device/Increase time) Stand pivot transfers: Modified independent (Device/Increase time)       General transfer comment: slightly labored with limited use  of LUE  Ambulation/Gait Ambulation/Gait assistance: Supervision Gait Distance (Feet): 250 Feet Assistive device: None Gait Pattern/deviations: Decreased step length - right;Decreased step length - left;Decreased stride length;Decreased dorsiflexion - left Gait velocity: decreased   General Gait Details: slow labored cadence with short step/stride length, limited left dorsiflexion resulting in short steps with lack of left heel strike/slow cadence   Stairs             Wheelchair Mobility    Modified Rankin (Stroke Patients Only)       Balance Overall balance assessment: Mild deficits observed, not formally tested   Sitting balance-Leahy Scale: Good     Standing balance support: During functional activity;No upper extremity supported Standing balance-Leahy Scale: Fair Standing balance comment: without AD                            Cognition Arousal/Alertness: Awake/alert Behavior During Therapy: Flat affect Overall Cognitive Status: Impaired/Different from baseline Area of Impairment: Attention;Orientation;Memory;Awareness                 Orientation Level: Person Current Attention Level: Selective     Safety/Judgement: Decreased awareness of deficits     General Comments: left sided neglect      Exercises General Exercises - Lower Extremity Long Arc Quad: AROM;Strengthening;Both;10 reps;Seated Hip Flexion/Marching: Seated;AROM;Strengthening;Both;10 reps    General Comments        Pertinent Vitals/Pain Pain Assessment: No/denies pain    Home Living  Prior Function            PT Goals (current goals can now be found in the care plan section) Acute Rehab PT Goals Patient Stated Goal: return home PT Goal Formulation: With patient Time For Goal Achievement: 04/01/18 Potential to Achieve Goals: Good Progress towards PT goals: Progressing toward goals    Frequency    Min 5X/week      PT  Plan Current plan remains appropriate    Co-evaluation              AM-PAC PT "6 Clicks" Daily Activity  Outcome Measure  Difficulty turning over in bed (including adjusting bedclothes, sheets and blankets)?: None Difficulty moving from lying on back to sitting on the side of the bed? : None Difficulty sitting down on and standing up from a chair with arms (e.g., wheelchair, bedside commode, etc,.)?: None Help needed moving to and from a bed to chair (including a wheelchair)?: A Little Help needed walking in hospital room?: A Little Help needed climbing 3-5 steps with a railing? : A Lot 6 Click Score: 20    End of Session Equipment Utilized During Treatment: Gait belt Activity Tolerance: Patient tolerated treatment well Patient left: in chair;with call bell/phone within reach;with chair alarm set Nurse Communication: Mobility status PT Visit Diagnosis: Unsteadiness on feet (R26.81);Other abnormalities of gait and mobility (R26.89);Muscle weakness (generalized) (M62.81)     Time: [id number] PT Time Calculation (min) (ACUTE ONLY): 21 min  Charges:  $Gait Training: 8-22 mins                     11:38 AM, 03/30/18 Ocie BobJames Charnae Lill, MPT Physical Therapist with [facility] [phone] office [phone] mobile phone

## 2018-03-30 NOTE — Plan of Care (Signed)
  Problem: Acute Rehab OT Goals (only OT should resolve) Goal: Pt. Will Perform Upper Body Dressing Outcome: Progressing

## 2018-03-30 NOTE — Progress Notes (Signed)
PROGRESS NOTE    Derryl HarborLonnie Forge  [id number] DOB: 11/29/1955 DOA: 03/17/2018 PCP: Patient, No Pcp Per   Brief Narrative:  62 year old male with a history of alcohol abuse, tobacco use, hypertension presented with altered mental status. Patient is unable to provide any history secondary to his encephalopathy. Apparently, the patient was agitated and confused on the afternoon of 03/17/2018. According to his girlfriend, the patient walked outside into the backyard in his underwear and began pulling out flowers. Because of his agitation, she called the police who arrived and recommended EMS to bring the patient to the hospital. According to the patient's girlfriend, the patient was "acting drunk"on 03/15/2018.She states that she last saw him normal on 03/14/2018. She states that he frequently goes out to drink alcohol after he gets paid, although she cannot elaborate how much and how often and the patient drinks. However, she states that he has drank alcohol regularly for nearly 20 years. The patient continues to smoke tobacco although the amount is unclear. The been no reports of chest pain, shortness breath, vomiting, diarrhea, abdominal pain, chest pain. In the emergency department, the patient was afebrile hemodynamically stable saturating 100% on room air. He was only oriented to name.  Assessment/Plan: Acute metabolic encephalopathy -Multifactorial including Wernicke's encephalopathy, stroke, and possibly hypertensive encephalopathyand THC -At baseline, the patient is alert and oriented x4 -Currently the patient is alert and oriented x 1 -Serum B12--589 -TSH--1.094 -Urinalysis--neg for pyuria -Folic acid--11.5 -RPR--neg -Ammonia--21 -UDS--+THC -Thiamine 500 mg q 8 hours x 6, then 100mg  daily -slow improvement--no longer agitated, but remains mildly confused  Acute right MCA infarct -Neurology Consultappreciated-->30 day event monitor -patient did have an episode of  tachycardia overnight, but this was sinus tachycardia on the monitor. Will continue to monitor -PT/OT evaluation-->SNF -Speech therapy eval-->regular diet with thin liquids -CT brain--Moderate area of edema/acute to subacute infarct in the right parietal lobe without evidence for hemorrhage or midline shift -MRI brain--acute infarctR-MCA territorywith few other smaller foci in the same territory;questionable infarct in the inferior cerebellum on the left -MRA brain--motion degraded, no hemodynamically significant stenosis -Carotid Duplex--negative for hemodynamically significant stenosis -CTA H&N--no large vessel occlusion, stable large right MCA infarct, mild interval increase in edema with mild mass-effect around area of infarct. Petechial hemorrhage of right parietal lobe. Distal right MCA parietal branch occlusion. -Echo--EF 55-60%, no WMA, no PFO noted -LDL--68 -HbA1C--5.4 -Antiplatelet--ASA 325 mg -9/13--case discussed by Dr. Arbutus Leasat with neurology, Dr. Lurene Shadowoonquah--anticipated vasogenic edema to gradually improve; No need for mannitol  Essential hypertension -currently on amlodipine. Blood pressure has been stable.   Severe Protein malnutrition -continue Ensure  Disposition Plan:SNFwhen bed available, DSS is working on patient's case and trying to get in touch with family.  Family Communication:motherupdatedon phone 03/22/18  Consultants:neurology  Code Status: FULL   DVT Prophylaxis: [place] Heparin    Procedures: As Listed in Progress Note Above  Antibiotics: None  Subjective: Patient seen and evaluated today with no new acute complaints or concerns. No acute concerns or events noted overnight.  He is only oriented to person.  Objective: Vitals:   03/29/18 2002 03/29/18 2203 03/30/18 0508 03/30/18 0920  BP:  140/72 126/76 (!) 158/85  Pulse:  73 65 68  Resp:  20 20   Temp:  97.8 F (36.6 C) 98.5 F (36.9 C)   TempSrc:  Oral Oral   SpO2: 98%  100% 100%   Weight:      Height:        Intake/Output Summary (Last 24 hours)  at 03/30/2018 1105 Last data filed at 03/30/2018 0900 Gross per 24 hour  Intake 600 ml  Output -  Net 600 ml   Filed Weights   03/17/18 1629 03/18/18 0028 03/24/18 1113  Weight: 72 kg 59.7 kg 62.8 kg    Examination:  General exam: Appears calm and comfortable  Respiratory system: Clear to auscultation. Respiratory effort normal. Cardiovascular system: S1 & S2 heard, RRR. No JVD, murmurs, rubs, gallops or clicks. No pedal edema. Gastrointestinal system: Abdomen is nondistended, soft and nontender. No organomegaly or masses felt. Normal bowel sounds heard. Central nervous system: Alert, but disoriented. Extremities: Symmetric 5 x 5 power. Skin: No rashes, lesions or ulcers Psychiatry: Cannot be appropriately assessed.    Data Reviewed: I have personally reviewed following labs and imaging studies  CBC: No results for input(s): WBC, NEUTROABS, HGB, HCT, MCV, PLT in the last 168 hours. Basic Metabolic Panel: No results for input(s): NA, K, CL, CO2, GLUCOSE, BUN, CREATININE, CALCIUM, MG, PHOS in the last 168 hours. GFR: Estimated Creatinine Clearance: 83.1 mL/min (by C-G formula based on SCr of 0.84 mg/dL). Liver Function Tests: No results for input(s): AST, ALT, ALKPHOS, BILITOT, PROT, ALBUMIN in the last 168 hours. No results for input(s): LIPASE, AMYLASE in the last 168 hours. No results for input(s): AMMONIA in the last 168 hours. Coagulation Profile: No results for input(s): INR, PROTIME in the last 168 hours. Cardiac Enzymes: No results for input(s): CKTOTAL, CKMB, CKMBINDEX, TROPONINI in the last 168 hours. BNP (last 3 results) No results for input(s): PROBNP in the last 8760 hours. HbA1C: No results for input(s): HGBA1C in the last 72 hours. CBG: No results for input(s): GLUCAP in the last 168 hours. Lipid Profile: No results for input(s): CHOL, HDL, LDLCALC, TRIG, CHOLHDL, LDLDIRECT in  the last 72 hours. Thyroid Function Tests: No results for input(s): TSH, T4TOTAL, FREET4, T3FREE, THYROIDAB in the last 72 hours. Anemia Panel: No results for input(s): VITAMINB12, FOLATE, FERRITIN, TIBC, IRON, RETICCTPCT in the last 72 hours. Sepsis Labs: No results for input(s): PROCALCITON, LATICACIDVEN in the last 168 hours.  No results found for this or any previous visit (from the past 240 hour(s)).       Radiology Studies: No results found.      Scheduled Meds: . amLODipine  5 mg Oral Daily  . aspirin  325 mg Oral Daily  . feeding supplement (ENSURE ENLIVE)  237 mL Oral TID BM  . folic acid  1 mg Oral Daily  . heparin  5,000 Units Subcutaneous Q8H  . multivitamin with minerals  1 tablet Oral Daily  . thiamine  100 mg Oral Daily   Continuous Infusions:   LOS: 13 days    Time spent: 30 minutes    Kemora Pinard Hoover Brunette, DO [facility] Pager [phone]  If 7PM-7AM, please contact night-coverage [email or web address] Password [email or web address] 03/30/2018, 11:05 AM

## 2018-03-31 NOTE — Clinical Social Work Note (Signed)
Message left for Hiram GashMelinda Spell with [facility] requesting update on guardianship situation.     Ryleeann Urquiza, Juleen ChinaHeather D, LCSW

## 2018-03-31 NOTE — Care Management Note (Signed)
Case Management Note  Patient Details  Name: Adam Koch MRN: [id number] Date of Birth: 05/07/1956   If discussed at Long Length of Stay Meetings, dates discussed:  03/31/18  Additional Comments:  Malcolm Metrohildress, Keriana Sarsfield Demske, RN 03/31/2018, 1:27 PM

## 2018-03-31 NOTE — Progress Notes (Signed)
PROGRESS NOTE    Adam Koch  [id number] DOB: 07/28/1955 DOA: 03/17/2018 PCP: Patient, No Pcp Per   Brief Narrative:   62 year old male with a history of alcohol abuse, tobacco use, hypertension presented with altered mental status. Patient is unable to provide any history secondary to his encephalopathy. Apparently, the patient was agitated and confused on the afternoon of 03/17/2018. According to his girlfriend, the patient walked outside into the backyard in his underwear and began pulling out flowers. Because of his agitation, she called the police who arrived and recommended EMS to bring the patient to the hospital. According to the patient's girlfriend, the patient was "acting drunk"on 03/15/2018.She states that she last saw him normal on 03/14/2018. She states that he frequently goes out to drink alcohol after he gets paid, although she cannot elaborate how much and how often and the patient drinks. However, she states that he has drank alcohol regularly for nearly 20 years. The patient continues to smoke tobacco although the amount is unclear. The been no reports of chest pain, shortness breath, vomiting, diarrhea, abdominal pain, chest pain. In the emergency department, the patient was afebrile hemodynamically stable saturating 100% on room air. He was only oriented to name.  Assessment/Plan: Acute metabolic encephalopathy -Multifactorial including Wernicke's encephalopathy, stroke, and possibly hypertensive encephalopathyand THC -At baseline, the patient is alert and oriented x4 -Currently the patient is alert and oriented x 1 -Serum B12--589 -TSH--1.094 -Urinalysis--neg for pyuria -Folic acid--11.5 -RPR--neg -Ammonia--21 -UDS--+THC -Thiamine 500 mg q 8 hours x 6, then 100mg  daily -slow improvement--no longer agitated, but remains mildly confused  Acute right MCA infarct -Neurology Consultappreciated-->30 day event monitor -patient did have an episode of  tachycardia overnight, but this was sinus tachycardia on the monitor. Will continue to monitor -PT/OT evaluation-->SNF -Speech therapy eval-->regular diet with thin liquids -CT brain--Moderate area of edema/acute to subacute infarct in the right parietal lobe without evidence for hemorrhage or midline shift -MRI brain--acute infarctR-MCA territorywith few other smaller foci in the same territory;questionable infarct in the inferior cerebellum on the left -MRA brain--motion degraded, no hemodynamically significant stenosis -Carotid Duplex--negative for hemodynamically significant stenosis -CTA H&N--no large vessel occlusion, stable large right MCA infarct, mild interval increase in edema with mild mass-effect around area of infarct. Petechial hemorrhage of right parietal lobe. Distal right MCA parietal branch occlusion. -Echo--EF 55-60%, no WMA, no PFO noted -LDL--68 -HbA1C--5.4 -Antiplatelet--ASA 325 mg -9/13--case discussed by Dr. Arbutus Leasat with neurology, Dr. Lurene Shadow[place]--anticipated vasogenic edema to gradually improve; No need for mannitol  Essential hypertension -currently on amlodipine.Blood pressure has been stable.  Severe Protein malnutrition -continue Ensure  Disposition Plan:SNFwhen bed available, DSS is working on patient's case and trying to get in touch with family.  Family Communication:motherupdatedon phone 03/22/18  Consultants:neurology  Code Status: FULL   DVT Prophylaxis: [place] Heparin    Procedures: As Listed in Progress Note Above  Antibiotics: None  Subjective: Patient seen and evaluated today with no new acute complaints or concerns. No acute concerns or events noted overnight. He is eating breakfast this morning and only oriented to person.  Objective: Vitals:   03/30/18 2037 03/30/18 2125 03/30/18 2125 03/31/18 0600  BP:  (!) 156/92  (!) 165/85  Pulse:  (!) 106 99 75  Resp:  18  16  Temp:  98 F (36.7 C)  (!) 97.5 F (36.4  C)  TempSrc:  Oral  Oral  SpO2: 99% 97% 100% 100%  Weight:      Height:  Intake/Output Summary (Last 24 hours) at 03/31/2018 1118 Last data filed at 03/30/2018 1817 Gross per 24 hour  Intake 480 ml  Output -  Net 480 ml   Filed Weights   03/17/18 1629 03/18/18 0028 03/24/18 1113  Weight: 72 kg 59.7 kg 62.8 kg    Examination:  General exam: Appears calm and comfortable  Respiratory system: Clear to auscultation. Respiratory effort normal. Cardiovascular system: S1 & S2 heard, RRR. No JVD, murmurs, rubs, gallops or clicks. No pedal edema. Gastrointestinal system: Abdomen is nondistended, soft and nontender. No organomegaly or masses felt. Normal bowel sounds heard. Central nervous system: Oriented to person only. No focal neurological deficits. Extremities: Symmetric 5 x 5 power. Skin: No rashes, lesions or ulcers Psychiatry: Judgement and insight appear normal. Mood & affect appropriate.     Data Reviewed: I have personally reviewed following labs and imaging studies  CBC: No results for input(s): WBC, NEUTROABS, HGB, HCT, MCV, PLT in the last 168 hours. Basic Metabolic Panel: No results for input(s): NA, K, CL, CO2, GLUCOSE, BUN, CREATININE, CALCIUM, MG, PHOS in the last 168 hours. GFR: Estimated Creatinine Clearance: 83.1 mL/min (by C-G formula based on SCr of 0.84 mg/dL). Liver Function Tests: No results for input(s): AST, ALT, ALKPHOS, BILITOT, PROT, ALBUMIN in the last 168 hours. No results for input(s): LIPASE, AMYLASE in the last 168 hours. No results for input(s): AMMONIA in the last 168 hours. Coagulation Profile: No results for input(s): INR, PROTIME in the last 168 hours. Cardiac Enzymes: No results for input(s): CKTOTAL, CKMB, CKMBINDEX, TROPONINI in the last 168 hours. BNP (last 3 results) No results for input(s): PROBNP in the last 8760 hours. HbA1C: No results for input(s): HGBA1C in the last 72 hours. CBG: No results for input(s): GLUCAP in the  last 168 hours. Lipid Profile: No results for input(s): CHOL, HDL, LDLCALC, TRIG, CHOLHDL, LDLDIRECT in the last 72 hours. Thyroid Function Tests: No results for input(s): TSH, T4TOTAL, FREET4, T3FREE, THYROIDAB in the last 72 hours. Anemia Panel: No results for input(s): VITAMINB12, FOLATE, FERRITIN, TIBC, IRON, RETICCTPCT in the last 72 hours. Sepsis Labs: No results for input(s): PROCALCITON, LATICACIDVEN in the last 168 hours.  No results found for this or any previous visit (from the past 240 hour(s)).       Radiology Studies: No results found.      Scheduled Meds: . amLODipine  5 mg Oral Daily  . aspirin  325 mg Oral Daily  . feeding supplement (ENSURE ENLIVE)  237 mL Oral TID BM  . folic acid  1 mg Oral Daily  . heparin  5,000 Units Subcutaneous Q8H  . multivitamin with minerals  1 tablet Oral Daily  . thiamine  100 mg Oral Daily   Continuous Infusions:   LOS: 14 days    Time spent: 15 minutes    Adam Querry Hoover Brunette, DO [facility] Pager [phone]  If 7PM-7AM, please contact night-coverage [email or web address] Password [email or web address] 03/31/2018, 11:18 AM

## 2018-03-31 NOTE — Progress Notes (Signed)
Physical Therapy Treatment Patient Details Name: Adam Koch MRN: [id number] DOB: 11/06/1955 Today's Date: 03/31/2018    History of Present Illness  Adam Koch is a 62 y.o. male with medical history significant of alcohol abuse, tobacco use, hypertension who is coming to the emergency department due to being found by EMS and RDP wandering on the streets confused.  He is unable to provide further history.  A friend of the patient talked to EMS and stated that he drinks regularly.  He smokes daily an unknown amount of cigarettes.  He is only oriented to name and does not follow most simple commands.    PT Comments    Pt received in bed and was agreeable to PT treatment. Initiated BLE coordination activities this date and pt demo'ing difficulty with LLE due to neglect, coordination, and proprioception deficits. He continues to have difficulty following verbal commands only and responds better to visual and tactile cueing. During amb, he walked 35300ft, no overt LOB, with slowed gait speed. He had difficulty completing L hand turns as he tried to turn R every time he was verbally commanded to turn L, and he required tactile cueing to complete the L turn. Pt needs continued acute services to address remaining deficits and continue to recommend venue below to further maximize his strength, balance, coordination, in order to decrease risk for falls.    Follow Up Recommendations  SNF;Supervision/Assistance - 24 hour     Equipment Recommendations  None recommended by PT    Recommendations for Other Services       Precautions / Restrictions Precautions Precautions: Fall Precaution Comments: left side physical and visual neglect Restrictions Weight Bearing Restrictions: No    Mobility  Bed Mobility Overal bed mobility: Independent             General bed mobility comments: Patient presents seated in chair (assisted by nursing staff)  Transfers Overall transfer level: Modified  independent Equipment used: None Transfers: Sit to/from [employer]GI CorporationStand;Stand Pivot Transfers Sit to Stand: Modified independent (Device/Increase time) Stand pivot transfers: Modified independent (Device/Increase time)          Ambulation/Gait Ambulation/Gait assistance: Supervision Gait Distance (Feet): 300 Feet Assistive device: None Gait Pattern/deviations: Decreased step length - right;Decreased step length - left;Decreased stride length;Decreased dorsiflexion - left Gait velocity: decreased   General Gait Details: continued L neglect during ambulation and requiring cues for Left turns and objects to the L of him. slow labored cadence with short step/stride length, limited left dorsiflexion resulting in short steps with lack of left heel strike/slow cadence   Stairs             Wheelchair Mobility    Modified Rankin (Stroke Patients Only)       Balance Overall balance assessment: Mild deficits observed, not formally tested   Sitting balance-Leahy Scale: Good     Standing balance support: During functional activity;No upper extremity supported Standing balance-Leahy Scale: Fair Standing balance comment: without AD                            Cognition Arousal/Alertness: Awake/alert Behavior During Therapy: Flat affect Overall Cognitive Status: Impaired/Different from baseline Area of Impairment: Attention;Orientation;Memory;Awareness                 Orientation Level: Person Current Attention Level: Selective     Safety/Judgement: Decreased awareness of deficits     General Comments: left sided neglect      Exercises General  Exercises - Lower Extremity Ankle Circles/Pumps: Limitations Ankle Circles/Pumps Limitations: attempted alternating heel/toe raises seated but pt with great difficulty sequencing movement with BLE as he tended to march hips vs DF/PF ankles Long Arc Quad: AROM;Strengthening;Both;10 reps;Seated Hip Flexion/Marching:  Seated;AROM;Strengthening;Both;10 reps Other Exercises Other Exercises: seated EOB - LLE toe tapping on 3 small bottles on ground towards his L side; he demo'd neglect of the farthest left bottle but with max visual, verbal, tactile cues he was able to see it, however, would tap it with his RLE until again cued to tap with LLE; moved the bottles further to the L to facilitate pt attaining to his L side more and he still had difficulty noticing the farthest L bottle without cueing; -- his ability to tap the bottles with the LLE was limited throughout due to coordination deficits Other Exercises: attempted NBOS balance with head turns but due to difficulty following commands, unable to complete task, however, his NBOS balance was not noted to be unsteady as he attained and maintained it for at least 30 sec when trying to get him to perform R/L head turns    General Comments        Pertinent Vitals/Pain Pain Assessment: No/denies pain    Home Living                      Prior Function            PT Goals (current goals can now be found in the care plan section) Acute Rehab PT Goals Patient Stated Goal: return home PT Goal Formulation: With patient Time For Goal Achievement: 04/01/18 Potential to Achieve Goals: Good    Frequency    Min 5X/week      PT Plan      Co-evaluation              AM-PAC PT "6 Clicks" Daily Activity  Outcome Measure  Difficulty turning over in bed (including adjusting bedclothes, sheets and blankets)?: None Difficulty moving from lying on back to sitting on the side of the bed? : None Difficulty sitting down on and standing up from a chair with arms (e.g., wheelchair, bedside commode, etc,.)?: None Help needed moving to and from a bed to chair (including a wheelchair)?: A Little Help needed walking in hospital room?: A Little Help needed climbing 3-5 steps with a railing? : A Lot 6 Click Score: 20    End of Session Equipment Utilized  During Treatment: Gait belt Activity Tolerance: Patient tolerated treatment well Patient left: in chair;with call bell/phone within reach;with chair alarm set Nurse Communication: Mobility status PT Visit Diagnosis: Unsteadiness on feet (R26.81);Other abnormalities of gait and mobility (R26.89);Muscle weakness (generalized) (M62.81)     Time: 1610-9604 PT Time Calculation (min) (ACUTE ONLY): 18 min  Charges:  $Therapeutic Activity: 8-22 mins                        Jac Canavan PT, DPT

## 2018-04-01 NOTE — Progress Notes (Signed)
Physical Therapy Treatment Patient Details Name: Adam Koch MRN: [id number] DOB: [birth year] Today's Date: 04/01/2018    History of Present Illness   Adam Koch is a 62 y.o. male with medical history significant of alcohol abuse, tobacco use, hypertension who is coming to the emergency department due to being found by EMS and RDP wandering on the streets confused.  He is unable to provide further history.  A friend of the patient talked to EMS and stated that he drinks regularly.  He smokes daily an unknown amount of cigarettes.  He is only oriented to name and does not follow most simple commands.    PT Comments    Pt sitting in chair upon therapist entrance and motivated to complete session today.  Pt independent with transfer sit to stand and ambulated slowly with no AD and no LOB noted.  Main deficits with gait include stride length and dorsiflexion for Lt LE.  Progressed to stair training with ability to ambulate reciprocal pattern ascending and step to pattern descending steps with 1 HR assistance.  Main deficits noted this session with LLE due to neglect, coordination and proprioception.  He continues to have difficulty with verbal commands and responds better to visual and tactile cueing.  EOS pt left in chair with call bell within reach and chair alarm set.   Follow Up Recommendations  SNF;Supervision/Assistance - 24 hour     Equipment Recommendations  None recommended by PT    Recommendations for Other Services       Precautions / Restrictions      Mobility  Bed Mobility                  Transfers                    Ambulation/Gait                 Stairs             Wheelchair Mobility    Modified Rankin (Stroke Patients Only)       Balance                                            Cognition                                              Exercises      General Comments         Pertinent Vitals/Pain      Home Living                      Prior Function            PT Goals (current goals can now be found in the care plan section) Progress towards PT goals: Progressing toward goals    Frequency    Min 5X/week      PT Plan Current plan remains appropriate    Co-evaluation              AM-PAC PT "6 Clicks" Daily Activity  Outcome Measure  Difficulty turning over in bed (including adjusting bedclothes, sheets and blankets)?: None Difficulty moving from lying on back to sitting on the side of the bed? : None  Difficulty sitting down on and standing up from a chair with arms (e.g., wheelchair, bedside commode, etc,.)?: None Help needed moving to and from a bed to chair (including a wheelchair)?: None Help needed walking in hospital room?: A Little Help needed climbing 3-5 steps with a railing? : A Lot 6 Click Score: 21    End of Session Equipment Utilized During Treatment: Gait belt Activity Tolerance: Patient tolerated treatment well Patient left: in chair;with call bell/phone within reach;with chair alarm set Nurse Communication: Mobility status PT Visit Diagnosis: Unsteadiness on feet (R26.81);Other abnormalities of gait and mobility (R26.89);Muscle weakness (generalized) (M62.81)     Time: 6578-4696 PT Time Calculation (min) (ACUTE ONLY): 18 min  Charges:  $Gait Training: 8-22 mins                    [address] LPTA; CBIS [phone]   Juel Burrow 04/01/2018, 5:49 PM

## 2018-04-01 NOTE — Progress Notes (Signed)
PROGRESS NOTE    Adam Koch  ZOX:[id number] DOB: 11-Sep-1955 DOA: 03/17/2018 PCP: Patient, No Pcp Per   Brief Narrative:   62 year old male with a history of alcohol abuse, tobacco use, hypertension presented with altered mental status. Patient is unable to provide any history secondary to his encephalopathy. Apparently, the patient was agitated and confused on the afternoon of 03/17/2018. According to his girlfriend, the patient walked outside into the backyard in his underwear and began pulling out flowers. Because of his agitation, she called the police who arrived and recommended EMS to bring the patient to the hospital. According to the patient's girlfriend, the patient was "acting drunk"on 03/15/2018.She states that she last saw him normal on 03/14/2018. She states that he frequently goes out to drink alcohol after he gets paid, although she cannot elaborate how much and how often and the patient drinks. However, she states that he has drank alcohol regularly for nearly 20 years. The patient continues to smoke tobacco although the amount is unclear. The been no reports of chest pain, shortness breath, vomiting, diarrhea, abdominal pain, chest pain. In the emergency department, the patient was afebrile hemodynamically stable saturating 100% on room air. He is only oriented to name.  Assessment/Plan: Acute metabolic encephalopathy -Multifactorial including Wernicke's encephalopathy, stroke, and possibly hypertensive encephalopathyand THC -At baseline, the patient is alert and oriented x4 -Currently the patient is alert and oriented x1 -Serum B12--589 -TSH--1.094 -Urinalysis--neg for pyuria -Folic acid--11.5 -RPR--neg -Ammonia--21 -UDS--+THC -Thiamine 500 mg q 8 hours x 6, then 100mg  daily -slow improvement--no longer agitated, but remains mildly confused  Acute right MCA infarct -Neurology Consultappreciated-->30 day event monitor -patient did have an episode of  tachycardia overnight, but this was sinus tachycardia on the monitor. Will continue to monitor -PT/OT evaluation-->SNF -Speech therapy eval-->regular diet with thin liquids -CT brain--Moderate area of edema/acute to subacute infarct in the right parietal lobe without evidence for hemorrhage or midline shift -MRI brain--acute infarctR-MCA territorywith few other smaller foci in the same territory;questionable infarct in the inferior cerebellum on the left -MRA brain--motion degraded, no hemodynamically significant stenosis -Carotid Duplex--negative for hemodynamically significant stenosis -CTA H&N--no large vessel occlusion, stable large right MCA infarct, mild interval increase in edema with mild mass-effect around area of infarct. Petechial hemorrhage of right parietal lobe. Distal right MCA parietal branch occlusion. -Echo--EF 55-60%, no WMA, no PFO noted -LDL--68 -HbA1C--5.4 -Antiplatelet--ASA 325 mg -9/13--case discussed by Dr. Arbutus Leas with neurology, Dr. Lurene Shadow vasogenic edema to gradually improve; No need for mannitol  Essential hypertension -currently on amlodipine.Blood pressure has been stable.  Severe Protein malnutrition -continue Ensure  Disposition Plan:SNFwhen bed available, DSS is working on patient's case and trying to get in touch with family.  Family Communication:motherupdatedon phone 03/22/18  Consultants:neurology  Code Status: FULL   DVT Prophylaxis: [place] Heparin    Procedures: As Listed in Progress Note Above  Antibiotics: None  Subjective: Patient seen and evaluated today with no new acute complaints or concerns. No acute concerns or events noted overnight. He is eating breakfast this morning and still only oriented to person.  Objective: Vitals:   03/31/18 1424 03/31/18 2129 03/31/18 2256 04/01/18 0630  BP: (!) 147/69 (!) 155/96  137/80  Pulse: 70 97  62  Resp: 18 16  18   Temp: 98.5 F (36.9 C) (!) 97.5  F (36.4 C)  98.1 F (36.7 C)  TempSrc: Oral Oral  Oral  SpO2: 98% 100% 100% 100%  Weight:      Height:  Intake/Output Summary (Last 24 hours) at 04/01/2018 1046 Last data filed at 04/01/2018 0900 Gross per 24 hour  Intake 840 ml  Output -  Net 840 ml   Filed Weights   03/17/18 1629 03/18/18 0028 03/24/18 1113  Weight: 72 kg 59.7 kg 62.8 kg    Examination:  General exam: Appears calm and comfortable  Respiratory system: Clear to auscultation. Respiratory effort normal. Cardiovascular system: S1 & S2 heard, RRR. No JVD, murmurs, rubs, gallops or clicks. No pedal edema. Gastrointestinal system: Abdomen is nondistended, soft and nontender. No organomegaly or masses felt. Normal bowel sounds heard. Central nervous system: Alert, but confused. Extremities: Symmetric 5 x 5 power. Skin: No rashes, lesions or ulcers Psychiatry: Judgement and insight appear normal. Mood & affect appropriate.     Data Reviewed: I have personally reviewed following labs and imaging studies  CBC: No results for input(s): WBC, NEUTROABS, HGB, HCT, MCV, PLT in the last 168 hours. Basic Metabolic Panel: No results for input(s): NA, K, CL, CO2, GLUCOSE, BUN, CREATININE, CALCIUM, MG, PHOS in the last 168 hours. GFR: Estimated Creatinine Clearance: 83.1 mL/min (by C-G formula based on SCr of 0.84 mg/dL). Liver Function Tests: No results for input(s): AST, ALT, ALKPHOS, BILITOT, PROT, ALBUMIN in the last 168 hours. No results for input(s): LIPASE, AMYLASE in the last 168 hours. No results for input(s): AMMONIA in the last 168 hours. Coagulation Profile: No results for input(s): INR, PROTIME in the last 168 hours. Cardiac Enzymes: No results for input(s): CKTOTAL, CKMB, CKMBINDEX, TROPONINI in the last 168 hours. BNP (last 3 results) No results for input(s): PROBNP in the last 8760 hours. HbA1C: No results for input(s): HGBA1C in the last 72 hours. CBG: No results for input(s): GLUCAP in the  last 168 hours. Lipid Profile: No results for input(s): CHOL, HDL, LDLCALC, TRIG, CHOLHDL, LDLDIRECT in the last 72 hours. Thyroid Function Tests: No results for input(s): TSH, T4TOTAL, FREET4, T3FREE, THYROIDAB in the last 72 hours. Anemia Panel: No results for input(s): VITAMINB12, FOLATE, FERRITIN, TIBC, IRON, RETICCTPCT in the last 72 hours. Sepsis Labs: No results for input(s): PROCALCITON, LATICACIDVEN in the last 168 hours.  No results found for this or any previous visit (from the past 240 hour(s)).       Radiology Studies: No results found.      Scheduled Meds: . amLODipine  5 mg Oral Daily  . aspirin  325 mg Oral Daily  . feeding supplement (ENSURE ENLIVE)  237 mL Oral TID BM  . folic acid  1 mg Oral Daily  . heparin  5,000 Units Subcutaneous Q8H  . multivitamin with minerals  1 tablet Oral Daily  . thiamine  100 mg Oral Daily   Continuous Infusions:   LOS: 15 days    Time spent: 15 minutes    Adam Marsan Hoover Brunette, DO [facility] Pager [phone]  If 7PM-7AM, please contact night-coverage [email or web address] Password [facility] 04/01/2018, 10:46 AM

## 2018-04-01 NOTE — Progress Notes (Signed)
  Speech Language Pathology Treatment: Cognitive-Linquistic  Patient Details Name: Adam Koch MRN: [id number] DOB: 05/05/1956 Today's Date: 04/01/2018 Time: [id number] SLP Time Calculation (min) (ACUTE ONLY): 23 min  Assessment / Plan / Recommendation Clinical Impression  Pt seen for ongoing cognitive linguistic intervention in acute setting. Pt remains in acute care setting due to difficulty with disposition planning. Pt received upright in chair in room with lunch meal. He demonstrates improved attention to his left and was able to locate 6/6 requested items on his tray with indirect cues. He required phonemic and fill in the blank cues for orientation questions and benefited from written cues in environment. He demonstrated awareness of paraphasic sound substitutions when completing naming activities for items in his environment. SLP remained in room to document while Pt consumed lunch meal and he became tearful. SLP provided verbal encouragement, however Pt did not verbalize reason for tears, but frequently stated, "Yes M'am" when SLP offered solace. SLP will follow during acute stay as schedule permits. Written cues left in room for orientation.   HPI HPI: Adam [name] a [age] y.o.malewith medical history significant of alcohol abuse, tobacco use, hypertension who is coming to the emergency department due to being found by EMS and RDP wandering on the streets confused. He is unable to provide further history. A friend of the patient talked to EMS and stated that he drinks regularly. He smokes daily an unknown amount of cigarettes. He is only oriented to name and does not follow most simple commands. Severely motion degraded exam. 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 Mild swelling but no evidence of hemorrhage on these limited images. Question small region of acute infarction in the  inferior cerebellum on the left as well. Pt failed RN swallow screen and RN requested BSE.      SLP Plan  Continue with current plan of care       Recommendations                   Oral Care Recommendations: Oral care BID Follow up Recommendations: Skilled Nursing facility;24 hour supervision/assistance;Home health SLP SLP Visit Diagnosis: Cognitive communication deficit (R41.841) Attention and concentration deficit following: Cerebral infarction Plan: Continue with current plan of care       Thank you,  Havery MorosDabney Hoy Fallert, CCC-SLP [phone]                 Adam Koch 04/01/2018, 12:34 PM

## 2018-04-01 NOTE — Progress Notes (Signed)
Occupational Therapy Treatment Patient Details Name: Adam Koch MRN: [id number] DOB: 1955-09-20 Today's Date: 04/01/2018    History of present illness  Adam Koch is a 62 y.o. male with medical history significant of alcohol abuse, tobacco use, hypertension who is coming to the emergency department due to being found by EMS and RDP wandering on the streets confused.  He is unable to provide further history.  A friend of the patient talked to EMS and stated that he drinks regularly.  He smokes daily an unknown amount of cigarettes.  He is only oriented to name and does not follow most simple commands.   OT comments  Pt received sitting up in bed, agreeable to OT treatment. Pt performing all ADLs at supervision level, occasional verbal or visual cuing to initiate or follow through with task. Pt using BUE for tasks today without cuing to include LUE. Pt requiring set-up for donning gown and managing ties behind neck. At this time pt has met all goals for ADL performance at supervision level. No further OT services required at this time. Pt will require 24/7 supervision upon discharge from hospital.    Follow Up Recommendations  SNF;Supervision/Assistance - 24 hour    Equipment Recommendations  None recommended by OT       Precautions / Restrictions Precautions Precautions: Fall Precaution Comments: left side physical and visual neglect       Mobility Bed Mobility Overal bed mobility: Independent                Transfers Overall transfer level: Modified independent Equipment used: None Transfers: Sit to/from [employer] Sit to Stand: Modified independent (Device/Increase time) Stand pivot transfers: Modified independent (Device/Increase time)                ADL either performed or assessed with clinical judgement   ADL Overall ADL's : Needs assistance/impaired     Grooming: Wash/dry hands;Wash/dry face;Oral  care;Supervision/safety;Standing Grooming Details (indicate cue type and reason): occasional verbal cuing to turn off water and initiate tasks         Upper Body Dressing : Set up;Sitting Upper Body Dressing Details (indicate cue type and reason): Pt able to thread arms through gown with cuing to intiate task. OT providing assist to tie behind neck.                  Functional mobility during ADLs: Supervision/safety General ADL Comments: Verbal and visual cuing to iniate and follow through with tasks               Cognition Arousal/Alertness: Awake/alert Behavior During Therapy: Flat affect Overall Cognitive Status: Impaired/Different from baseline Area of Impairment: Attention;Orientation;Memory;Awareness                 Orientation Level: Person Current Attention Level: Selective     Safety/Judgement: Decreased awareness of deficits     General Comments: left sided neglect                   Pertinent Vitals/ Pain       Pain Assessment: No/denies pain         Frequency  Min 1X/week        Progress Toward Goals  OT Goals(current goals can now be found in the care plan section)  Progress towards OT goals: Goals drowngraded-see care plan;Goals met/education completed, patient discharged from OT  Acute Rehab OT Goals Patient Stated Goal: return home OT Goal Formulation: Patient unable to participate in goal  setting Time For Goal Achievement: 04/02/18 Potential to Achieve Goals: Good ADL Goals Pt Will Perform Grooming: with supervision;standing Pt Will Perform Upper Body Dressing: with set-up;with supervision;sitting Pt Will Perform Lower Body Dressing: with supervision;sitting/lateral leans;sit to/from stand Pt Will Transfer to Toilet: with supervision;ambulating;regular height toilet Pt Will Perform Toileting - Clothing Manipulation and hygiene: with supervision;sitting/lateral leans;sit to/from stand  Plan All goals met and education  completed, patient discharged from OT services          End of Session Equipment Utilized During Treatment: Other (comment)(none)  OT Visit Diagnosis: Muscle weakness (generalized) (M62.81);Other symptoms and signs involving cognitive function   Activity Tolerance Patient tolerated treatment well   Patient Left in chair;with call bell/phone within reach;with chair alarm set           Time: [phone] OT Time Calculation (min): 20 min  Charges: OT General Charges $OT Visit: 1 Visit OT Treatments $Self Care/Home Management : 8-22 mins    Guadelupe Sabin, OTR/L  [phone] 04/01/2018, 9:11 AM

## 2018-04-02 NOTE — Progress Notes (Signed)
PROGRESS NOTE    Adam Koch  ZOX:[id number] DOB: 07/20/1955 DOA: 03/17/2018 PCP: Patient, No Pcp Per   Brief Narrative:   62 year old male with a history of alcohol abuse, tobacco use, hypertension presented with altered mental status. Patient is unable to provide any history secondary to his encephalopathy. Apparently, the patient was agitated and confused on the afternoon of 03/17/2018. According to his girlfriend, the patient walked outside into the backyard in his underwear and began pulling out flowers. Because of his agitation, she called the police who arrived and recommended EMS to bring the patient to the hospital. According to the patient's girlfriend, the patient was "acting drunk"on 03/15/2018.She states that she last saw him normal on 03/14/2018. She states that he frequently goes out to drink alcohol after he gets paid, although she cannot elaborate how much and how often and the patient drinks. However, she states that he has drank alcohol regularly for nearly 20 years. The patient continues to smoke tobacco although the amount is unclear. The been no reports of chest pain, shortness breath, vomiting, diarrhea, abdominal pain, chest pain. In the emergency department, the patient was afebrile hemodynamically stable saturating 100% on room air. He is only oriented to name.  Assessment/Plan: Acute metabolic encephalopathy -Multifactorial including Wernicke's encephalopathy, stroke, and possibly hypertensive encephalopathyand THC -At baseline, the patient is alert and oriented x4 -Currently the patient is alert and oriented x1 -Serum B12--589 -TSH--1.094 -Urinalysis--neg for pyuria -Folic acid--11.5 -RPR--neg -Ammonia--21 -UDS--+THC -Thiamine 500 mg q 8 hours x 6, then 100mg  daily -slow improvement--no longer agitated, but remains mildly confused  Acute right MCA infarct -Neurology Consultappreciated-->30 day event monitor -patient did have an episode of  tachycardia overnight, but this was sinus tachycardia on the monitor. Will continue to monitor -PT/OT evaluation-->SNF -Speech therapy eval-->regular diet with thin liquids -CT brain--Moderate area of edema/acute to subacute infarct in the right parietal lobe without evidence for hemorrhage or midline shift -MRI brain--acute infarctR-MCA territorywith few other smaller foci in the same territory;questionable infarct in the inferior cerebellum on the left -MRA brain--motion degraded, no hemodynamically significant stenosis -Carotid Duplex--negative for hemodynamically significant stenosis -CTA H&N--no large vessel occlusion, stable large right MCA infarct, mild interval increase in edema with mild mass-effect around area of infarct. Petechial hemorrhage of right parietal lobe. Distal right MCA parietal branch occlusion. -Echo--EF 55-60%, no WMA, no PFO noted -LDL--68 -HbA1C--5.4 -Antiplatelet--ASA 325 mg -9/13--case discussed by Dr. Arbutus Leas with neurology, Dr. Lurene Shadow vasogenic edema to gradually improve; No need for mannitol  Essential hypertension -currently on amlodipine.Blood pressure has been stable.  Severe Protein malnutrition -continue Ensure  Disposition Plan:SNFwhen bed available, DSS is working on patient's case and trying to get in touch with family.  Family Communication:motherupdatedon phone 03/22/18  Consultants:neurology  Code Status: FULL   DVT Prophylaxis: [place] Heparin    Procedures: As Listed in Progress Note Above  Antibiotics: None  Subjective: Patient seen and evaluated today with no new acute complaints or concerns. No acute concerns or events noted overnight.He is eating breakfast this morning and still only oriented to person.  Objective: Vitals:   04/01/18 1439 04/01/18 2042 04/01/18 2145 04/02/18 0700  BP: 130/84  130/77 137/69  Pulse: 75  81 70  Resp: 18  15 18   Temp: 98.5 F (36.9 C)  98.3 F (36.8  C) 98 F (36.7 C)  TempSrc:   Oral Other (Comment)  SpO2: 100% 100% 96% 100%  Weight:      Height:  Intake/Output Summary (Last 24 hours) at 04/02/2018 1114 Last data filed at 04/01/2018 1700 Gross per 24 hour  Intake 600 ml  Output -  Net 600 ml   Filed Weights   03/17/18 1629 03/18/18 0028 03/24/18 1113  Weight: 72 kg 59.7 kg 62.8 kg    Examination:  General exam: Appears calm and comfortable  Respiratory system: Clear to auscultation. Respiratory effort normal. Cardiovascular system: S1 & S2 heard, RRR. No JVD, murmurs, rubs, gallops or clicks. No pedal edema. Gastrointestinal system: Abdomen is nondistended, soft and nontender. No organomegaly or masses felt. Normal bowel sounds heard. Central nervous system: Alert and oriented. No focal neurological deficits. Extremities: Symmetric 5 x 5 power. Skin: No rashes, lesions or ulcers Psychiatry: Judgement and insight appear normal. Mood & affect appropriate.     Data Reviewed: I have personally reviewed following labs and imaging studies  CBC: No results for input(s): WBC, NEUTROABS, HGB, HCT, MCV, PLT in the last 168 hours. Basic Metabolic Panel: No results for input(s): NA, K, CL, CO2, GLUCOSE, BUN, CREATININE, CALCIUM, MG, PHOS in the last 168 hours. GFR: Estimated Creatinine Clearance: 83.1 mL/min (by C-G formula based on SCr of 0.84 mg/dL). Liver Function Tests: No results for input(s): AST, ALT, ALKPHOS, BILITOT, PROT, ALBUMIN in the last 168 hours. No results for input(s): LIPASE, AMYLASE in the last 168 hours. No results for input(s): AMMONIA in the last 168 hours. Coagulation Profile: No results for input(s): INR, PROTIME in the last 168 hours. Cardiac Enzymes: No results for input(s): CKTOTAL, CKMB, CKMBINDEX, TROPONINI in the last 168 hours. BNP (last 3 results) No results for input(s): PROBNP in the last 8760 hours. HbA1C: No results for input(s): HGBA1C in the last 72 hours. CBG: No results for  input(s): GLUCAP in the last 168 hours. Lipid Profile: No results for input(s): CHOL, HDL, LDLCALC, TRIG, CHOLHDL, LDLDIRECT in the last 72 hours. Thyroid Function Tests: No results for input(s): TSH, T4TOTAL, FREET4, T3FREE, THYROIDAB in the last 72 hours. Anemia Panel: No results for input(s): VITAMINB12, FOLATE, FERRITIN, TIBC, IRON, RETICCTPCT in the last 72 hours. Sepsis Labs: No results for input(s): PROCALCITON, LATICACIDVEN in the last 168 hours.  No results found for this or any previous visit (from the past 240 hour(s)).       Radiology Studies: No results found.      Scheduled Meds: . amLODipine  5 mg Oral Daily  . aspirin  325 mg Oral Daily  . feeding supplement (ENSURE ENLIVE)  237 mL Oral TID BM  . folic acid  1 mg Oral Daily  . heparin  5,000 Units Subcutaneous Q8H  . multivitamin with minerals  1 tablet Oral Daily  . thiamine  100 mg Oral Daily   Continuous Infusions:   LOS: 16 days    Time spent: 30 minutes    Jacquez Sheetz Hoover Brunette, DO [facility] Pager [phone]  If 7PM-7AM, please contact night-coverage [email or web address] Password [email or web address] 04/02/2018, 11:14 AM

## 2018-04-02 NOTE — Clinical Social Work Note (Signed)
Late entry for 04/01/18.   LCSW received a message from [facility] with [facility] APS who stated that they had taken interim guardianship of patient and had started his long term Medicaid application.  LCSW will refer patient out to additional facilities with updated guardianship and payor source information.   Jaryiah Mehlman, [name], LCSW

## 2018-04-02 NOTE — Progress Notes (Signed)
Nutrition Follow-up  DOCUMENTATION CODES:  Non-severe (moderate) malnutrition in context of social or environmental circumstances, Underweight  INTERVENTION:  Offered patient some comfort foods. Will place on tray.   Continue Ensure Enlive po TID, each supplement provides 350 kcal and 20 grams of protein  NUTRITION DIAGNOSIS:  Severe Malnutrition related to social / environmental circumstances as evidenced by severe muscle/fat depletion  Ongoing  GOAL:  Patient will meet greater than or equal to 90% of their needs  MET  MONITOR:  PO intake, Supplement acceptance, Weight trends, Skin, I & O's  ASSESSMENT:  62 year old male who presented to the ED on 9/10 after being found wandering in the streets confused. PMH significant for EtOH abuse, tobacco use, hypertension. Pt found to have acute CVA.  Following up with patient due to prolonged length of stay.   Disposition is ongoing as CSW seeking guardianship for patient. DSS also working on case.   Per review of meal documentation. Patient has eaten 100% of every single documented meal since 9/13. In addition, he has Ensure ENlive ordered TID and has been consumed the 9 of these. Prior to that time, he was refusing them about 50% of the time.   Patient alert and up in chair. He answers all the majority of questions with either a "yes sir" or "no sir".   He has a good appetite. He has no complaints with the foods. He feels good. No n/v/c/d. He likes the Ensure and wants to continue consuming them TID.   RD asked offered some comfort items (ice cream). He accepted this.   Pt has not been weighed since 9/17. Did not seek weight because this would not change RD interventions; he is already consuming 100% of meals and 3x ensure supplements.   His intake well exceeds his estimated needs: 3 ensures and 100% of meals will provide him over 3000 kcals and >140 g pro.   Labs: BG: 103, a1C: 5.4 Meds: Ensure Enlive TID- Consuming most of them.  MVI with min, Thiamin, Folate  Diet Order:   Diet Order            Diet regular Room service appropriate? Yes; Fluid consistency: Thin  Diet effective now             EDUCATION NEEDS:  No education needs have been identified at this time  Skin:  Skin Assessment: Reviewed RN Assessment  Last BM:  9/24  Height:  Ht Readings from Last 1 Encounters:  03/18/18 6' 1" (1.854 m)   Weight:  Wt Readings from Last 1 Encounters:  03/24/18 62.8 kg   Wt Readings from Last 10 Encounters:  03/24/18 62.8 kg  05/04/12 72.6 kg   Ideal Body Weight:  83.64 kg  BMI:  Body mass index is 18.27 kg/m.  Estimated Nutritional Needs:  Kcal:  1900-2200 (30-35 kcal/kg bw) Protein:  85-100 g Pro (1.4-1.6 g/kg bw) Fluid:  >1.9 L fluid (30 ml/kg bw)  Burtis Junes RD, LDN, CNSC Clinical Nutrition Available Tues-Sat via Pager: [phone] 04/02/2018 3:06 PM

## 2018-04-02 NOTE — NC FL2 (Signed)
[place] MEDICAID FL2 LEVEL OF CARE SCREENING TOOL     IDENTIFICATION  Patient Name: Adam Koch Birthdate: 1956/01/29 Sex: male Admission Date (Current Location): 03/17/2018  [place] Number:  [facility] [facility] is interim guardian and they started long term IT trainer and Address:  [facility],  [address], [place] [address]      Provider Number: [id number]  Attending Physician Name and Address:  Erick Blinks, DO  Relative Name and Phone Number:  [facility] has interim guardianship.    Current Level of Care: Hospital Recommended Level of Care: Skilled Nursing Facility Prior Approval Number:    Date Approved/Denied:   PASRR Number: [id number])  Discharge Plan: SNF    Current Diagnoses: Patient Active Problem List   Diagnosis Date Noted  . Wernicke encephalopathy 03/22/2018  . Protein-calorie malnutrition, severe 03/19/2018  . Acute right MCA stroke (HCC) 03/18/2018  . Acute metabolic encephalopathy 03/18/2018  . Acute CVA (cerebrovascular accident) (HCC) 03/17/2018  . Alcohol abuse 03/17/2018  . Hypertension 03/17/2018  . Tobacco use 03/17/2018  . Leukocytosis 03/17/2018    Orientation RESPIRATION BLADDER Height & Weight     Self  Normal Continent Weight: 138 lb 7.2 oz (62.8 kg) Height:  6\' 1"  (185.4 cm)  BEHAVIORAL SYMPTOMS/MOOD NEUROLOGICAL BOWEL NUTRITION STATUS      Continent Diet(Regular)  AMBULATORY STATUS COMMUNICATION OF NEEDS Skin   Limited Assist Verbally Normal                       Personal Care Assistance Level of Assistance  Bathing, Feeding, Dressing Bathing Assistance: Maximum assistance Feeding assistance: Limited assistance Dressing Assistance: Maximum assistance     Functional Limitations Info  Sight, Hearing, Speech Sight Info: Adequate Hearing Info: Adequate Speech Info: Adequate    SPECIAL  CARE FACTORS FREQUENCY  PT (By licensed PT)     PT Frequency: 5x/week              Contractures Contractures Info: Not present    Additional Factors Info  Code Status, Allergies Code Status Info: Full code Allergies Info: NKA           Current Medications (04/02/2018):  This is the current hospital active medication list Current Facility-Administered Medications  Medication Dose Route Frequency Provider Last Rate Last Dose  . acetaminophen (TYLENOL) tablet 650 mg  650 mg Oral Q4H PRN Bobette Mo, MD       Or  . acetaminophen (TYLENOL) solution 650 mg  650 mg Per Tube Q4H PRN Bobette Mo, MD       Or  . acetaminophen (TYLENOL) suppository 650 mg  650 mg Rectal Q4H PRN Bobette Mo, MD      . amLODipine (NORVASC) tablet 5 mg  5 mg Oral Daily Tat, Onalee Hua, MD   5 mg at 04/02/18 8413  . aspirin tablet 325 mg  325 mg Oral Daily Tat, Onalee Hua, MD   325 mg at 04/02/18 2440  . feeding supplement (ENSURE ENLIVE) (ENSURE ENLIVE) liquid 237 mL  237 mL Oral TID BM Catarina Hartshorn, MD   237 mL at 04/02/18 0929  . folic acid (FOLVITE) tablet 1 mg  1 mg Oral Daily Bobette Mo, MD   1 mg at 04/02/18 0928  . heparin injection 5,000 Units  5,000 Units Subcutaneous Q8H Bobette Mo, MD   5,000 Units at 04/02/18 [phone]  . hydrALAZINE (APRESOLINE) injection 10 mg  10 mg Intravenous Q6H PRN Tat, David, MD      . multivitamin with minerals tablet 1 tablet  1 tablet Oral Daily Bobette Mo, MD   1 tablet at 04/02/18 [phone]  . ondansetron (ZOFRAN) tablet 4 mg  4 mg Oral Q6H PRN Bobette Mo, MD       Or  . ondansetron [facility]) injection 4 mg  4 mg Intravenous Q6H PRN Bobette Mo, MD      . senna-docusate (Senokot-S) tablet 1 tablet  1 tablet Oral QHS PRN Bobette Mo, MD      . thiamine (VITAMIN B-1) tablet 100 mg  100 mg Oral Daily Tat, Onalee Hua, MD   100 mg at 04/02/18 6295     Discharge Medications: Please see discharge summary for a list of  discharge medications.  Relevant Imaging Results:  Relevant Lab Results:   Additional Information SSN: [id number].   Jamie Hafford, [name], LCSW

## 2018-04-03 DIAGNOSIS — E440 Moderate protein-calorie malnutrition: Secondary | ICD-10-CM

## 2018-04-03 NOTE — Progress Notes (Signed)
Physical Therapy Treatment Patient Details Name: Adam Koch MRN: [id number] DOB: February 12, 1956 Today's Date: 04/03/2018    History of Present Illness  Adam Koch is a 62 y.o. male with medical history significant of alcohol abuse, tobacco use, hypertension who is coming to the emergency department due to being found by EMS and RDP wandering on the streets confused.  He is unable to provide further history.  A friend of the patient talked to EMS and stated that he drinks regularly.  He smokes daily an unknown amount of cigarettes.  He is only oriented to name and does not follow most simple commands.    PT Comments    Pt was able to demonstrate an ability to detect and control movement with balance being managed by external forces.  Pt will demonstrate control of fall risk, mainly with no AD and with increased control offered by PT.  Pt is able to assess his position in the hall with large turns to L and cues verbally and physically by PT.  Follow Up Recommendations  SNF     Equipment Recommendations  None recommended by PT    Recommendations for Other Services       Precautions / Restrictions Precautions Precautions: Fall Precaution Comments: left side physical and visual neglect Restrictions Weight Bearing Restrictions: No    Mobility  Bed Mobility Overal bed mobility: Needs Assistance Bed Mobility: Supine to Sit;Sit to Supine     Supine to sit: Min guard Sit to supine: Min guard      Transfers Overall transfer level: Needs assistance Equipment used: 1 person hand held assist Transfers: Sit to/from Stand Sit to Stand: Min guard            Ambulation/Gait Ambulation/Gait assistance: Min guard Gait Distance (Feet): 180 Feet Assistive device: 1 person hand held assist Gait Pattern/deviations: Decreased step length - right;Decreased step length - left;Decreased stride length;Decreased dorsiflexion - left Gait velocity: decreased   General Gait Details:  continued L neglect during ambulation and requiring cues for Left turns and objects to the L of him. slow labored cadence with short step/stride length, limited left dorsiflexion resulting in short steps with lack of left heel strike/slow cadence   Stairs             Wheelchair Mobility    Modified Rankin (Stroke Patients Only) Modified Rankin (Stroke Patients Only) Pre-Morbid Rankin Score: No symptoms Modified Rankin: Moderate disability     Balance Overall balance assessment: Mild deficits observed, not formally tested Sitting-balance support: Feet supported Sitting balance-Leahy Scale: Good     Standing balance support: Single extremity supported;During functional activity Standing balance-Leahy Scale: Fair                              Cognition Arousal/Alertness: Awake/alert Behavior During Therapy: Flat affect Overall Cognitive Status: Impaired/Different from baseline Area of Impairment: Following commands;Safety/judgement;Awareness;Problem solving                   Current Attention Level: Selective Memory: Decreased short-term memory;Decreased recall of precautions Following Commands: Follows one step commands with increased time Safety/Judgement: Decreased awareness of safety;Decreased awareness of deficits Awareness: Intellectual Problem Solving: Requires verbal cues;Requires tactile cues        Exercises General Exercises - Lower Extremity Ankle Circles/Pumps: AROM;AAROM;Both;5 reps Long Arc Quad: AROM;Strengthening;Both;10 reps Straight Leg Raises: AROM;Right;Left;10 reps Hip Flexion/Marching: Seated;AROM;Strengthening;Both;10 reps    General Comments General comments (skin integrity, edema, etc.): pt  has tolerated LE exercises but is slow to follow cues      Pertinent Vitals/Pain Pain Assessment: No/denies pain    Home Living                      Prior Function            PT Goals (current goals can now be  found in the care plan section) Acute Rehab PT Goals Patient Stated Goal: return home PT Goal Formulation: With patient Time For Goal Achievement: 04/17/18 Progress towards PT goals: PT to reassess next treatment    Frequency    Min 5X/week      PT Plan Current plan remains appropriate    Co-evaluation              AM-PAC PT "6 Clicks" Daily Activity  Outcome Measure  Difficulty turning over in bed (including adjusting bedclothes, sheets and blankets)?: A Little Difficulty moving from lying on back to sitting on the side of the bed? : Unable Difficulty sitting down on and standing up from a chair with arms (e.g., wheelchair, bedside commode, etc,.)?: A Little Help needed moving to and from a bed to chair (including a wheelchair)?: A Little Help needed walking in hospital room?: A Little Help needed climbing 3-5 steps with a railing? : A Lot 6 Click Score: 15    End of Session Equipment Utilized During Treatment: Gait belt Activity Tolerance: Patient tolerated treatment well Patient left: in chair;with call bell/phone within reach;with chair alarm set Nurse Communication: Mobility status PT Visit Diagnosis: Unsteadiness on feet (R26.81);Other abnormalities of gait and mobility (R26.89);Muscle weakness (generalized) (M62.81)     Time: 1610-9604 PT Time Calculation (min) (ACUTE ONLY): 23 min  Charges:  $Gait Training: 8-22 mins $Therapeutic Exercise: 8-22 mins                  Ivar Drape 04/03/2018, 9:23 PM    Samul Dada, PT MS Acute Rehab Dept. Number: [facility] [id number] and [facility] [phone]

## 2018-04-03 NOTE — Progress Notes (Signed)
PROGRESS NOTE    Adam Koch  WUJ:[id number] DOB: 1956-04-21 DOA: 03/17/2018 PCP: Patient, No Pcp Per   Brief Narrative:   62 year old male with a history of alcohol abuse, tobacco use, hypertension presented with altered mental status. Patient is unable to provide any history secondary to his encephalopathy. Apparently, the patient was agitated and confused on the afternoon of 03/17/2018. According to his girlfriend, the patient walked outside into the backyard in his underwear and began pulling out flowers. Because of his agitation, she called the police who arrived and recommended EMS to bring the patient to the hospital. According to the patient's girlfriend, the patient was "acting drunk"on 03/15/2018.She states that she last saw him normal on 03/14/2018. She states that he frequently goes out to drink alcohol after he gets paid, although she cannot elaborate how much and how often and the patient drinks. However, she states that he has drank alcohol regularly for nearly 20 years. The patient continues to smoke tobacco although the amount is unclear. The been no reports of chest pain, shortness breath, vomiting, diarrhea, abdominal pain, chest pain. In the emergency department, the patient was afebrile hemodynamically stable saturating 100% on room air. He is only oriented to name.  Assessment/Plan:  Updated 04/03/18  Acute metabolic encephalopathy -Multifactorial including Wernicke's encephalopathy, stroke, and possibly hypertensive encephalopathyand THC -04/03/18:  Pt appears to be at baseline, the patient remains alert and oriented x4 -Serum B12--589 -TSH--1.094 -Urinalysis--neg for pyuria -Folic acid--11.5 -RPR--neg -Ammonia--21 -UDS--+THC -Thiamine 500 mg q 8 hours x 6, then 100mg  daily  Acute right MCA infarct -Neurology Consultappreciated-->30 day event monitor recommended.  -PT/OT evaluation-->SNF -Speech therapy eval-->regular diet with thin liquids -CT  brain--Moderate area of edema/acute to subacute infarct in the right parietal lobe without evidence for hemorrhage or midline shift -MRI brain--acute infarctR-MCA territorywith few other smaller foci in the same territory;questionable infarct in the inferior cerebellum on the left -MRA brain--motion degraded, no hemodynamically significant stenosis -Carotid Duplex--negative for hemodynamically significant stenosis -CTA H&N--no large vessel occlusion, stable large right MCA infarct, mild interval increase in edema with mild mass-effect around area of infarct. Petechial hemorrhage of right parietal lobe. Distal right MCA parietal branch occlusion. -Echo--EF 55-60%, no WMA, no PFO noted -LDL--68 -HbA1C--5.4 -Antiplatelet--ASA 325 mg -9/13--case discussed by Dr. Arbutus Leas with neurology, Dr. Lurene Shadow vasogenic edema to gradually improve; No need for mannitol  Essential hypertension -currently on amlodipine.Blood pressure has been mostly well controlled.  Severe Protein malnutrition -continue Ensure  Disposition Plan:SNFwhen bed available, DSS involved.  Family Communication:motherupdatedon phone 03/22/18  Consultants:neurology  Code Status: FULL   DVT Prophylaxis: [place] Heparin    Procedures: As Listed in Progress Note Above  Antibiotics: None  Subjective: Patient without specific complaints.  He is asking to eat this morning.   Objective: Vitals:   04/02/18 1439 04/02/18 2037 04/02/18 2053 04/03/18 0552  BP: 126/82  129/72 (!) 148/97  Pulse: (!) 106  81 73  Resp: 20  18 18   Temp: 97.8 F (36.6 C)  98.5 F (36.9 C) 98 F (36.7 C)  TempSrc:   Oral   SpO2: 100% 94% 98% 100%  Weight:      Height:        Intake/Output Summary (Last 24 hours) at 04/03/2018 1027 Last data filed at 04/03/2018 0552 Gross per 24 hour  Intake 960 ml  Output -  Net 960 ml   Filed Weights   03/17/18 1629 03/18/18 0028 03/24/18 1113  Weight: 72 kg 59.7 kg  62.8 kg  Examination:  General exam: thin male, sitting up in bed, NAD. Appears calm and comfortable.   Respiratory system: Clear to auscultation. Respiratory effort normal. Cardiovascular system: S1 & S2 heard. No JVD, murmurs, rubs, gallops or clicks. No pedal edema. Gastrointestinal system: Abdomen is nondistended, soft and nontender. No organomegaly or masses felt. Normal bowel sounds heard. Central nervous system: Alert and oriented. No focal neurological deficits. Extremities: Symmetric 5 x 5 power. Skin: No rashes, lesions or ulcers Psychiatry: Judgement and insight appear normal. Mood & affect appropriate.   Data Reviewed: I have personally reviewed following labs and imaging studies  CBC: No results for input(s): WBC, NEUTROABS, HGB, HCT, MCV, PLT in the last 168 hours. Basic Metabolic Panel: No results for input(s): NA, K, CL, CO2, GLUCOSE, BUN, CREATININE, CALCIUM, MG, PHOS in the last 168 hours. GFR: Estimated Creatinine Clearance: 83.1 mL/min (by C-G formula based on SCr of 0.84 mg/dL). Liver Function Tests: No results for input(s): AST, ALT, ALKPHOS, BILITOT, PROT, ALBUMIN in the last 168 hours. No results for input(s): LIPASE, AMYLASE in the last 168 hours. No results for input(s): AMMONIA in the last 168 hours. Coagulation Profile: No results for input(s): INR, PROTIME in the last 168 hours. Cardiac Enzymes: No results for input(s): CKTOTAL, CKMB, CKMBINDEX, TROPONINI in the last 168 hours. BNP (last 3 results) No results for input(s): PROBNP in the last 8760 hours. HbA1C: No results for input(s): HGBA1C in the last 72 hours. CBG: No results for input(s): GLUCAP in the last 168 hours. Lipid Profile: No results for input(s): CHOL, HDL, LDLCALC, TRIG, CHOLHDL, LDLDIRECT in the last 72 hours. Thyroid Function Tests: No results for input(s): TSH, T4TOTAL, FREET4, T3FREE, THYROIDAB in the last 72 hours. Anemia Panel: No results for input(s): VITAMINB12, FOLATE,  FERRITIN, TIBC, IRON, RETICCTPCT in the last 72 hours. Sepsis Labs: No results for input(s): PROCALCITON, LATICACIDVEN in the last 168 hours.  No results found for this or any previous visit (from the past 240 hour(s)).  Radiology Studies: No results found.  Scheduled Meds: . amLODipine  5 mg Oral Daily  . aspirin  325 mg Oral Daily  . feeding supplement (ENSURE ENLIVE)  237 mL Oral TID BM  . folic acid  1 mg Oral Daily  . heparin  5,000 Units Subcutaneous Q8H  . multivitamin with minerals  1 tablet Oral Daily  . thiamine  100 mg Oral Daily   Continuous Infusions:   LOS: 17 days   Time spent: 25 minutes  Standley Dakins, MD [facility] Pager [phone]  If 7PM-7AM, please contact night-coverage [email or web address] Password [facility] 04/03/2018, 10:27 AM

## 2018-04-03 NOTE — Clinical Social Work Psych Note (Signed)
CSW following. Working with DSS to find placement for pt on a 30 day LOG while Medicaid is pending. Hoping to have placement finalized by Monday next week. Will follow up then.

## 2018-04-04 NOTE — Progress Notes (Signed)
PROGRESS NOTE    Adam Koch  ZOX:[id number] DOB: 06/16/1956 DOA: 03/17/2018 PCP: Patient, No Pcp Per   Brief Narrative:   62 year old male with a history of alcohol abuse, tobacco use, hypertension presented with altered mental status. Patient is unable to provide any history secondary to his encephalopathy. Apparently, the patient was agitated and confused on the afternoon of 03/17/2018. According to his girlfriend, the patient walked outside into the backyard in his underwear and began pulling out flowers. Because of his agitation, she called the police who arrived and recommended EMS to bring the patient to the hospital. According to the patient's girlfriend, the patient was "acting drunk"on 03/15/2018.She states that she last saw him normal on 03/14/2018. She states that he frequently goes out to drink alcohol after he gets paid, although she cannot elaborate how much and how often and the patient drinks. However, she states that he has drank alcohol regularly for nearly 20 years. The patient continues to smoke tobacco although the amount is unclear. The been no reports of chest pain, shortness breath, vomiting, diarrhea, abdominal pain, chest pain. In the emergency department, the patient was afebrile hemodynamically stable saturating 100% on room air. He is only oriented to name.  Assessment/Plan:  Updated 04/04/18  Acute metabolic encephalopathy -Multifactorial including Wernicke's encephalopathy, stroke, and possibly hypertensive encephalopathyand THC -04/03/18:  Pt appears to be at baseline, the patient remains alert and oriented x4 -Serum B12--589 -TSH--1.094 -Urinalysis--neg for pyuria -Folic acid--11.5 -RPR--neg -Ammonia--21 -UDS--+THC -Thiamine 500 mg q 8 hours x 6, then 100mg  daily  Acute right MCA infarct -Neurology Consultappreciated-->30 day event monitor recommended.  -PT/OT evaluation-->SNF -Speech therapy eval-->regular diet with thin liquids -CT  brain--Moderate area of edema/acute to subacute infarct in the right parietal lobe without evidence for hemorrhage or midline shift -MRI brain--acute infarctR-MCA territorywith few other smaller foci in the same territory;questionable infarct in the inferior cerebellum on the left -MRA brain--motion degraded, no hemodynamically significant stenosis -Carotid Duplex--negative for hemodynamically significant stenosis -CTA H&N--no large vessel occlusion, stable large right MCA infarct, mild interval increase in edema with mild mass-effect around area of infarct. Petechial hemorrhage of right parietal lobe. Distal right MCA parietal branch occlusion. -Echo--EF 55-60%, no WMA, no PFO noted -LDL--68 -HbA1C--5.4 -Antiplatelet--ASA 325 mg -9/13--case discussed by Dr. Arbutus Leas with neurology, Dr. Lurene Shadow vasogenic edema to gradually improve; No need for mannitol  Essential hypertension -currently on amlodipine.Blood pressure has been mostly well controlled.  Severe Protein malnutrition -continue Ensure  Disposition Plan:SNFwhen bed available, DSS involved.  Family Communication:motherupdatedon phone 03/22/18  Consultants:neurology  Code Status: FULL   DVT Prophylaxis: [place] Heparin    Procedures: As Listed in Progress Note Above  Antibiotics: None  Subjective: Denies any complaints. No shortness of breath or chest pain  Objective: Vitals:   04/03/18 0552 04/03/18 1459 04/03/18 2043 04/04/18 0639  BP: (!) 148/97 131/76 115/73 (!) 147/83  Pulse: 73 73 86 71  Resp: 18 18 18 18   Temp: 98 F (36.7 C) 98.2 F (36.8 C) 98.5 F (36.9 C) 98.2 F (36.8 C)  TempSrc:  Oral Oral   SpO2: 100% 100% 98% 100%  Weight:      Height:        Intake/Output Summary (Last 24 hours) at 04/04/2018 1807 Last data filed at 04/04/2018 1259 Gross per 24 hour  Intake 240 ml  Output -  Net 240 ml   Filed Weights   03/17/18 1629 03/18/18 0028 03/24/18 1113    Weight: 72 kg 59.7 kg 62.8 kg  Examination:  General exam: Alert, awake, no distress Respiratory system: Clear to auscultation. Respiratory effort normal. Cardiovascular system:RRR. No murmurs, rubs, gallops. Gastrointestinal system: Abdomen is nondistended, soft and nontender. No organomegaly or masses felt. Normal bowel sounds heard. Central nervous system:No focal neurological deficits. Extremities: No C/C/E, +pedal pulses Skin: No rashes, lesions or ulcers Psychiatry: Judgement and insight appear normal. Mood & affect appropriate.     Data Reviewed: I have personally reviewed following labs and imaging studies  CBC: No results for input(s): WBC, NEUTROABS, HGB, HCT, MCV, PLT in the last 168 hours. Basic Metabolic Panel: No results for input(s): NA, K, CL, CO2, GLUCOSE, BUN, CREATININE, CALCIUM, MG, PHOS in the last 168 hours. GFR: Estimated Creatinine Clearance: 83.1 mL/min (by C-G formula based on SCr of 0.84 mg/dL). Liver Function Tests: No results for input(s): AST, ALT, ALKPHOS, BILITOT, PROT, ALBUMIN in the last 168 hours. No results for input(s): LIPASE, AMYLASE in the last 168 hours. No results for input(s): AMMONIA in the last 168 hours. Coagulation Profile: No results for input(s): INR, PROTIME in the last 168 hours. Cardiac Enzymes: No results for input(s): CKTOTAL, CKMB, CKMBINDEX, TROPONINI in the last 168 hours. BNP (last 3 results) No results for input(s): PROBNP in the last 8760 hours. HbA1C: No results for input(s): HGBA1C in the last 72 hours. CBG: No results for input(s): GLUCAP in the last 168 hours. Lipid Profile: No results for input(s): CHOL, HDL, LDLCALC, TRIG, CHOLHDL, LDLDIRECT in the last 72 hours. Thyroid Function Tests: No results for input(s): TSH, T4TOTAL, FREET4, T3FREE, THYROIDAB in the last 72 hours. Anemia Panel: No results for input(s): VITAMINB12, FOLATE, FERRITIN, TIBC, IRON, RETICCTPCT in the last 72 hours. Sepsis Labs: No  results for input(s): PROCALCITON, LATICACIDVEN in the last 168 hours.  No results found for this or any previous visit (from the past 240 hour(s)).  Radiology Studies: No results found.  Scheduled Meds: . amLODipine  5 mg Oral Daily  . aspirin  325 mg Oral Daily  . feeding supplement (ENSURE ENLIVE)  237 mL Oral TID BM  . folic acid  1 mg Oral Daily  . heparin  5,000 Units Subcutaneous Q8H  . multivitamin with minerals  1 tablet Oral Daily  . thiamine  100 mg Oral Daily   Continuous Infusions:   LOS: 18 days   Time spent: 15 minutes  Erick Blinks, MD [facility] Pager [phone]  If 7PM-7AM, please contact night-coverage [email or web address] Password [facility] 04/04/2018, 6:07 PM

## 2018-04-05 NOTE — Progress Notes (Signed)
PROGRESS NOTE    Adam Koch  WUJ:[id number] DOB: [birth year] DOA: 03/17/2018 PCP: Patient, No Pcp Per   Brief Narrative:   62 year old male with a history of alcohol abuse, tobacco use, hypertension presented with altered mental status. Patient is unable to provide any history secondary to his encephalopathy. Apparently, the patient was agitated and confused on the afternoon of 03/17/2018. According to his girlfriend, the patient walked outside into the backyard in his underwear and began pulling out flowers. Because of his agitation, she called the police who arrived and recommended EMS to bring the patient to the hospital. According to the patient's girlfriend, the patient was "acting drunk"on 03/15/2018.She states that she last saw him normal on 03/14/2018. She states that he frequently goes out to drink alcohol after he gets paid, although she cannot elaborate how much and how often and the patient drinks. However, she states that he has drank alcohol regularly for nearly 20 years. The patient continues to smoke tobacco although the amount is unclear. The been no reports of chest pain, shortness breath, vomiting, diarrhea, abdominal pain, chest pain. In the emergency department, the patient was afebrile hemodynamically stable saturating 100% on room air. He is only oriented to name.  Assessment/Plan:  Updated 04/05/18  Acute metabolic encephalopathy -Multifactorial including Wernicke's encephalopathy, stroke, and possibly hypertensive encephalopathyand THC -04/03/18:  Pt appears to be at baseline, the patient remains alert and oriented x4 -Serum B12--589 -TSH--1.094 -Urinalysis--neg for pyuria -Folic acid--11.5 -RPR--neg -Ammonia--21 -UDS--+THC -Thiamine 500 mg q 8 hours x 6, then 100mg  daily  Acute right MCA infarct -Neurology Consultappreciated-->30 day event monitor recommended.  -PT/OT evaluation-->SNF -Speech therapy eval-->regular diet with thin liquids -CT  brain--Moderate area of edema/acute to subacute infarct in the right parietal lobe without evidence for hemorrhage or midline shift -MRI brain--acute infarctR-MCA territorywith few other smaller foci in the same territory;questionable infarct in the inferior cerebellum on the left -MRA brain--motion degraded, no hemodynamically significant stenosis -Carotid Duplex--negative for hemodynamically significant stenosis -CTA H&N--no large vessel occlusion, stable large right MCA infarct, mild interval increase in edema with mild mass-effect around area of infarct. Petechial hemorrhage of right parietal lobe. Distal right MCA parietal branch occlusion. -Echo--EF 55-60%, no WMA, no PFO noted -LDL--68 -HbA1C--5.4 -Antiplatelet--ASA 325 mg -9/13--case discussed by Dr. Arbutus Leas with neurology, Dr. Lurene Shadow vasogenic edema to gradually improve; No need for mannitol  Essential hypertension -currently on amlodipine.blood pressure has been stable  Severe Protein malnutrition -continue Ensure  Disposition Plan:SNFwhen bed available, DSS involved.  Family Communication:motherupdatedon phone 03/22/18  Consultants:neurology  Code Status: FULL   DVT Prophylaxis: [place] Heparin    Procedures: As Listed in Progress Note Above  Antibiotics: None  Subjective: No dizziness, shortness of breath or chest pain  Objective: Vitals:   04/04/18 1300 04/04/18 2212 04/05/18 0601 04/05/18 1439  BP: 127/82 (!) 154/86 131/87 132/83  Pulse: 75 86 75 75  Resp:   20 18  Temp: 98.5 F (36.9 C) 98.4 F (36.9 C) 98.1 F (36.7 C) 98 F (36.7 C)  TempSrc: Oral Oral Oral   SpO2: 100% 100% 100% 100%  Weight:      Height:        Intake/Output Summary (Last 24 hours) at 04/05/2018 1638 Last data filed at 04/05/2018 1500 Gross per 24 hour  Intake 840 ml  Output -  Net 840 ml   Filed Weights   03/17/18 1629 03/18/18 0028 03/24/18 1113  Weight: 72 kg 59.7 kg 62.8 kg     Examination:  General  exam: Alert, awake, no distress Respiratory system: Clear to auscultation. Respiratory effort normal. Cardiovascular system:RRR. No murmurs, rubs, gallops. Gastrointestinal system: Abdomen is nondistended, soft and nontender. No organomegaly or masses felt. Normal bowel sounds heard.  Data Reviewed: I have personally reviewed following labs and imaging studies  CBC: No results for input(s): WBC, NEUTROABS, HGB, HCT, MCV, PLT in the last 168 hours. Basic Metabolic Panel: No results for input(s): NA, K, CL, CO2, GLUCOSE, BUN, CREATININE, CALCIUM, MG, PHOS in the last 168 hours. GFR: Estimated Creatinine Clearance: 83.1 mL/min (by C-G formula based on SCr of 0.84 mg/dL). Liver Function Tests: No results for input(s): AST, ALT, ALKPHOS, BILITOT, PROT, ALBUMIN in the last 168 hours. No results for input(s): LIPASE, AMYLASE in the last 168 hours. No results for input(s): AMMONIA in the last 168 hours. Coagulation Profile: No results for input(s): INR, PROTIME in the last 168 hours. Cardiac Enzymes: No results for input(s): CKTOTAL, CKMB, CKMBINDEX, TROPONINI in the last 168 hours. BNP (last 3 results) No results for input(s): PROBNP in the last 8760 hours. HbA1C: No results for input(s): HGBA1C in the last 72 hours. CBG: No results for input(s): GLUCAP in the last 168 hours. Lipid Profile: No results for input(s): CHOL, HDL, LDLCALC, TRIG, CHOLHDL, LDLDIRECT in the last 72 hours. Thyroid Function Tests: No results for input(s): TSH, T4TOTAL, FREET4, T3FREE, THYROIDAB in the last 72 hours. Anemia Panel: No results for input(s): VITAMINB12, FOLATE, FERRITIN, TIBC, IRON, RETICCTPCT in the last 72 hours. Sepsis Labs: No results for input(s): PROCALCITON, LATICACIDVEN in the last 168 hours.  No results found for this or any previous visit (from the past 240 hour(s)).  Radiology Studies: No results found.  Scheduled Meds: . amLODipine  5 mg Oral Daily  .  aspirin  325 mg Oral Daily  . feeding supplement (ENSURE ENLIVE)  237 mL Oral TID BM  . folic acid  1 mg Oral Daily  . heparin  5,000 Units Subcutaneous Q8H  . multivitamin with minerals  1 tablet Oral Daily  . thiamine  100 mg Oral Daily   Continuous Infusions:   LOS: 19 days   Time spent: 15 minutes  Erick Blinks, MD [facility] Pager [phone]  If 7PM-7AM, please contact night-coverage [email or web address] Password [facility] 04/05/2018, 4:38 PM

## 2018-04-06 NOTE — Progress Notes (Signed)
Physical Therapy Treatment Patient Details Name: Hudsen Fei MRN: [id number] DOB: [birth year] Today's Date: 04/06/2018    History of Present Illness  Oaklen Shober is a 62 y.o. male with medical history significant of alcohol abuse, tobacco use, hypertension who is coming to the emergency department due to being found by EMS and RDP wandering on the streets confused.  He is unable to provide further history.  A friend of the patient talked to EMS and stated that he drinks regularly.  He smokes daily an unknown amount of cigarettes.  He is only oriented to name and does not follow most simple commands.    PT Comments    Patient demonstrates good return for sit to stands, transfers, ambulation on level, inclined, declined surfaces and on stairs in stair well without loss of balance.  Patient continues to have difficulty processing information when attempting exercises with neglect of LLE, demonstrates improvement for turning to the left during gait training with occasional verbal cueing.  PLAN:  Patient discharged from physical therapy to care of nursing for ambulation daily as tolerated for length of stay.   Follow Up Recommendations  Supervision/Assistance - 24 hour;Supervision for mobility/OOB     Equipment Recommendations  None recommended by PT    Recommendations for Other Services       Precautions / Restrictions Precautions Precautions: None Precaution Comments: left side physical and visual neglect Restrictions Weight Bearing Restrictions: No    Mobility  Bed Mobility Overal bed mobility: Independent                Transfers Overall transfer level: Modified independent   Transfers: Sit to/from Stand;Stand Pivot Transfers Sit to Stand: Modified independent (Device/Increase time) Stand pivot transfers: Modified independent (Device/Increase time)       General transfer comment: increased time, but no loss of balance  Ambulation/Gait Ambulation/Gait assistance:  Supervision Gait Distance (Feet): 200 Feet Assistive device: None Gait Pattern/deviations: Decreased step length - right;Decreased stride length Gait velocity: slightly decreased   General Gait Details: demonstrates increased left armswing after given verbal cues, increased left heel to toe stepping with longer stride length, no loss of balance on level, inclined or declined surfaces   Stairs Stairs: Yes Stairs assistance: Modified independent (Device/Increase time) Stair Management: One rail Right;One rail Left;Alternating pattern;Step to pattern Number of Stairs: 18 General stair comments: good return for going up steps using 1 siderail with alternating pattern and using 1 siderail coming down with step to pattern with no loss of balance   Wheelchair Mobility    Modified Rankin (Stroke Patients Only)       Balance Overall balance assessment: Mild deficits observed, not formally tested Sitting-balance support: Feet supported;No upper extremity supported Sitting balance-Leahy Scale: Good     Standing balance support: During functional activity;No upper extremity supported Standing balance-Leahy Scale: Fair Standing balance comment: fair/good without AD                            Cognition Arousal/Alertness: Awake/alert Behavior During Therapy: WFL for tasks assessed/performed Overall Cognitive Status: Impaired/Different from baseline Area of Impairment: Orientation;Problem solving                 Orientation Level: Disoriented to;Place;Time Current Attention Level: Sustained   Following Commands: Follows one step commands with increased time     Problem Solving: Difficulty sequencing General Comments: left sided neglect, has diffiuclty completing exercises with LLE requiring repeated verbal/tactile cueing  Exercises General Exercises - Lower Extremity Long Arc Quad: AROM;Strengthening;Both;10 reps;Seated Hip Flexion/Marching:  Seated;AROM;Strengthening;Both;10 reps Heel Raises: Standing;Strengthening;10 reps;Both    General Comments        Pertinent Vitals/Pain Pain Assessment: No/denies pain    Home Living                      Prior Function            PT Goals (current goals can now be found in the care plan section) Acute Rehab PT Goals Patient Stated Goal: return home PT Goal Formulation: With patient Time For Goal Achievement: 04/06/18 Potential to Achieve Goals: Good Progress towards PT goals: Goals met/education completed, patient discharged from PT    Frequency           PT Plan Other (comment)(Patient to be discharged to care of nursing for ambulation daily as tolerated)    Co-evaluation              AM-PAC PT "6 Clicks" Daily Activity  Outcome Measure  Difficulty turning over in bed (including adjusting bedclothes, sheets and blankets)?: None Difficulty moving from lying on back to sitting on the side of the bed? : None Difficulty sitting down on and standing up from a chair with arms (e.g., wheelchair, bedside commode, etc,.)?: None Help needed moving to and from a bed to chair (including a wheelchair)?: None Help needed walking in hospital room?: None Help needed climbing 3-5 steps with a railing? : None 6 Click Score: 24    End of Session Equipment Utilized During Treatment: Gait belt Activity Tolerance: Patient tolerated treatment well Patient left: in chair;with chair alarm set Nurse Communication: Mobility status PT Visit Diagnosis: Unsteadiness on feet (R26.81);Other abnormalities of gait and mobility (R26.89);Muscle weakness (generalized) (M62.81)     Time: 1829-9371 PT Time Calculation (min) (ACUTE ONLY): 29 min  Charges:  $Gait Training: 8-22 mins $Therapeutic Exercise: 8-22 mins                     2:42 PM, 04/06/18 Lonell Grandchild, MPT Physical Therapist with [facility] [phone] office [phone] mobile phone

## 2018-04-06 NOTE — Progress Notes (Signed)
PROGRESS NOTE    Adam Koch  WJX:[id number] DOB: 05-04-1956 DOA: 03/17/2018 PCP: Patient, No Pcp Per   Brief Narrative:   62 year old male with a history of alcohol abuse, tobacco use, hypertension presented with altered mental status. Patient is unable to provide any history secondary to his encephalopathy. Apparently, the patient was agitated and confused on the afternoon of 03/17/2018. According to his girlfriend, the patient walked outside into the backyard in his underwear and began pulling out flowers. Because of his agitation, she called the police who arrived and recommended EMS to bring the patient to the hospital. According to the patient's girlfriend, the patient was "acting drunk"on 03/15/2018.She states that she last saw him normal on 03/14/2018. She states that he frequently goes out to drink alcohol after he gets paid, although she cannot elaborate how much and how often and the patient drinks. However, she states that he has drank alcohol regularly for nearly 20 years. The patient continues to smoke tobacco although the amount is unclear. The been no reports of chest pain, shortness breath, vomiting, diarrhea, abdominal pain, chest pain. In the emergency department, the patient was afebrile hemodynamically stable saturating 100% on room air. He is only oriented to name.  Assessment/Plan:  Updated 04/06/18  Acute metabolic encephalopathy -Multifactorial including Wernicke's encephalopathy, stroke, and possibly hypertensive encephalopathyand THC -04/06/18:  Pt appears to be at baseline, he is currently alert and oriented x3 -Serum B12--589 -TSH--1.094 -Urinalysis--neg for pyuria -Folic acid--11.5 -RPR--neg -Ammonia--21 -UDS--+THC -Thiamine 100mg  daily  Acute right MCA infarct -Neurology Consultappreciated-->30 day event monitor recommended.  -PT/OT evaluation-->SNF -Speech therapy eval-->regular diet with thin liquids -CT brain--Moderate area of edema/acute  to subacute infarct in the right parietal lobe without evidence for hemorrhage or midline shift -MRI brain--acute infarctR-MCA territorywith few other smaller foci in the same territory;questionable infarct in the inferior cerebellum on the left -MRA brain--motion degraded, no hemodynamically significant stenosis -Carotid Duplex--negative for hemodynamically significant stenosis -CTA H&N--no large vessel occlusion, stable large right MCA infarct, mild interval increase in edema with mild mass-effect around area of infarct. Petechial hemorrhage of right parietal lobe. Distal right MCA parietal branch occlusion. -Echo--EF 55-60%, no WMA, no PFO noted -LDL--68 -HbA1C--5.4 -Antiplatelet--ASA 325 mg -9/13--case discussed by Dr. Arbutus Leas with neurology, Dr. Lurene Shadow vasogenic edema to gradually improve; No need for mannitol  Essential hypertension -currently on amlodipine.blood pressure has been stable  Severe Protein malnutrition -continue Ensure  Disposition Plan:SNFwhen bed available, DSS involved.  Family Communication:cousin, Delphine Jonesupdatedon phone 04/06/18  Consultants:neurology  Code Status: FULL   DVT Prophylaxis: [place] Heparin    Procedures: As Listed in Progress Note Above  Antibiotics: None  Subjective: No new complaints  Objective: Vitals:   04/05/18 1439 04/05/18 2249 04/06/18 0530 04/06/18 1444  BP: 132/83 132/86 115/72 (!) 139/95  Pulse: 75 90 67 76  Resp: 18 15 12 16   Temp: 98 F (36.7 C) 98.8 F (37.1 C) 98.1 F (36.7 C) 98 F (36.7 C)  TempSrc:  Oral Oral Oral  SpO2: 100% 99% 99% 100%  Weight:      Height:        Intake/Output Summary (Last 24 hours) at 04/06/2018 1518 Last data filed at 04/06/2018 1300 Gross per 24 hour  Intake 480 ml  Output -  Net 480 ml   Filed Weights   03/17/18 1629 03/18/18 0028 03/24/18 1113  Weight: 72 kg 59.7 kg 62.8 kg    Examination:  General exam: Alert,  awake Respiratory system: Clear to auscultation. Respiratory effort normal.  Cardiovascular system:RRR. No murmurs, rubs, gallops. Gastrointestinal system: Abdomen is nondistended, soft and nontender. No organomegaly or masses felt. Normal bowel sounds heard.  Data Reviewed: I have personally reviewed following labs and imaging studies  CBC: No results for input(s): WBC, NEUTROABS, HGB, HCT, MCV, PLT in the last 168 hours. Basic Metabolic Panel: No results for input(s): NA, K, CL, CO2, GLUCOSE, BUN, CREATININE, CALCIUM, MG, PHOS in the last 168 hours. GFR: Estimated Creatinine Clearance: 83.1 mL/min (by C-G formula based on SCr of 0.84 mg/dL). Liver Function Tests: No results for input(s): AST, ALT, ALKPHOS, BILITOT, PROT, ALBUMIN in the last 168 hours. No results for input(s): LIPASE, AMYLASE in the last 168 hours. No results for input(s): AMMONIA in the last 168 hours. Coagulation Profile: No results for input(s): INR, PROTIME in the last 168 hours. Cardiac Enzymes: No results for input(s): CKTOTAL, CKMB, CKMBINDEX, TROPONINI in the last 168 hours. BNP (last 3 results) No results for input(s): PROBNP in the last 8760 hours. HbA1C: No results for input(s): HGBA1C in the last 72 hours. CBG: No results for input(s): GLUCAP in the last 168 hours. Lipid Profile: No results for input(s): CHOL, HDL, LDLCALC, TRIG, CHOLHDL, LDLDIRECT in the last 72 hours. Thyroid Function Tests: No results for input(s): TSH, T4TOTAL, FREET4, T3FREE, THYROIDAB in the last 72 hours. Anemia Panel: No results for input(s): VITAMINB12, FOLATE, FERRITIN, TIBC, IRON, RETICCTPCT in the last 72 hours. Sepsis Labs: No results for input(s): PROCALCITON, LATICACIDVEN in the last 168 hours.  No results found for this or any previous visit (from the past 240 hour(s)).  Radiology Studies: No results found.  Scheduled Meds: . amLODipine  5 mg Oral Daily  . aspirin  325 mg Oral Daily  . feeding supplement (ENSURE  ENLIVE)  237 mL Oral TID BM  . folic acid  1 mg Oral Daily  . heparin  5,000 Units Subcutaneous Q8H  . multivitamin with minerals  1 tablet Oral Daily  . thiamine  100 mg Oral Daily   Continuous Infusions:   LOS: 20 days   Time spent: 15 minutes  Erick Blinks, MD [facility] Pager [phone]  If 7PM-7AM, please contact night-coverage [email or web address] Password [email or web address] 04/06/2018, 3:18 PM

## 2018-04-06 NOTE — Progress Notes (Signed)
  Speech Language Pathology Treatment: Cognitive-Linquistic  Patient Details Name: Adam Koch MRN: [id number] DOB: 01/19/1956 Today's Date: 04/06/2018 Time: 4098-1191 SLP Time Calculation (min) (ACUTE ONLY): 24 min  Assessment / Plan / Recommendation Clinical Impression  Pt seen for cognitive linguistic therapy due to prolonged length of stay for guardianship. 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 Orientation and awareness is improved when offered binary choices (oriented to hospital in [place] due to a stroke). He was able to name common household objects and animals with 90% acc with allowance for occasional paraphasic/apraxic errors. He was shown [employer] app with visualizations for lip placement (as Pt hesitant to make eye contact with SLP) which did improved delayed productions of automatic sequences. SLP provided fill in the blank common sequences using [employer] football teams and Pt able to complete phrase with 75% acc when provided mod cues. SLP will continue to follow during acute setting.    HPI HPI: Adam Koch a 62 y.o.malewith medical history significant of alcohol abuse, tobacco use, hypertension who is coming to the emergency department due to being found by EMS and RDP wandering on the streets confused. He is unable to provide further history. A friend of the patient talked to EMS and stated that he drinks regularly. He smokes daily an unknown amount of cigarettes. He is only oriented to name and does not follow most simple commands. Severely motion degraded exam. 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 Mild swelling but no evidence of hemorrhage on these limited images. Question small region of acute infarction in the inferior cerebellum on the left as well. Pt failed RN swallow screen and RN requested BSE.       SLP Plan  Continue with current plan of care       Recommendations                   Follow up Recommendations: Skilled Nursing facility;24 hour supervision/assistance;Home health SLP SLP Visit Diagnosis: Cognitive communication deficit (R41.841) Attention and concentration deficit following: Cerebral infarction Plan: Continue with current plan of care       Thank you,  Adam Koch, CCC-SLP [phone]                 PORTER,DABNEY 04/06/2018, 2:46 PM

## 2018-04-07 NOTE — Progress Notes (Signed)
PROGRESS NOTE    Adam Koch  JWJ:[id number] DOB: 02/21/1956 DOA: 03/17/2018 PCP: Patient, No Pcp Per   Brief Narrative:   62 year old male with a history of alcohol abuse, tobacco use, hypertension presented with altered mental status. Patient is unable to provide any history secondary to his encephalopathy. Apparently, the patient was agitated and confused on the afternoon of 03/17/2018. According to his girlfriend, the patient walked outside into the backyard in his underwear and began pulling out flowers. Because of his agitation, she called the police who arrived and recommended EMS to bring the patient to the hospital. According to the patient's girlfriend, the patient was "acting drunk"on 03/15/2018.She states that she last saw him normal on 03/14/2018. She states that he frequently goes out to drink alcohol after he gets paid, although she cannot elaborate how much and how often and the patient drinks. However, she states that he has drank alcohol regularly for nearly 20 years. The patient continues to smoke tobacco although the amount is unclear. The been no reports of chest pain, shortness breath, vomiting, diarrhea, abdominal pain, chest pain. In the emergency department, the patient was afebrile hemodynamically stable saturating 100% on room air.   Assessment/Plan:  Updated 04/07/18  Acute metabolic encephalopathy -Multifactorial including Wernicke's encephalopathy, stroke, and possibly hypertensive encephalopathyand THC -04/07/18:  Pt appears to be at baseline, he is currently alert and oriented x3 -Serum B12--589 -TSH--1.094 -Urinalysis--neg for pyuria -Folic acid--11.5 -RPR--neg -Ammonia--21 -UDS--+THC -Thiamine 100mg  daily  Acute right MCA infarct -Neurology Consultappreciated-->30 day event monitor recommended.  -PT/OT evaluation-->SNF -Speech therapy eval-->regular diet with thin liquids -CT brain--Moderate area of edema/acute to subacute infarct in the  right parietal lobe without evidence for hemorrhage or midline shift -MRI brain--acute infarctR-MCA territorywith few other smaller foci in the same territory;questionable infarct in the inferior cerebellum on the left -MRA brain--motion degraded, no hemodynamically significant stenosis -Carotid Duplex--negative for hemodynamically significant stenosis -CTA H&N--no large vessel occlusion, stable large right MCA infarct, mild interval increase in edema with mild mass-effect around area of infarct. Petechial hemorrhage of right parietal lobe. Distal right MCA parietal branch occlusion. -Echo--EF 55-60%, no WMA, no PFO noted -LDL--68 -HbA1C--5.4 -Antiplatelet--ASA 325 mg -9/13--case discussed by Dr. Arbutus Leas with neurology, Dr. Lurene Shadow vasogenic edema to gradually improve; No need for mannitol  Essential hypertension -currently on amlodipine.blood pressure has been stable  Severe Protein malnutrition -continue Ensure  Disposition Plan:SNFwhen bed available, DSS involved.  Family Communication:cousin, Delphine Jonesupdatedon phone 04/06/18  Consultants:neurology  Code Status: FULL   DVT Prophylaxis: [place] Heparin    Procedures: As Listed in Progress Note Above  Antibiotics: None  Subjective: No chest pain or shortness of breath  Objective: Vitals:   04/06/18 2025 04/06/18 2105 04/07/18 0515 04/07/18 1516  BP: 139/80  108/71 125/72  Pulse: 85  75 84  Resp: 18  18 18   Temp: 98.5 F (36.9 C)  98.3 F (36.8 C) 98.5 F (36.9 C)  TempSrc: Oral   Oral  SpO2: 100% 100% 100% 100%  Weight:      Height:        Intake/Output Summary (Last 24 hours) at 04/07/2018 1645 Last data filed at 04/07/2018 0740 Gross per 24 hour  Intake 720 ml  Output -  Net 720 ml   Filed Weights   03/17/18 1629 03/18/18 0028 03/24/18 1113  Weight: 72 kg 59.7 kg 62.8 kg    Examination:  General exam: Alert, awake, oriented x 3 Respiratory system: Clear to  auscultation. Respiratory effort normal. Cardiovascular system:RRR.  No murmurs, rubs, gallops. Gastrointestinal system: Abdomen is nondistended, soft and nontender. No organomegaly or masses felt. Normal bowel sounds heard.  Data Reviewed: I have personally reviewed following labs and imaging studies  CBC: No results for input(s): WBC, NEUTROABS, HGB, HCT, MCV, PLT in the last 168 hours. Basic Metabolic Panel: No results for input(s): NA, K, CL, CO2, GLUCOSE, BUN, CREATININE, CALCIUM, MG, PHOS in the last 168 hours. GFR: Estimated Creatinine Clearance: 83.1 mL/min (by C-G formula based on SCr of 0.84 mg/dL). Liver Function Tests: No results for input(s): AST, ALT, ALKPHOS, BILITOT, PROT, ALBUMIN in the last 168 hours. No results for input(s): LIPASE, AMYLASE in the last 168 hours. No results for input(s): AMMONIA in the last 168 hours. Coagulation Profile: No results for input(s): INR, PROTIME in the last 168 hours. Cardiac Enzymes: No results for input(s): CKTOTAL, CKMB, CKMBINDEX, TROPONINI in the last 168 hours. BNP (last 3 results) No results for input(s): PROBNP in the last 8760 hours. HbA1C: No results for input(s): HGBA1C in the last 72 hours. CBG: No results for input(s): GLUCAP in the last 168 hours. Lipid Profile: No results for input(s): CHOL, HDL, LDLCALC, TRIG, CHOLHDL, LDLDIRECT in the last 72 hours. Thyroid Function Tests: No results for input(s): TSH, T4TOTAL, FREET4, T3FREE, THYROIDAB in the last 72 hours. Anemia Panel: No results for input(s): VITAMINB12, FOLATE, FERRITIN, TIBC, IRON, RETICCTPCT in the last 72 hours. Sepsis Labs: No results for input(s): PROCALCITON, LATICACIDVEN in the last 168 hours.  No results found for this or any previous visit (from the past 240 hour(s)).  Radiology Studies: No results found.  Scheduled Meds: . amLODipine  5 mg Oral Daily  . aspirin  325 mg Oral Daily  . feeding supplement (ENSURE ENLIVE)  237 mL Oral TID BM  .  folic acid  1 mg Oral Daily  . heparin  5,000 Units Subcutaneous Q8H  . multivitamin with minerals  1 tablet Oral Daily  . thiamine  100 mg Oral Daily   Continuous Infusions:   LOS: 21 days   Time spent: 15 minutes  Erick Blinks, MD [facility] Pager [phone]  If 7PM-7AM, please contact night-coverage [email or web address] Password [email or web address] 04/07/2018, 4:45 PM

## 2018-04-07 NOTE — Care Management Note (Signed)
Case Management Note  Patient Details  Name: Adam Koch MRN: [id number] Date of Birth: May 29, 1956  If discussed at Long Length of Stay Meetings, dates discussed:  04/07/2018  Additional Comments:  Malcolm Metro, RN 04/07/2018, 10:05 AM

## 2018-04-07 NOTE — Clinical Social Work Note (Signed)
Patient's continued status of Medicaid/Disability application not yet approved continues to be a barrier to facilities accepting patient.   Hiram Gash at [facility] stated that she has started the application, however it can take up to 90 days.     Ancelmo Hunt, [name], LCSW

## 2018-04-08 NOTE — Progress Notes (Signed)
PROGRESS NOTE    Adam Koch  ONG:[id number] DOB: [birth year] DOA: 03/17/2018 PCP: Patient, No Pcp Per   Brief Narrative:   62 year old male with a history of alcohol abuse, tobacco use, hypertension presented with altered mental status. Patient is unable to provide any history secondary to his encephalopathy. Apparently, the patient was agitated and confused on the afternoon of 03/17/2018. According to his girlfriend, the patient walked outside into the backyard in his underwear and began pulling out flowers. Because of his agitation, she called the police who arrived and recommended EMS to bring the patient to the hospital. According to the patient's girlfriend, the patient was "acting drunk"on 03/15/2018.She states that she last saw him normal on 03/14/2018. She states that he frequently goes out to drink alcohol after he gets paid, although she cannot elaborate how much and how often and the patient drinks. However, she states that he has drank alcohol regularly for nearly 20 years. The patient continues to smoke tobacco although the amount is unclear. The been no reports of chest pain, shortness breath, vomiting, diarrhea, abdominal pain, chest pain. In the emergency department, the patient was afebrile hemodynamically stable saturating 100% on room air.   Assessment/Plan:  Updated 04/08/18  Acute metabolic encephalopathy -Multifactorial including Wernicke's encephalopathy, stroke, and possibly hypertensive encephalopathyand THC -04/08/18: Patient is pleasant, oriented, appears to be back to baseline -Serum B12--589 -TSH--1.094 -Urinalysis--neg for pyuria -Folic acid--11.5 -RPR--neg -Ammonia--21 -UDS--+THC -Thiamine 100mg  daily  Acute right MCA infarct -Neurology Consultappreciated-->30 day event monitor recommended.  -PT/OT evaluation-->SNF -Speech therapy eval-->regular diet with thin liquids -CT brain--Moderate area of edema/acute to subacute infarct in the right  parietal lobe without evidence for hemorrhage or midline shift -MRI brain--acute infarctR-MCA territorywith few other smaller foci in the same territory;questionable infarct in the inferior cerebellum on the left -MRA brain--motion degraded, no hemodynamically significant stenosis -Carotid Duplex--negative for hemodynamically significant stenosis -CTA H&N--no large vessel occlusion, stable large right MCA infarct, mild interval increase in edema with mild mass-effect around area of infarct. Petechial hemorrhage of right parietal lobe. Distal right MCA parietal branch occlusion. -Echo--EF 55-60%, no WMA, no PFO noted -LDL--68 -HbA1C--5.4 -Antiplatelet--ASA 325 mg -9/13--case discussed by Dr. Arbutus Leas with neurology, Dr. Lurene Shadow vasogenic edema to gradually improve; No need for mannitol  Essential hypertension -currently on amlodipine. Blood pressure remained stable  Severe Protein malnutrition -continue Ensure  Disposition Plan:SNFwhen bed available, DSS involved.  Family Communication:cousin, Delphine Jonesupdatedon phone 04/06/18  Consultants:neurology  Code Status: FULL   DVT Prophylaxis: [place] Heparin    Procedures: As Listed in Progress Note Above  Antibiotics: None  Subjective: Denies any pain.  No shortness of breath.  Objective: Vitals:   04/07/18 2040 04/07/18 2244 04/08/18 0622 04/08/18 1450  BP: 132/82  134/83 (!) 147/90  Pulse: 79  77 76  Resp: 18   17  Temp: 98.1 F (36.7 C)  98.3 F (36.8 C) 97.6 F (36.4 C)  TempSrc:   Oral Oral  SpO2: 99% 98% 100% (!) 10%  Weight:      Height:        Intake/Output Summary (Last 24 hours) at 04/08/2018 1842 Last data filed at 04/08/2018 1837 Gross per 24 hour  Intake 840 ml  Output -  Net 840 ml   Filed Weights   03/17/18 1629 03/18/18 0028 03/24/18 1113  Weight: 72 kg 59.7 kg 62.8 kg    Examination:  General exam: Alert, awake, oriented x 3 Respiratory system: Clear  to auscultation. Respiratory effort normal. Cardiovascular system:RRR. No  murmurs, rubs, gallops. Gastrointestinal system: Abdomen is nondistended, soft and nontender. No organomegaly or masses felt. Normal bowel sounds heard.   Data Reviewed: I have personally reviewed following labs and imaging studies  CBC: No results for input(s): WBC, NEUTROABS, HGB, HCT, MCV, PLT in the last 168 hours. Basic Metabolic Panel: No results for input(s): NA, K, CL, CO2, GLUCOSE, BUN, CREATININE, CALCIUM, MG, PHOS in the last 168 hours. GFR: Estimated Creatinine Clearance: 83.1 mL/min (by C-G formula based on SCr of 0.84 mg/dL). Liver Function Tests: No results for input(s): AST, ALT, ALKPHOS, BILITOT, PROT, ALBUMIN in the last 168 hours. No results for input(s): LIPASE, AMYLASE in the last 168 hours. No results for input(s): AMMONIA in the last 168 hours. Coagulation Profile: No results for input(s): INR, PROTIME in the last 168 hours. Cardiac Enzymes: No results for input(s): CKTOTAL, CKMB, CKMBINDEX, TROPONINI in the last 168 hours. BNP (last 3 results) No results for input(s): PROBNP in the last 8760 hours. HbA1C: No results for input(s): HGBA1C in the last 72 hours. CBG: No results for input(s): GLUCAP in the last 168 hours. Lipid Profile: No results for input(s): CHOL, HDL, LDLCALC, TRIG, CHOLHDL, LDLDIRECT in the last 72 hours. Thyroid Function Tests: No results for input(s): TSH, T4TOTAL, FREET4, T3FREE, THYROIDAB in the last 72 hours. Anemia Panel: No results for input(s): VITAMINB12, FOLATE, FERRITIN, TIBC, IRON, RETICCTPCT in the last 72 hours. Sepsis Labs: No results for input(s): PROCALCITON, LATICACIDVEN in the last 168 hours.  No results found for this or any previous visit (from the past 240 hour(s)).  Radiology Studies: No results found.  Scheduled Meds: . amLODipine  5 mg Oral Daily  . aspirin  325 mg Oral Daily  . feeding supplement (ENSURE ENLIVE)  237 mL Oral TID BM  .  folic acid  1 mg Oral Daily  . heparin  5,000 Units Subcutaneous Q8H  . multivitamin with minerals  1 tablet Oral Daily  . thiamine  100 mg Oral Daily   Continuous Infusions:   LOS: 22 days   Time spent: 15 minutes  Erick Blinks, MD [facility] Pager [phone]  If 7PM-7AM, please contact night-coverage [email or web address] Password [facility] 04/08/2018, 6:42 PM

## 2018-04-09 MED ORDER — ENSURE ENLIVE PO LIQD
237.0000 mL | Freq: Two times a day (BID) | ORAL | Status: DC
  Administered 2018-04-10 – 2018-04-21 (×19): 237 mL via ORAL

## 2018-04-09 MED ORDER — METOPROLOL TARTRATE 25 MG PO TABS
12.5000 mg | ORAL_TABLET | Freq: Two times a day (BID) | ORAL | Status: DC
  Administered 2018-04-09 – 2018-05-03 (×49): 12.5 mg via ORAL
  Filled 2018-04-09 (×50): qty 1

## 2018-04-09 NOTE — Progress Notes (Signed)
Nutrition Follow-up  DOCUMENTATION CODES:  Severe malnutrition in context of social or environmental circumstances  INTERVENTION:  ReOffered patient some new comfort foods. Will place on tray.   Decrease Ensure Enlive to po BID, each supplement provides 350 kcal and 20 grams of protein  NUTRITION DIAGNOSIS:  Severe Malnutrition related to social / environmental circumstances as evidenced by severe muscle/fat depletion  Ongoing, resolving  GOAL:  Patient will meet greater than or equal to 90% of their needs  MET  MONITOR:  PO intake, Supplement acceptance, Weight trends, I & O's  ASSESSMENT:  62 year old male who presented to the ED on 9/10 after being found wandering in the streets confused. PMH significant for EtOH abuse, tobacco use, hypertension. Pt found to have acute CVA.  Following up with patient again due to prolonged length of stay.   Interval history from past week is unremarkable. Patient remains quite stable. Disposition still ongoing as CSW seeking guardianship for patient. DSS also working on case. Today CSW reached out to [facility] for potential DTP placement.  Per Re-review of meal documentation. Patient has eaten 100% of every documented meal since seen 1 week ago. All together, he has eaten 100% of every single documented meal since 9/13. In addition, he has Ensure ENlive ordered TID and he has been consuming the the vast majority of these.   Patient alert sitting on end of bed. He is using more complete sentences today, as opposed to last week when answered nearly all questions with either a "yes sir" or "no sir".   He again reports a good appetite. Still has no complaints. No n/v/c/d.   Patient had not been weighed x2 weeks. RN kindly brought RD standing scale to weigh patient. He was 144 lbs today. This is a gain of >12 lbs since admitted. Wt gain was very desired. His BMI is now no longer classified as underweight.   He was fine with reducing  Ensure to BID. He says he likes the icecream RD added last week. RD asked about other items. He notes he likes fruit. Will add to trays.    He has a good appetite. He has no complaints with the foods. He feels good. No n/v/c/d. He likes the Ensure and wants to continue consuming them TID.   Labs:  a1C: 5.4 Meds: Ensure Enlive TID- Consuming most of them. MVI with min, Thiamin, Folate  Diet Order:   Diet Order            Diet regular Room service appropriate? Yes; Fluid consistency: Thin  Diet effective now             EDUCATION NEEDS:  No education needs have been identified at this time  Skin:  Skin Assessment: Reviewed RN Assessment  Last BM:  10/1  Height:  Ht Readings from Last 1 Encounters:  03/18/18 _0  (1.854 m)   Weight:  Wt Readings from Last 1 Encounters:  04/09/18 65.3 kg   Wt Readings from Last 10 Encounters:  04/09/18 65.3 kg  05/04/12 72.6 kg   Ideal Body Weight:  83.64 kg  BMI:  Body mass index is 19 kg/m.  Estimated Nutritional Needs:  Kcal:  1950-2150 (30-33 kcal/kg bw) Protein:  80-92g Pro (1.2-1.4g/kg bw) Fluid:  >1.95 L (30 ml/kg bw)  Burtis Junes RD, LDN, CNSC Clinical Nutrition Available Tues-Sat via Pager: [phone] 04/09/2018 2:57 PM

## 2018-04-09 NOTE — Care Management Note (Signed)
Case Management Note  Patient Details  Name: Marquinn Meschke MRN: [id number] Date of Birth: August 09, 1955   If discussed at Long Length of Stay Meetings, dates discussed:  04/09/18  Additional Comments:  Per AD, pt placed on difficult to place list.   Malcolm Metro, RN 04/09/2018, 12:36 PM

## 2018-04-09 NOTE — Progress Notes (Addendum)
PROGRESS NOTE    Adam Koch  ZOX:[id number] DOB: [birth year] DOA: 03/17/2018 PCP: Patient, No Pcp Per   Brief Narrative:   62 year old male with a history of alcohol abuse, tobacco use, hypertension presented with altered mental status. Patient is unable to provide any history secondary to his encephalopathy. Apparently, the patient was agitated and confused on the afternoon of 03/17/2018. According to his girlfriend, the patient walked outside into the backyard in his underwear and began pulling out flowers. Because of his agitation, she called the police who arrived and recommended EMS to bring the patient to the hospital. According to the patient's girlfriend, the patient was "acting drunk"on 03/15/2018.She states that she last saw him normal on 03/14/2018. She states that he frequently goes out to drink alcohol after he gets paid, although she cannot elaborate how much and how often and the patient drinks. However, she states that he has drank alcohol regularly for nearly 20 years. The patient continues to smoke tobacco although the amount is unclear. The been no reports of chest pain, shortness breath, vomiting, diarrhea, abdominal pain, chest pain. In the emergency department, the patient was afebrile hemodynamically stable saturating 100% on room air.   Assessment/Plan:  Updated 04/09/18  Acute metabolic encephalopathy -Multifactorial including Wernicke's encephalopathy, stroke, and possibly hypertensive encephalopathyand THC -04/08/18: Patient is pleasant, oriented, appears to be back to baseline -Serum B12--589 -TSH--1.094 -Urinalysis--neg for pyuria -Folic acid--11.5 -RPR--neg -Ammonia--21 -UDS--+THC -Thiamine 100mg  daily  Acute right MCA infarct -Neurology Consultappreciated-->30 day event monitor recommended.  -PT/OT evaluation-->SNF which is being arranged by CSW.  -Speech therapy eval-->regular diet with thin liquids and patient has been tolerating this well.    -CT brain--Moderate area of edema/acute to subacute infarct in the right parietal lobe without evidence for hemorrhage or midline shift -MRI brain--acute infarctR-MCA territorywith few other smaller foci in the same territory;questionable infarct in the inferior cerebellum on the left -MRA brain--motion degraded, no hemodynamically significant stenosis -Carotid Duplex--negative for hemodynamically significant stenosis -CTA H&N--no large vessel occlusion, stable large right MCA infarct, mild interval increase in edema with mild mass-effect around area of infarct. Petechial hemorrhage of right parietal lobe. Distal right MCA parietal branch occlusion. -Echo--EF 55-60%, no WMA, no PFO noted -LDL--68 -HbA1C--5.4 -Antiplatelet--ASA 325 mg -9/13--case discussed by Dr. Arbutus Leas with neurology, Dr. Lurene Shadow vasogenic edema to gradually improve; No need for mannitol  Essential hypertension -currently on amlodipine. Added metoprolol 12.5 mg BID.   Severe Protein malnutrition -continue Ensure  Disposition Plan:SNFwhen bed available, DSS involved.  Family Communication:cousin, Delphine Jonesupdatedon phone 04/06/18  Consultants:neurology  Code Status: FULL   DVT Prophylaxis: [place] Heparin    Procedures: As Listed in Progress Note Above  Antibiotics: None  Subjective: Pt denies any specific complaints.    Objective: Vitals:   04/08/18 1450 04/08/18 2150 04/09/18 0546 04/09/18 0859  BP: (!) 147/90 (!) 120/56 (!) 144/91   Pulse: 76 76 70   Resp: 17 18 16    Temp: 97.6 F (36.4 C) 98.3 F (36.8 C) 97.9 F (36.6 C)   TempSrc: Oral Oral Oral   SpO2: (!) 10% 100% 100% 99%  Weight:      Height:        Intake/Output Summary (Last 24 hours) at 04/09/2018 0938 Last data filed at 04/08/2018 1837 Gross per 24 hour  Intake 840 ml  Output -  Net 840 ml   Filed Weights   03/17/18 1629 03/18/18 0028 03/24/18 1113  Weight: 72 kg 59.7 kg 62.8 kg     Examination:  General exam: thin male sitting up in bed, NAD. Cooperative.  Alert, awake, oriented x 3 Respiratory system: Clear to auscultation. Respiratory effort normal. Cardiovascular system: normal s1,s2. No murmurs, rubs, gallops. Gastrointestinal system: Abdomen is nondistended, soft and nontender. No organomegaly or masses felt. Normal bowel sounds heard. Ext: no CCE.   Data Reviewed: I have personally reviewed following labs and imaging studies  CBC: No results for input(s): WBC, NEUTROABS, HGB, HCT, MCV, PLT in the last 168 hours. Basic Metabolic Panel: No results for input(s): NA, K, CL, CO2, GLUCOSE, BUN, CREATININE, CALCIUM, MG, PHOS in the last 168 hours. GFR: Estimated Creatinine Clearance: 83.1 mL/min (by C-G formula based on SCr of 0.84 mg/dL). Liver Function Tests: No results for input(s): AST, ALT, ALKPHOS, BILITOT, PROT, ALBUMIN in the last 168 hours. No results for input(s): LIPASE, AMYLASE in the last 168 hours. No results for input(s): AMMONIA in the last 168 hours. Coagulation Profile: No results for input(s): INR, PROTIME in the last 168 hours. Cardiac Enzymes: No results for input(s): CKTOTAL, CKMB, CKMBINDEX, TROPONINI in the last 168 hours. BNP (last 3 results) No results for input(s): PROBNP in the last 8760 hours. HbA1C: No results for input(s): HGBA1C in the last 72 hours. CBG: No results for input(s): GLUCAP in the last 168 hours. Lipid Profile: No results for input(s): CHOL, HDL, LDLCALC, TRIG, CHOLHDL, LDLDIRECT in the last 72 hours. Thyroid Function Tests: No results for input(s): TSH, T4TOTAL, FREET4, T3FREE, THYROIDAB in the last 72 hours. Anemia Panel: No results for input(s): VITAMINB12, FOLATE, FERRITIN, TIBC, IRON, RETICCTPCT in the last 72 hours. Sepsis Labs: No results for input(s): PROCALCITON, LATICACIDVEN in the last 168 hours.  No results found for this or any previous visit (from the past 240 hour(s)).  Radiology  Studies: No results found.  Scheduled Meds: . amLODipine  5 mg Oral Daily  . aspirin  325 mg Oral Daily  . feeding supplement (ENSURE ENLIVE)  237 mL Oral TID BM  . folic acid  1 mg Oral Daily  . heparin  5,000 Units Subcutaneous Q8H  . multivitamin with minerals  1 tablet Oral Daily  . thiamine  100 mg Oral Daily   Continuous Infusions:   LOS: 23 days   Time spent: 15 minutes  Standley Dakins, MD [facility] Pager [phone]  If 7PM-7AM, please contact night-coverage [email or web address] Password [email or web address] 04/09/2018, 9:38 AM

## 2018-04-09 NOTE — Clinical Social Work Note (Signed)
LCSW spoke with Lynnea Ferrier at [facility] regarding patient being a difficult to place patient. Lynnea Ferrier stated that she would have Toniann Fail review patient. LCSW discussed that patient's guardian is [facility], Hiram Gash, and that patient would likely step down to a lower level of care once his disability and Medicaid came through.     Niaomi Cartaya, [name], LCSW

## 2018-04-09 NOTE — Progress Notes (Signed)
  Speech Language Pathology Treatment: Cognitive-Linquistic  Patient Details Name: Adam Koch MRN: [id number] DOB: 04/30/1956 Today's Date: 04/09/2018 Time: 4098-1191 SLP Time Calculation (min) (ACUTE ONLY): 23 min  Assessment / Plan / Recommendation Clinical Impression  Pt continues to make good progress in cognition and expressive language skills. He was able to follow all directions with min assist. He named common objects with 85% acc with allowance for paraphasic errors. He completed multisyllabic word repetition tasks with 70% acc with mod SLP support. SLP will see intermittently during his prolonged stay.    HPI HPI: [name] a 62 y.o.malewith medical history significant of alcohol abuse, tobacco use, hypertension who is coming to the emergency department due to being found by EMS and RDP wandering on the streets confused. He is unable to provide further history. A friend of the patient talked to EMS and stated that he drinks regularly. He smokes daily an unknown amount of cigarettes. He is only oriented to name and does not follow most simple commands. Severely motion degraded exam. 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 Mild swelling but no evidence of hemorrhage on these limited images. Question small region of acute infarction in the inferior cerebellum on the left as well. Pt failed RN swallow screen and RN requested BSE.      SLP Plan  Continue with current plan of care       Recommendations                   Follow up Recommendations: Outpatient SLP SLP Visit Diagnosis: Cognitive communication deficit (R41.841) Attention and concentration deficit following: Cerebral infarction Plan: Continue with current plan of care       Thank you,  [name], CCC-SLP [phone]                 PORTER,DABNEY 04/09/2018, 5:53 PM

## 2018-04-10 NOTE — Progress Notes (Signed)
PROGRESS NOTE    Adam Koch  ZOX:[id number] DOB: 1956/05/19 DOA: 03/17/2018 PCP: Patient, No Pcp Per   Brief Narrative:   62 year old male with a history of alcohol abuse, tobacco use, hypertension presented with altered mental status. Patient is unable to provide any history secondary to his encephalopathy. Apparently, the patient was agitated and confused on the afternoon of 03/17/2018. According to his girlfriend, the patient walked outside into the backyard in his underwear and began pulling out flowers. Because of his agitation, she called the police who arrived and recommended EMS to bring the patient to the hospital. According to the patient's girlfriend, the patient was "acting drunk"on 03/15/2018.She states that she last saw him normal on 03/14/2018. She states that he frequently goes out to drink alcohol after he gets paid, although she cannot elaborate how much and how often and the patient drinks. However, she states that he has drank alcohol regularly for nearly 20 years. The patient continues to smoke tobacco although the amount is unclear. The been no reports of chest pain, shortness breath, vomiting, diarrhea, abdominal pain, chest pain. In the emergency department, the patient was afebrile hemodynamically stable saturating 100% on room air.   Assessment/Plan:  Updated 04/10/18  Acute metabolic encephalopathy -Multifactorial including Wernicke's encephalopathy, stroke, and possibly hypertensive encephalopathyand THC -04/08/18: Patient is pleasant, oriented, appears to be back to baseline.  He is confused at times but there is significant improvement in cognition noted.   -Serum B12--589 -TSH--1.094 -Urinalysis--neg for pyuria -Folic acid--11.5 -RPR--neg -Ammonia--21 -UDS--+THC -Thiamine 100mg  daily  Acute right MCA infarct -Neurology Consultappreciated-->30 day event monitor recommended.  -PT/OT evaluation-->SNF which is being arranged by CSW.  -Speech  therapy eval-->regular diet with thin liquids and patient has been tolerating this well.  -CT brain--Moderate area of edema/acute to subacute infarct in the right parietal lobe without evidence for hemorrhage or midline shift -MRI brain--acute infarctR-MCA territorywith few other smaller foci in the same territory;questionable infarct in the inferior cerebellum on the left -MRA brain--motion degraded, no hemodynamically significant stenosis -Carotid Duplex--negative for hemodynamically significant stenosis -CTA H&N--no large vessel occlusion, stable large right MCA infarct, mild interval increase in edema with mild mass-effect around area of infarct. Petechial hemorrhage of right parietal lobe. Distal right MCA parietal branch occlusion. -Echo--EF 55-60%, no WMA, no PFO noted -LDL--68 -HbA1C--5.4 -Antiplatelet--ASA 325 mg -9/13--case discussed by Dr. Arbutus Leas with neurology, Dr. Lurene Shadow vasogenic edema to gradually improve; No need for mannitol  Essential hypertension -currently on amlodipine. Added metoprolol 12.5 mg BID.   Severe Protein malnutrition -continue Ensure  Disposition Plan:SNFwhen bed available, DSS involved.  Family Communication:cousin, Delphine Jonesupdatedon phone 04/06/18  Consultants:neurology  Code Status: FULL   DVT Prophylaxis: [place] Heparin    Procedures: As Listed in Progress Note Above  Antibiotics: None  Subjective: Pt denies any specific complaints.  He remains confused about place and time, year, etc but able to easily be re-oriented.  Objective: Vitals:   04/09/18 1452 04/09/18 1510 04/09/18 2121 04/10/18 0551  BP:  139/79 136/78 (!) 160/86  Pulse:  67 64 66  Resp:  16    Temp:   98.3 F (36.8 C) 98.3 F (36.8 C)  TempSrc:   Oral Oral  SpO2:  100% 98% 98%  Weight: 65.3 kg     Height:        Intake/Output Summary (Last 24 hours) at 04/10/2018 0819 Last data filed at 04/09/2018 1755 Gross per 24 hour   Intake 1200 ml  Output -  Net 1200 ml  Filed Weights   03/18/18 0028 03/24/18 1113 04/09/18 1452  Weight: 59.7 kg 62.8 kg 65.3 kg    Examination:  General exam: thin male sitting up in bed, NAD. Cooperative.  Alert, awake, oriented x 3 Respiratory system: Clear to auscultation. Respiratory effort normal. Cardiovascular system: normal s1,s2. No murmurs, rubs, gallops. Gastrointestinal system: Abdomen is nondistended, soft and nontender. No organomegaly or masses felt. Normal bowel sounds heard. Ext: no CCE.   Data Reviewed: I have personally reviewed following labs and imaging studies  CBC: No results for input(s): WBC, NEUTROABS, HGB, HCT, MCV, PLT in the last 168 hours. Basic Metabolic Panel: No results for input(s): NA, K, CL, CO2, GLUCOSE, BUN, CREATININE, CALCIUM, MG, PHOS in the last 168 hours. GFR: Estimated Creatinine Clearance: 86.4 mL/min (by C-G formula based on SCr of 0.84 mg/dL). Liver Function Tests: No results for input(s): AST, ALT, ALKPHOS, BILITOT, PROT, ALBUMIN in the last 168 hours. No results for input(s): LIPASE, AMYLASE in the last 168 hours. No results for input(s): AMMONIA in the last 168 hours. Coagulation Profile: No results for input(s): INR, PROTIME in the last 168 hours. Cardiac Enzymes: No results for input(s): CKTOTAL, CKMB, CKMBINDEX, TROPONINI in the last 168 hours. BNP (last 3 results) No results for input(s): PROBNP in the last 8760 hours. HbA1C: No results for input(s): HGBA1C in the last 72 hours. CBG: No results for input(s): GLUCAP in the last 168 hours. Lipid Profile: No results for input(s): CHOL, HDL, LDLCALC, TRIG, CHOLHDL, LDLDIRECT in the last 72 hours. Thyroid Function Tests: No results for input(s): TSH, T4TOTAL, FREET4, T3FREE, THYROIDAB in the last 72 hours. Anemia Panel: No results for input(s): VITAMINB12, FOLATE, FERRITIN, TIBC, IRON, RETICCTPCT in the last 72 hours. Sepsis Labs: No results for input(s): PROCALCITON,  LATICACIDVEN in the last 168 hours.  No results found for this or any previous visit (from the past 240 hour(s)).  Radiology Studies: No results found.  Scheduled Meds: . amLODipine  5 mg Oral Daily  . aspirin  325 mg Oral Daily  . feeding supplement (ENSURE ENLIVE)  237 mL Oral BID BM  . folic acid  1 mg Oral Daily  . heparin  5,000 Units Subcutaneous Q8H  . metoprolol tartrate  12.5 mg Oral BID  . multivitamin with minerals  1 tablet Oral Daily  . thiamine  100 mg Oral Daily   Continuous Infusions:   LOS: 24 days   Time spent: 15 minutes  Standley Dakins, MD [facility] Pager [phone]  If 7PM-7AM, please contact night-coverage [email or web address] Password [email or web address] 04/10/2018, 8:19 AM

## 2018-04-11 NOTE — Progress Notes (Signed)
PROGRESS NOTE    Adam Koch  ZOX:[id number] DOB: [birth year] DOA: 03/17/2018 PCP: Patient, No Pcp Per   Brief Narrative:   62 year old male with a history of alcohol abuse, tobacco use, hypertension presented with altered mental status. Patient is unable to provide any history secondary to his encephalopathy. Apparently, the patient was agitated and confused on the afternoon of 03/17/2018. According to his girlfriend, the patient walked outside into the backyard in his underwear and began pulling out flowers. Because of his agitation, she called the police who arrived and recommended EMS to bring the patient to the hospital. According to the patient's girlfriend, the patient was "acting drunk"on 03/15/2018.She states that she last saw him normal on 03/14/2018. She states that he frequently goes out to drink alcohol after he gets paid, although she cannot elaborate how much and how often and the patient drinks. However, she states that he has drank alcohol regularly for nearly 20 years. The patient continues to smoke tobacco although the amount is unclear. The been no reports of chest pain, shortness breath, vomiting, diarrhea, abdominal pain, chest pain. In the emergency department, the patient was afebrile hemodynamically stable saturating 100% on room air.   Assessment/Plan:  Acute metabolic encephalopathy -Multifactorial including Wernicke's encephalopathy, stroke, and possibly hypertensive encephalopathyand THC -04/08/18: Patient is pleasant, oriented, appears to be back to baseline.  He is confused at times but there is significant improvement in cognition noted.   -Serum B12--589 -TSH--1.094 -Urinalysis--neg for pyuria -Folic acid--11.5 -RPR--neg -Ammonia--21 -UDS--+THC -Thiamine 100mg  daily  Acute right MCA infarct -Neurology Consultappreciated-->30 day event monitor recommended.  -PT/OT evaluation-->SNF which is being arranged by CSW.  -Speech therapy eval-->regular  diet with thin liquids and patient has been tolerating this well.  -CT brain--Moderate area of edema/acute to subacute infarct in the right parietal lobe without evidence for hemorrhage or midline shift -MRI brain--acute infarctR-MCA territorywith few other smaller foci in the same territory;questionable infarct in the inferior cerebellum on the left -MRA brain--motion degraded, no hemodynamically significant stenosis -Carotid Duplex--negative for hemodynamically significant stenosis -CTA H&N--no large vessel occlusion, stable large right MCA infarct, mild interval increase in edema with mild mass-effect around area of infarct. Petechial hemorrhage of right parietal lobe. Distal right MCA parietal branch occlusion. -Echo--EF 55-60%, no WMA, no PFO noted -LDL--68 -HbA1C--5.4 -Antiplatelet--ASA 325 mg -9/13--case discussed by Dr. Arbutus Leas with neurology, Dr. Lurene Shadow vasogenic edema to gradually improve; No need for mannitol  Essential hypertension -currently on amlodipine. Added metoprolol 12.5 mg BID.  BP better controlled.   Severe Protein malnutrition -continue Ensure  Disposition Plan:SNFwhen bed available, DSS involved.  Family Communication:cousin, Delphine Jonesupdatedon phone 04/06/18  Consultants:neurology  Code Status: FULL   DVT Prophylaxis: [place] Heparin    Procedures: As Listed in Progress Note Above  Antibiotics: None  Subjective: Pt denies any specific complaints.  He remains confused about place and time, year.  Objective: Vitals:   04/10/18 0551 04/10/18 1428 04/10/18 2005 04/11/18 0433  BP: (!) 160/86 111/72 137/73 139/89  Pulse: 66 78 71 60  Resp:  19  18  Temp: 98.3 F (36.8 C)  98.4 F (36.9 C) 97.8 F (36.6 C)  TempSrc: Oral  Oral Oral  SpO2: 98% 100% 100% 100%  Weight:      Height:        Intake/Output Summary (Last 24 hours) at 04/11/2018 0840 Last data filed at 04/10/2018 1259 Gross per 24 hour  Intake  300 ml  Output -  Net 300 ml   [employer]  03/18/18 0028 03/24/18 1113 04/09/18 1452  Weight: 59.7 kg 62.8 kg 65.3 kg    Examination:  General exam: thin male sitting up in bed, NAD. Cooperative.  Alert, awake, oriented x 3 Respiratory system: Clear to auscultation. Respiratory effort normal. Cardiovascular system: normal s1,s2. No murmurs, rubs, gallops. Gastrointestinal system: Abdomen is nondistended, soft and nontender. No organomegaly or masses felt. Normal bowel sounds heard. Ext: no CCE.   Data Reviewed: I have personally reviewed following labs and imaging studies  CBC: No results for input(s): WBC, NEUTROABS, HGB, HCT, MCV, PLT in the last 168 hours. Basic Metabolic Panel: No results for input(s): NA, K, CL, CO2, GLUCOSE, BUN, CREATININE, CALCIUM, MG, PHOS in the last 168 hours. GFR: Estimated Creatinine Clearance: 85.3 mL/min (by C-G formula based on SCr of 0.84 mg/dL). Liver Function Tests: No results for input(s): AST, ALT, ALKPHOS, BILITOT, PROT, ALBUMIN in the last 168 hours. No results for input(s): LIPASE, AMYLASE in the last 168 hours. No results for input(s): AMMONIA in the last 168 hours. Coagulation Profile: No results for input(s): INR, PROTIME in the last 168 hours. Cardiac Enzymes: No results for input(s): CKTOTAL, CKMB, CKMBINDEX, TROPONINI in the last 168 hours. BNP (last 3 results) No results for input(s): PROBNP in the last 8760 hours. HbA1C: No results for input(s): HGBA1C in the last 72 hours. CBG: No results for input(s): GLUCAP in the last 168 hours. Lipid Profile: No results for input(s): CHOL, HDL, LDLCALC, TRIG, CHOLHDL, LDLDIRECT in the last 72 hours. Thyroid Function Tests: No results for input(s): TSH, T4TOTAL, FREET4, T3FREE, THYROIDAB in the last 72 hours. Anemia Panel: No results for input(s): VITAMINB12, FOLATE, FERRITIN, TIBC, IRON, RETICCTPCT in the last 72 hours. Sepsis Labs: No results for input(s): PROCALCITON,  LATICACIDVEN in the last 168 hours.  No results found for this or any previous visit (from the past 240 hour(s)).  Radiology Studies: No results found.  Scheduled Meds: . amLODipine  5 mg Oral Daily  . aspirin  325 mg Oral Daily  . feeding supplement (ENSURE ENLIVE)  237 mL Oral BID BM  . folic acid  1 mg Oral Daily  . heparin  5,000 Units Subcutaneous Q8H  . metoprolol tartrate  12.5 mg Oral BID  . multivitamin with minerals  1 tablet Oral Daily  . thiamine  100 mg Oral Daily   Continuous Infusions:   LOS: 25 days   Time spent: 15 minutes  Standley Dakins, MD [facility] Pager [phone]  If 7PM-7AM, please contact night-coverage [email or web address] Password [email or web address] 04/11/2018, 8:40 AM

## 2018-04-12 NOTE — Progress Notes (Signed)
PROGRESS NOTE    Adam Koch  JXB:[id number] DOB: 02/15/1956 DOA: 03/17/2018 PCP: Patient, No Pcp Per   Brief Narrative:   62 year old male with a history of alcohol abuse, tobacco use, hypertension presented with altered mental status. Patient is unable to provide any history secondary to his encephalopathy. Apparently, the patient was agitated and confused on the afternoon of 03/17/2018. According to his girlfriend, the patient walked outside into the backyard in his underwear and began pulling out flowers. Because of his agitation, she called the police who arrived and recommended EMS to bring the patient to the hospital. According to the patient's girlfriend, the patient was "acting drunk"on 03/15/2018.She states that she last saw him normal on 03/14/2018. She states that he frequently goes out to drink alcohol after he gets paid, although she cannot elaborate how much and how often and the patient drinks. However, she states that he has drank alcohol regularly for nearly 20 years. The patient continues to smoke tobacco although the amount is unclear. The been no reports of chest pain, shortness breath, vomiting, diarrhea, abdominal pain, chest pain. In the emergency department, the patient was afebrile hemodynamically stable saturating 100% on room air.   Assessment/Plan:  Acute metabolic encephalopathy -Multifactorial including Wernicke's encephalopathy, stroke, and possibly hypertensive encephalopathyand THC -04/08/18: Patient is pleasant, oriented, appears to be back to baseline.  He is confused at times but there is significant improvement in cognition noted.   -Serum B12--589 -TSH--1.094 -Urinalysis--neg for pyuria -Folic acid--11.5 -RPR--neg -Ammonia--21 -UDS--+THC -Thiamine 100mg  daily  Acute right MCA infarct -Neurology Consultappreciated-->30 day event monitor recommended.  -PT/OT evaluation-->SNF which is being arranged by CSW.  -Speech therapy eval-->regular  diet with thin liquids and patient has been tolerating this well.  -CT brain--Moderate area of edema/acute to subacute infarct in the right parietal lobe without evidence for hemorrhage or midline shift -MRI brain--acute infarctR-MCA territorywith few other smaller foci in the same territory;questionable infarct in the inferior cerebellum on the left -MRA brain--motion degraded, no hemodynamically significant stenosis -Carotid Duplex--negative for hemodynamically significant stenosis -CTA H&N--no large vessel occlusion, stable large right MCA infarct, mild interval increase in edema with mild mass-effect around area of infarct. Petechial hemorrhage of right parietal lobe. Distal right MCA parietal branch occlusion. -Echo--EF 55-60%, no WMA, no PFO noted -LDL--68 -HbA1C--5.4 -Antiplatelet--ASA 325 mg -9/13--case discussed by Dr. Arbutus Leas with neurology, Dr. Lurene Shadow vasogenic edema to gradually improve; No need for mannitol  Essential hypertension -currently on amlodipine. Added metoprolol 12.5 mg BID.  BP much better controlled.   Severe Protein malnutrition -continue Ensure  Disposition Plan:SNFwhen bed available, DSS involved.  Family Communication:cousin, Delphine Jonesupdatedon phone 04/06/18  Consultants:neurology  Code Status: FULL   DVT Prophylaxis: [place] Heparin    Procedures: As Listed in Progress Note Above  Antibiotics: None  Subjective: Pt denies any specific complaints.  He is in good spirits today.  [birth year] was [birth year].  He wants to discharge.   Objective: Vitals:   04/11/18 1315 04/11/18 2117 04/11/18 2123 04/12/18 0520  BP: (!) 149/93  132/87 128/73  Pulse: 66  73 72  Resp: 18  18 18   Temp: 98.5 F (36.9 C)  98.3 F (36.8 C) 98.5 F (36.9 C)  TempSrc:   Oral Oral  SpO2: 100% 98% 100% 100%  Weight:      Height:        Intake/Output Summary (Last 24 hours) at 04/12/2018 1303 Last data filed at 04/11/2018  1700 Gross per 24 hour  Intake 240 ml  Output -  Net 240 ml   Filed Weights   03/18/18 0028 03/24/18 1113 04/09/18 1452  Weight: 59.7 kg 62.8 kg 65.3 kg    Examination:  General exam: thin male sitting up in bed, NAD. Cooperative.  Alert, awake, oriented x 3 Respiratory system: Clear to auscultation. Respiratory effort normal. Cardiovascular system: normal s1,s2. No murmurs, rubs, gallops. Gastrointestinal system: Abdomen is nondistended, soft and nontender. No organomegaly or masses felt. Normal bowel sounds heard. Ext: no CCE.   Data Reviewed: I have personally reviewed following labs and imaging studies  CBC: No results for input(s): WBC, NEUTROABS, HGB, HCT, MCV, PLT in the last 168 hours. Basic Metabolic Panel: No results for input(s): NA, K, CL, CO2, GLUCOSE, BUN, CREATININE, CALCIUM, MG, PHOS in the last 168 hours. GFR: Estimated Creatinine Clearance: 85.3 mL/min (by C-G formula based on SCr of 0.84 mg/dL). Liver Function Tests: No results for input(s): AST, ALT, ALKPHOS, BILITOT, PROT, ALBUMIN in the last 168 hours. No results for input(s): LIPASE, AMYLASE in the last 168 hours. No results for input(s): AMMONIA in the last 168 hours. Coagulation Profile: No results for input(s): INR, PROTIME in the last 168 hours. Cardiac Enzymes: No results for input(s): CKTOTAL, CKMB, CKMBINDEX, TROPONINI in the last 168 hours. BNP (last 3 results) No results for input(s): PROBNP in the last 8760 hours. HbA1C: No results for input(s): HGBA1C in the last 72 hours. CBG: No results for input(s): GLUCAP in the last 168 hours. Lipid Profile: No results for input(s): CHOL, HDL, LDLCALC, TRIG, CHOLHDL, LDLDIRECT in the last 72 hours. Thyroid Function Tests: No results for input(s): TSH, T4TOTAL, FREET4, T3FREE, THYROIDAB in the last 72 hours. Anemia Panel: No results for input(s): VITAMINB12, FOLATE, FERRITIN, TIBC, IRON, RETICCTPCT in the last 72 hours. Sepsis Labs: No results for  input(s): PROCALCITON, LATICACIDVEN in the last 168 hours.  No results found for this or any previous visit (from the past 240 hour(s)).  Radiology Studies: No results found.  Scheduled Meds: . amLODipine  5 mg Oral Daily  . aspirin  325 mg Oral Daily  . feeding supplement (ENSURE ENLIVE)  237 mL Oral BID BM  . folic acid  1 mg Oral Daily  . heparin  5,000 Units Subcutaneous Q8H  . metoprolol tartrate  12.5 mg Oral BID  . multivitamin with minerals  1 tablet Oral Daily  . thiamine  100 mg Oral Daily   Continuous Infusions:   LOS: 26 days   Time spent: 16 minutes  Standley Dakins, MD [facility] Pager [phone]  If 7PM-7AM, please contact night-coverage [email or web address] Password [email or web address] 04/12/2018, 1:03 PM

## 2018-04-13 MED ORDER — PANTOPRAZOLE SODIUM 40 MG PO TBEC
40.0000 mg | DELAYED_RELEASE_TABLET | Freq: Every day | ORAL | Status: DC
  Administered 2018-04-13 – 2018-05-04 (×21): 40 mg via ORAL
  Filled 2018-04-13 (×21): qty 1

## 2018-04-13 NOTE — Progress Notes (Signed)
PROGRESS NOTE    Adam Koch  VHQ:[id number] DOB: 09/24/1955 DOA: 03/17/2018 PCP: Patient, No Pcp Per   Brief Narrative:   62 year old male with a history of alcohol abuse, tobacco use, hypertension presented with altered mental status. Patient is unable to provide any history secondary to his encephalopathy. Apparently, the patient was agitated and confused on the afternoon of 03/17/2018. According to his girlfriend, the patient walked outside into the backyard in his underwear and began pulling out flowers. Because of his agitation, she called the police who arrived and recommended EMS to bring the patient to the hospital. According to the patient's girlfriend, the patient was "acting drunk"on 03/15/2018.She states that she last saw him normal on 03/14/2018. She states that he frequently goes out to drink alcohol after he gets paid, although she cannot elaborate how much and how often and the patient drinks. However, she states that he has drank alcohol regularly for nearly 20 years. The patient continues to smoke tobacco although the amount is unclear. The been no reports of chest pain, shortness breath, vomiting, diarrhea, abdominal pain, chest pain. In the emergency department, the patient was afebrile hemodynamically stable saturating 100% on room air.   Assessment/Plan:  Acute metabolic encephalopathy -Multifactorial including Wernicke's encephalopathy, stroke, and possibly hypertensive encephalopathyand THC -04/08/18: Patient is pleasant, oriented, appears to be back to baseline.  He is confused at times but there is significant improvement in cognition noted.   -Serum B12--589 -TSH--1.094 -Urinalysis--neg for pyuria -Folic acid--11.5 -RPR--neg -Ammonia--21 -UDS--+THC -Thiamine 100mg  daily  Acute right MCA infarct -Neurology Consultappreciated-->30 day event monitor recommended.  -PT/OT evaluation-->SNF which is being arranged by CSW.  -Speech therapy eval-->regular  diet with thin liquids and patient has been tolerating this well.  -CT brain--Moderate area of edema/acute to subacute infarct in the right parietal lobe without evidence for hemorrhage or midline shift -MRI brain--acute infarctR-MCA territorywith few other smaller foci in the same territory;questionable infarct in the inferior cerebellum on the left -MRA brain--motion degraded, no hemodynamically significant stenosis -Carotid Duplex--negative for hemodynamically significant stenosis -CTA H&N--no large vessel occlusion, stable large right MCA infarct, mild interval increase in edema with mild mass-effect around area of infarct. Petechial hemorrhage of right parietal lobe. Distal right MCA parietal branch occlusion. -Echo--EF 55-60%, no WMA, no PFO noted -LDL--68 -HbA1C--5.4 -Antiplatelet--ASA 325 mg -9/13--case discussed by Dr. Arbutus Leas with neurology, Dr. Lurene Shadow vasogenic edema to gradually improve; No need for mannitol  Essential hypertension -currently on amlodipine. Added metoprolol 12.5 mg BID.  BP much better controlled.   Severe Protein malnutrition -continue Ensure  Disposition Plan:SNFwhen bed available, DSS involved.  Family Communication:none present during rounds  Consultants:neurology  Code Status: FULL   DVT Prophylaxis: [place] Heparin    Procedures: As Listed in Progress Note Above  Antibiotics: None  Subjective: Pt denies any specific complaints.  He is getting cabin fever and wants to leave the hospital. He remains confused about the year, month, day, time, etc.  No improvement in short-term memory loss.   Objective: Vitals:   04/12/18 0520 04/12/18 1323 04/12/18 2139 04/13/18 0517  BP: 128/73 139/88 126/78 (!) 141/80  Pulse: 72 70 70 63  Resp: 18 20 18 18   Temp: 98.5 F (36.9 C) 98.9 F (37.2 C) 98.7 F (37.1 C) 98.4 F (36.9 C)  TempSrc: Oral  Oral Oral  SpO2: 100% 100% 100% 100%  Weight:      Height:         Intake/Output Summary (Last 24 hours) at 04/13/2018 0810 Last  data filed at 04/12/2018 1825 Gross per 24 hour  Intake 1320 ml  Output -  Net 1320 ml   Filed Weights   03/18/18 0028 03/24/18 1113 04/09/18 1452  Weight: 59.7 kg 62.8 kg 65.3 kg   Examination:  General exam: thin male sitting up in bed, NAD. Cooperative. Intermittent confusion. Short term memory loss.   Respiratory system: Clear to auscultation. Respiratory effort normal. Cardiovascular system: normal s1,s2. No murmurs, rubs, gallops. Gastrointestinal system: Abdomen is nondistended, soft and nontender. No organomegaly or masses felt. Normal bowel sounds heard. Ext: no CCE.   Data Reviewed: I have personally reviewed following labs and imaging studies  CBC: No results for input(s): WBC, NEUTROABS, HGB, HCT, MCV, PLT in the last 168 hours. Basic Metabolic Panel: No results for input(s): NA, K, CL, CO2, GLUCOSE, BUN, CREATININE, CALCIUM, MG, PHOS in the last 168 hours. GFR: CrCl cannot be calculated (Patient's most recent lab result is older than the maximum 21 days allowed.). Liver Function Tests: No results for input(s): AST, ALT, ALKPHOS, BILITOT, PROT, ALBUMIN in the last 168 hours. No results for input(s): LIPASE, AMYLASE in the last 168 hours. No results for input(s): AMMONIA in the last 168 hours. Coagulation Profile: No results for input(s): INR, PROTIME in the last 168 hours. Cardiac Enzymes: No results for input(s): CKTOTAL, CKMB, CKMBINDEX, TROPONINI in the last 168 hours. BNP (last 3 results) No results for input(s): PROBNP in the last 8760 hours. HbA1C: No results for input(s): HGBA1C in the last 72 hours. CBG: No results for input(s): GLUCAP in the last 168 hours. Lipid Profile: No results for input(s): CHOL, HDL, LDLCALC, TRIG, CHOLHDL, LDLDIRECT in the last 72 hours. Thyroid Function Tests: No results for input(s): TSH, T4TOTAL, FREET4, T3FREE, THYROIDAB in the last 72 hours. Anemia  Panel: No results for input(s): VITAMINB12, FOLATE, FERRITIN, TIBC, IRON, RETICCTPCT in the last 72 hours. Sepsis Labs: No results for input(s): PROCALCITON, LATICACIDVEN in the last 168 hours.  No results found for this or any previous visit (from the past 240 hour(s)).  Radiology Studies: No results found.  Scheduled Meds: . amLODipine  5 mg Oral Daily  . aspirin  325 mg Oral Daily  . feeding supplement (ENSURE ENLIVE)  237 mL Oral BID BM  . folic acid  1 mg Oral Daily  . heparin  5,000 Units Subcutaneous Q8H  . metoprolol tartrate  12.5 mg Oral BID  . multivitamin with minerals  1 tablet Oral Daily  . thiamine  100 mg Oral Daily   Continuous Infusions:   LOS: 27 days   Time spent: 15 minutes  Standley Dakins, MD [facility] Pager [phone]  If 7PM-7AM, please contact night-coverage [email or web address] Password [email or web address] 04/13/2018, 8:10 AM

## 2018-04-14 NOTE — Progress Notes (Signed)
PROGRESS NOTE    Delfino [place]  CZY:[id number] DOB: Jun 16, 1956 DOA: 03/17/2018 PCP: Patient, No Pcp Per   Brief Narrative:   62 year old male with a history of alcohol abuse, tobacco use, hypertension presented with altered mental status. Patient is unable to provide any history secondary to his encephalopathy. Apparently, the patient was agitated and confused on the afternoon of 03/17/2018. According to his girlfriend, the patient walked outside into the backyard in his underwear and began pulling out flowers. Because of his agitation, she called the police who arrived and recommended EMS to bring the patient to the hospital. According to the patient's girlfriend, the patient was "acting drunk"on 03/15/2018.She states that she last saw him normal on 03/14/2018. She states that he frequently goes out to drink alcohol after he gets paid, although she cannot elaborate how much and how often and the patient drinks. However, she states that he has drank alcohol regularly for nearly 20 years. The patient continues to smoke tobacco although the amount is unclear. The been no reports of chest pain, shortness breath, vomiting, diarrhea, abdominal pain, chest pain. In the emergency department, the patient was afebrile hemodynamically stable saturating 100% on room air.   Assessment/Plan:  Acute metabolic encephalopathy -Multifactorial including Wernicke's encephalopathy, stroke, and possibly hypertensive encephalopathyand THC -Serum B12--589 -TSH--1.094 -Urinalysis--neg for pyuria -Folic acid--11.5 -RPR--neg -Ammonia--21 -UDS--+THC -Thiamine 100mg  daily -He still confused, but this is likely his baseline now.  Acute right MCA infarct -Neurology Consultappreciated-->30 day event monitor recommended.  -PT/OT evaluation-->SNF which is being arranged by CSW.  -Speech therapy eval-->regular diet with thin liquids and patient has been tolerating this well.  -CT brain--Moderate area of  edema/acute to subacute infarct in the right parietal lobe without evidence for hemorrhage or midline shift -MRI brain--acute infarctR-MCA territorywith few other smaller foci in the same territory;questionable infarct in the inferior cerebellum on the left -MRA brain--motion degraded, no hemodynamically significant stenosis -Carotid Duplex--negative for hemodynamically significant stenosis -CTA H&N--no large vessel occlusion, stable large right MCA infarct, mild interval increase in edema with mild mass-effect around area of infarct. Petechial hemorrhage of right parietal lobe. Distal right MCA parietal branch occlusion. -Echo--EF 55-60%, no WMA, no PFO noted -LDL--68 -HbA1C--5.4 -Antiplatelet--ASA 325 mg -9/13--case discussed by Dr. Arbutus Leas with neurology, Dr. Lurene Shadow vasogenic edema to gradually improve; No need for mannitol  Essential hypertension -Blood pressure stable on amlodipine and metoprolol  Severe Protein malnutrition -continue Ensure  Disposition Plan:SNFwhen bed available, DSS involved.  Family Communication:none present during rounds  Consultants:neurology  Code Status: FULL   DVT Prophylaxis: [place] Heparin    Procedures: As Listed in Progress Note Above  Antibiotics: None  Subjective: Denies any significant pain.  No shortness of breath.  Objective: Vitals:   04/13/18 1426 04/13/18 2148 04/14/18 0601 04/14/18 1308  BP: (!) 121/98 (!) 142/80 133/80 124/68  Pulse: 82 62 60 69  Resp:  15 17 18   Temp: 98.9 F (37.2 C) 98.6 F (37 C) 98.4 F (36.9 C) 98.2 F (36.8 C)  TempSrc: Oral Oral Oral   SpO2: 100% 100% 100% 100%  Weight:      Height:        Intake/Output Summary (Last 24 hours) at 04/14/2018 1805 Last data filed at 04/14/2018 1745 Gross per 24 hour  Intake 960 ml  Output 200 ml  Net 760 ml   Filed Weights   03/18/18 0028 03/24/18 1113 04/09/18 1452  Weight: 59.7 kg 62.8 kg 65.3 kg    Examination:  General exam: Alert, awake,  no distress Respiratory system: Clear to auscultation. Respiratory effort normal. Cardiovascular system:RRR. No murmurs, rubs, gallops. Gastrointestinal system: Abdomen is nondistended, soft and nontender. No organomegaly or masses felt. Normal bowel sounds heard.   Data Reviewed: I have personally reviewed following labs and imaging studies  CBC: No results for input(s): WBC, NEUTROABS, HGB, HCT, MCV, PLT in the last 168 hours. Basic Metabolic Panel: No results for input(s): NA, K, CL, CO2, GLUCOSE, BUN, CREATININE, CALCIUM, MG, PHOS in the last 168 hours. GFR: CrCl cannot be calculated (Patient's most recent lab result is older than the maximum 21 days allowed.). Liver Function Tests: No results for input(s): AST, ALT, ALKPHOS, BILITOT, PROT, ALBUMIN in the last 168 hours. No results for input(s): LIPASE, AMYLASE in the last 168 hours. No results for input(s): AMMONIA in the last 168 hours. Coagulation Profile: No results for input(s): INR, PROTIME in the last 168 hours. Cardiac Enzymes: No results for input(s): CKTOTAL, CKMB, CKMBINDEX, TROPONINI in the last 168 hours. BNP (last 3 results) No results for input(s): PROBNP in the last 8760 hours. HbA1C: No results for input(s): HGBA1C in the last 72 hours. CBG: No results for input(s): GLUCAP in the last 168 hours. Lipid Profile: No results for input(s): CHOL, HDL, LDLCALC, TRIG, CHOLHDL, LDLDIRECT in the last 72 hours. Thyroid Function Tests: No results for input(s): TSH, T4TOTAL, FREET4, T3FREE, THYROIDAB in the last 72 hours. Anemia Panel: No results for input(s): VITAMINB12, FOLATE, FERRITIN, TIBC, IRON, RETICCTPCT in the last 72 hours. Sepsis Labs: No results for input(s): PROCALCITON, LATICACIDVEN in the last 168 hours.  No results found for this or any previous visit (from the past 240 hour(s)).  Radiology Studies: No results found.  Scheduled Meds: . amLODipine  5 mg  Oral Daily  . aspirin  325 mg Oral Daily  . feeding supplement (ENSURE ENLIVE)  237 mL Oral BID BM  . folic acid  1 mg Oral Daily  . heparin  5,000 Units Subcutaneous Q8H  . metoprolol tartrate  12.5 mg Oral BID  . multivitamin with minerals  1 tablet Oral Daily  . pantoprazole  40 mg Oral Q0600  . thiamine  100 mg Oral Daily   Continuous Infusions:   LOS: 28 days   Time spent: 15 minutes  Erick Blinks, MD [facility] Pager [phone]  If 7PM-7AM, please contact night-coverage [email or web address] Password [email or web address] 04/14/2018, 6:05 PM

## 2018-04-14 NOTE — Care Management Note (Signed)
Case Management Note  Patient Details  Name: Adam Koch MRN: [id number] Date of Birth: 1956/06/12   If discussed at Long Length of Stay Meetings, dates discussed:  04/14/18  Additional Comments:  Malcolm Metro, RN 04/14/2018, 11:42 AM

## 2018-04-15 NOTE — Progress Notes (Signed)
PROGRESS NOTE    Adam Koch  ZOX:[id number] DOB: 22-Oct-1955 DOA: 03/17/2018 PCP: Patient, No Pcp Per   Brief Narrative:   62 year old male with a history of alcohol abuse, tobacco use, hypertension presented with altered mental status. Patient is unable to provide any history secondary to his encephalopathy. Apparently, the patient was agitated and confused on the afternoon of 03/17/2018. According to his girlfriend, the patient walked outside into the backyard in his underwear and began pulling out flowers. Because of his agitation, she called the police who arrived and recommended EMS to bring the patient to the hospital. According to the patient's girlfriend, the patient was "acting drunk"on 03/15/2018.She states that she last saw him normal on 03/14/2018. She states that he frequently goes out to drink alcohol after he gets paid, although she cannot elaborate how much and how often and the patient drinks. However, she states that he has drank alcohol regularly for nearly 20 years. The patient continues to smoke tobacco although the amount is unclear. The been no reports of chest pain, shortness breath, vomiting, diarrhea, abdominal pain, chest pain. In the emergency department, the patient was afebrile hemodynamically stable saturating 100% on room air.   Assessment/Plan:  Acute metabolic encephalopathy -Multifactorial including Wernicke's encephalopathy, stroke, and possibly hypertensive encephalopathyand THC -Serum B12--589 -TSH--1.094 -Urinalysis--neg for pyuria -Folic acid--11.5 -RPR--neg -Ammonia--21 -UDS--+THC -Thiamine 100mg  daily -He is still confused, but this is likely his baseline now.  Acute right MCA infarct -Neurology Consultappreciated-->30 day event monitor recommended.  -PT/OT evaluation-->SNF which is being arranged by CSW.  -Speech therapy eval-->regular diet with thin liquids and patient has been tolerating this well.  -CT brain--Moderate area of  edema/acute to subacute infarct in the right parietal lobe without evidence for hemorrhage or midline shift -MRI brain--acute infarctR-MCA territorywith few other smaller foci in the same territory;questionable infarct in the inferior cerebellum on the left -MRA brain--motion degraded, no hemodynamically significant stenosis -Carotid Duplex--negative for hemodynamically significant stenosis -CTA H&N--no large vessel occlusion, stable large right MCA infarct, mild interval increase in edema with mild mass-effect around area of infarct. Petechial hemorrhage of right parietal lobe. Distal right MCA parietal branch occlusion. -Echo--EF 55-60%, no WMA, no PFO noted -LDL--68 -HbA1C--5.4 -Antiplatelet--ASA 325 mg -9/13--case discussed by Dr. Arbutus Leas with neurology, Dr. Lurene Shadow vasogenic edema to gradually improve; No need for mannitol  Essential hypertension -Blood pressure stable on amlodipine and metoprolol  Severe Protein malnutrition -continue Ensure  Disposition Plan:SNFwhen bed available, DSS involved.  Family Communication:none present during rounds  Consultants:neurology  Code Status: FULL   DVT Prophylaxis: [place] Heparin    Procedures: As Listed in Progress Note Above  Antibiotics: None  Subjective: Sitting up in bed.  Denies any complaints.  Objective: Vitals:   04/14/18 1308 04/14/18 2116 04/15/18 0552 04/15/18 1319  BP: 124/68 91/80 137/74 117/74  Pulse: 69 64 60 69  Resp: 18   18  Temp: 98.2 F (36.8 C) 98.3 F (36.8 C) 98.4 F (36.9 C) 97.8 F (36.6 C)  TempSrc:  Oral Oral   SpO2: 100% 99% 100% 100%  Weight:      Height:        Intake/Output Summary (Last 24 hours) at 04/15/2018 1547 Last data filed at 04/15/2018 1505 Gross per 24 hour  Intake 960 ml  Output -  Net 960 ml   Filed Weights   03/18/18 0028 03/24/18 1113 04/09/18 1452  Weight: 59.7 kg 62.8 kg 65.3 kg   Examination:  General exam: Alert, awake, no  distress Respiratory  system: Clear to auscultation. Respiratory effort normal. Cardiovascular system:RRR. No murmurs, rubs, gallops. Gastrointestinal system: Abdomen is nondistended, soft and nontender. No organomegaly or masses felt. Normal bowel sounds heard.  Data Reviewed: I have personally reviewed following labs and imaging studies  CBC: No results for input(s): WBC, NEUTROABS, HGB, HCT, MCV, PLT in the last 168 hours. Basic Metabolic Panel: No results for input(s): NA, K, CL, CO2, GLUCOSE, BUN, CREATININE, CALCIUM, MG, PHOS in the last 168 hours. GFR: CrCl cannot be calculated (Patient's most recent lab result is older than the maximum 21 days allowed.). Liver Function Tests: No results for input(s): AST, ALT, ALKPHOS, BILITOT, PROT, ALBUMIN in the last 168 hours. No results for input(s): LIPASE, AMYLASE in the last 168 hours. No results for input(s): AMMONIA in the last 168 hours. Coagulation Profile: No results for input(s): INR, PROTIME in the last 168 hours. Cardiac Enzymes: No results for input(s): CKTOTAL, CKMB, CKMBINDEX, TROPONINI in the last 168 hours. BNP (last 3 results) No results for input(s): PROBNP in the last 8760 hours. HbA1C: No results for input(s): HGBA1C in the last 72 hours. CBG: No results for input(s): GLUCAP in the last 168 hours. Lipid Profile: No results for input(s): CHOL, HDL, LDLCALC, TRIG, CHOLHDL, LDLDIRECT in the last 72 hours. Thyroid Function Tests: No results for input(s): TSH, T4TOTAL, FREET4, T3FREE, THYROIDAB in the last 72 hours. Anemia Panel: No results for input(s): VITAMINB12, FOLATE, FERRITIN, TIBC, IRON, RETICCTPCT in the last 72 hours. Sepsis Labs: No results for input(s): PROCALCITON, LATICACIDVEN in the last 168 hours.  No results found for this or any previous visit (from the past 240 hour(s)).  Radiology Studies: No results found.  Scheduled Meds: . amLODipine  5 mg Oral Daily  . aspirin  325 mg Oral Daily  . feeding  supplement (ENSURE ENLIVE)  237 mL Oral BID BM  . folic acid  1 mg Oral Daily  . heparin  5,000 Units Subcutaneous Q8H  . metoprolol tartrate  12.5 mg Oral BID  . multivitamin with minerals  1 tablet Oral Daily  . pantoprazole  40 mg Oral Q0600  . thiamine  100 mg Oral Daily   Continuous Infusions:   LOS: 29 days   Time spent: 15 minutes  Erick Blinks, MD [facility] Pager [phone]  If 7PM-7AM, please contact night-coverage [email or web address] Password [email or web address] 04/15/2018, 3:47 PM

## 2018-04-16 NOTE — Progress Notes (Signed)
PROGRESS NOTE    Adam Koch  WUJ:[id number] DOB: 08-28-1955 DOA: 03/17/2018 PCP: Patient, No Pcp Per   Brief Narrative:   62 year old male with a history of alcohol abuse, tobacco use, hypertension presented with altered mental status. Patient is unable to provide any history secondary to his encephalopathy. Apparently, the patient was agitated and confused on the afternoon of 03/17/2018. According to his girlfriend, the patient walked outside into the backyard in his underwear and began pulling out flowers. Because of his agitation, she called the police who arrived and recommended EMS to bring the patient to the hospital. According to the patient's girlfriend, the patient was "acting drunk"on 03/15/2018.She states that she last saw him normal on 03/14/2018. She states that he frequently goes out to drink alcohol after he gets paid, although she cannot elaborate how much and how often and the patient drinks. However, she states that he has drank alcohol regularly for nearly 20 years. The patient continues to smoke tobacco although the amount is unclear. The been no reports of chest pain, shortness breath, vomiting, diarrhea, abdominal pain, chest pain. In the emergency department, the patient was afebrile hemodynamically stable saturating 100% on room air.   Assessment/Plan:  Acute metabolic encephalopathy -Multifactorial including Wernicke's encephalopathy, stroke, and possibly hypertensive encephalopathyand THC -Serum B12--589 -TSH--1.094 -Urinalysis--neg for pyuria -Folic acid--11.5 -RPR--neg -Ammonia--21 -UDS--+THC -Thiamine 100mg  daily -He is still confused, but this is likely his baseline now.  Acute right MCA infarct -Neurology Consultappreciated-->30 day event monitor recommended.  Patient was monitored in the hospital on telemetry and did not have any significant events -PT/OT evaluation-->SNF which is being arranged by CSW.  -Speech therapy eval-->regular diet  with thin liquids and patient has been tolerating this well.  -CT brain--Moderate area of edema/acute to subacute infarct in the right parietal lobe without evidence for hemorrhage or midline shift -MRI brain--acute infarctR-MCA territorywith few other smaller foci in the same territory;questionable infarct in the inferior cerebellum on the left -MRA brain--motion degraded, no hemodynamically significant stenosis -Carotid Duplex--negative for hemodynamically significant stenosis -CTA H&N--no large vessel occlusion, stable large right MCA infarct, mild interval increase in edema with mild mass-effect around area of infarct. Petechial hemorrhage of right parietal lobe. Distal right MCA parietal branch occlusion. -Echo--EF 55-60%, no WMA, no PFO noted -LDL--68 -HbA1C--5.4 -Antiplatelet--ASA 325 mg -9/13--case discussed by Dr. Arbutus Leas with neurology, Dr. Lurene Shadow vasogenic edema to gradually improve; No need for mannitol  Essential hypertension -Blood pressure stable on amlodipine and metoprolol  Severe Protein malnutrition -continue Ensure  Disposition Plan:Patient is difficult to place.  Still waiting on bed offers.  DSS has guardianship.  Family Communication:none present during rounds  Consultants:neurology  Code Status: FULL   DVT Prophylaxis: [place] Heparin    Procedures: As Listed in Progress Note Above  Antibiotics: None  Subjective: Denies any chest pain or shortness of breath.  Sitting up in bed.  Objective: Vitals:   04/15/18 1319 04/15/18 2100 04/16/18 0610 04/16/18 1322  BP: 117/74 131/81 134/73 96/62  Pulse: 69 75 62 73  Resp: 18 18 18 18   Temp: 97.8 F (36.6 C) 98.4 F (36.9 C) 98.4 F (36.9 C) 98 F (36.7 C)  TempSrc:  Oral Oral   SpO2: 100% 100% 100% 100%  Weight:      Height:        Intake/Output Summary (Last 24 hours) at 04/16/2018 1740 Last data filed at 04/16/2018 1200 Gross per 24 hour  Intake 1440 ml    Output -  Net 1440 ml  Filed Weights   03/18/18 0028 03/24/18 1113 04/09/18 1452  Weight: 59.7 kg 62.8 kg 65.3 kg   Examination:  General exam: Alert, awake, no distress Respiratory system: Clear to auscultation. Respiratory effort normal. Cardiovascular system:RRR. No murmurs, rubs, gallops. Gastrointestinal system: Abdomen is nondistended, soft and nontender. No organomegaly or masses felt. Normal bowel sounds heard.  Data Reviewed: I have personally reviewed following labs and imaging studies  CBC: No results for input(s): WBC, NEUTROABS, HGB, HCT, MCV, PLT in the last 168 hours. Basic Metabolic Panel: No results for input(s): NA, K, CL, CO2, GLUCOSE, BUN, CREATININE, CALCIUM, MG, PHOS in the last 168 hours. GFR: CrCl cannot be calculated (Patient's most recent lab result is older than the maximum 21 days allowed.). Liver Function Tests: No results for input(s): AST, ALT, ALKPHOS, BILITOT, PROT, ALBUMIN in the last 168 hours. No results for input(s): LIPASE, AMYLASE in the last 168 hours. No results for input(s): AMMONIA in the last 168 hours. Coagulation Profile: No results for input(s): INR, PROTIME in the last 168 hours. Cardiac Enzymes: No results for input(s): CKTOTAL, CKMB, CKMBINDEX, TROPONINI in the last 168 hours. BNP (last 3 results) No results for input(s): PROBNP in the last 8760 hours. HbA1C: No results for input(s): HGBA1C in the last 72 hours. CBG: No results for input(s): GLUCAP in the last 168 hours. Lipid Profile: No results for input(s): CHOL, HDL, LDLCALC, TRIG, CHOLHDL, LDLDIRECT in the last 72 hours. Thyroid Function Tests: No results for input(s): TSH, T4TOTAL, FREET4, T3FREE, THYROIDAB in the last 72 hours. Anemia Panel: No results for input(s): VITAMINB12, FOLATE, FERRITIN, TIBC, IRON, RETICCTPCT in the last 72 hours. Sepsis Labs: No results for input(s): PROCALCITON, LATICACIDVEN in the last 168 hours.  No results found for this or any  previous visit (from the past 240 hour(s)).  Radiology Studies: No results found.  Scheduled Meds: . amLODipine  5 mg Oral Daily  . aspirin  325 mg Oral Daily  . feeding supplement (ENSURE ENLIVE)  237 mL Oral BID BM  . folic acid  1 mg Oral Daily  . heparin  5,000 Units Subcutaneous Q8H  . metoprolol tartrate  12.5 mg Oral BID  . multivitamin with minerals  1 tablet Oral Daily  . pantoprazole  40 mg Oral Q0600  . thiamine  100 mg Oral Daily   Continuous Infusions:   LOS: 30 days   Time spent: 15 minutes  Erick Blinks, MD [facility] Pager [phone]  If 7PM-7AM, please contact night-coverage [email or web address] Password [email or web address] 04/16/2018, 5:40 PM

## 2018-04-17 NOTE — Progress Notes (Signed)
PROGRESS NOTE    Adam Koch  ZOX:[id number] DOB: 02-13-1956 DOA: 03/17/2018 PCP: Patient, No Pcp Per   Brief Narrative:   62 year old male with a history of alcohol abuse, tobacco use, hypertension presented with altered mental status. Patient is unable to provide any history secondary to his encephalopathy. Apparently, the patient was agitated and confused on the afternoon of 03/17/2018. According to his girlfriend, the patient walked outside into the backyard in his underwear and began pulling out flowers. Because of his agitation, she called the police who arrived and recommended EMS to bring the patient to the hospital. According to the patient's girlfriend, the patient was "acting drunk"on 03/15/2018.She states that she last saw him normal on 03/14/2018. She states that he frequently goes out to drink alcohol after he gets paid, although she cannot elaborate how much and how often and the patient drinks. However, she states that he has drank alcohol regularly for nearly 20 years. The patient continues to smoke tobacco although the amount is unclear. The been no reports of chest pain, shortness breath, vomiting, diarrhea, abdominal pain, chest pain. In the emergency department, the patient was afebrile hemodynamically stable saturating 100% on room air.   Assessment/Plan:  Acute metabolic encephalopathy -Multifactorial including Wernicke's encephalopathy, stroke, and possibly hypertensive encephalopathyand THC -Serum B12--589 -TSH--1.094 -Urinalysis--neg for pyuria -Folic acid--11.5 -RPR--neg -Ammonia--21 -UDS--+THC -Thiamine 100mg  daily -He is still confused, but this is likely his baseline now.  Acute right MCA infarct -Neurology Consultappreciated-->30 day event monitor recommended.  Patient was monitored in the hospital on telemetry and did not have any significant events -PT/OT evaluation-->SNF which is being arranged by CSW.  -Speech therapy eval-->regular diet  with thin liquids and patient has been tolerating this well.  -CT brain--Moderate area of edema/acute to subacute infarct in the right parietal lobe without evidence for hemorrhage or midline shift -MRI brain--acute infarctR-MCA territorywith few other smaller foci in the same territory;questionable infarct in the inferior cerebellum on the left -MRA brain--motion degraded, no hemodynamically significant stenosis -Carotid Duplex--negative for hemodynamically significant stenosis -CTA H&N--no large vessel occlusion, stable large right MCA infarct, mild interval increase in edema with mild mass-effect around area of infarct. Petechial hemorrhage of right parietal lobe. Distal right MCA parietal branch occlusion. -Echo--EF 55-60%, no WMA, no PFO noted -LDL--68 -HbA1C--5.4 -Antiplatelet--ASA 325 mg -9/13--case discussed by Dr. Arbutus Leas with neurology, Dr. Lurene Shadow vasogenic edema to gradually improve; No need for mannitol  Essential hypertension -Blood pressure stable on amlodipine and metoprolol  Severe Protein malnutrition -continue Ensure  Disposition Plan:Patient is difficult to place.  Still waiting on bed offers.  DSS has guardianship.  Family Communication:none present during rounds  Consultants:neurology  Code Status: FULL   DVT Prophylaxis: [place] Heparin    Procedures: As Listed in Progress Note Above  Antibiotics: None  Subjective: Patient eating lunch during my visit.  Denies any shortness of breath or chest pain.  Does not have any new complaints.  Objective: Vitals:   04/16/18 1322 04/16/18 2038 04/16/18 2108 04/17/18 0543  BP: 96/62  110/79 (!) 158/70  Pulse: 73  66 61  Resp: 18  18 18   Temp: 98 F (36.7 C)  98.4 F (36.9 C) 98.1 F (36.7 C)  TempSrc:   Oral Oral  SpO2: 100% 98% 100% 100%  Weight:      Height:        Intake/Output Summary (Last 24 hours) at 04/17/2018 1439 Last data filed at 04/17/2018 0900 Gross per  24 hour  Intake 960 ml  Output -  Net 960 ml   Filed Weights   03/18/18 0028 03/24/18 1113 04/09/18 1452  Weight: 59.7 kg 62.8 kg 65.3 kg   Examination:  General exam: Alert, awake, no distress Respiratory system: Clear to auscultation. Respiratory effort normal. Cardiovascular system:RRR. No murmurs, rubs, gallops. Gastrointestinal system: Abdomen is nondistended, soft and nontender. No organomegaly or masses felt. Normal bowel sounds heard.  Data Reviewed: I have personally reviewed following labs and imaging studies  CBC: No results for input(s): WBC, NEUTROABS, HGB, HCT, MCV, PLT in the last 168 hours. Basic Metabolic Panel: No results for input(s): NA, K, CL, CO2, GLUCOSE, BUN, CREATININE, CALCIUM, MG, PHOS in the last 168 hours. GFR: CrCl cannot be calculated (Patient's most recent lab result is older than the maximum 21 days allowed.). Liver Function Tests: No results for input(s): AST, ALT, ALKPHOS, BILITOT, PROT, ALBUMIN in the last 168 hours. No results for input(s): LIPASE, AMYLASE in the last 168 hours. No results for input(s): AMMONIA in the last 168 hours. Coagulation Profile: No results for input(s): INR, PROTIME in the last 168 hours. Cardiac Enzymes: No results for input(s): CKTOTAL, CKMB, CKMBINDEX, TROPONINI in the last 168 hours. BNP (last 3 results) No results for input(s): PROBNP in the last 8760 hours. HbA1C: No results for input(s): HGBA1C in the last 72 hours. CBG: No results for input(s): GLUCAP in the last 168 hours. Lipid Profile: No results for input(s): CHOL, HDL, LDLCALC, TRIG, CHOLHDL, LDLDIRECT in the last 72 hours. Thyroid Function Tests: No results for input(s): TSH, T4TOTAL, FREET4, T3FREE, THYROIDAB in the last 72 hours. Anemia Panel: No results for input(s): VITAMINB12, FOLATE, FERRITIN, TIBC, IRON, RETICCTPCT in the last 72 hours. Sepsis Labs: No results for input(s): PROCALCITON, LATICACIDVEN in the last 168 hours.  No results  found for this or any previous visit (from the past 240 hour(s)).  Radiology Studies: No results found.  Scheduled Meds: . amLODipine  5 mg Oral Daily  . aspirin  325 mg Oral Daily  . feeding supplement (ENSURE ENLIVE)  237 mL Oral BID BM  . folic acid  1 mg Oral Daily  . heparin  5,000 Units Subcutaneous Q8H  . metoprolol tartrate  12.5 mg Oral BID  . multivitamin with minerals  1 tablet Oral Daily  . pantoprazole  40 mg Oral Q0600  . thiamine  100 mg Oral Daily   Continuous Infusions:   LOS: 31 days   Time spent: 15 minutes  Erick Blinks, MD [facility] Pager [phone]  If 7PM-7AM, please contact night-coverage [email or web address] Password [facility] 04/17/2018, 2:39 PM

## 2018-04-17 NOTE — Progress Notes (Signed)
Nutrition Follow-up  DOCUMENTATION CODES:  Severe malnutrition in context of social or environmental circumstances  INTERVENTION:  ReOffered patient some new comfort foods. Will place on tray.   Decrease Ensure Enlive to po BID, each supplement provides 350 kcal and 20 grams of protein  NUTRITION DIAGNOSIS:  Severe Malnutrition related to social / environmental circumstances as evidenced by severe muscle/fat depletion  Ongoing, resolving  GOAL:  Patient will meet greater than or equal to 90% of their needs  MET  MONITOR:  PO intake, Supplement acceptance, Weight trends, I & O's  ASSESSMENT:  62 year old male who presented to the ED on 9/10 after being found wandering in the streets confused. PMH significant for EtOH abuse, tobacco use, hypertension. Pt found to have acute CVA.  Following up with patient again due to prolonged length of stay.   Interval history from past week is unremarkable. Patient remains quite stable. CSW still working to get patient placed. DSS now has guardianship of patient.   Per Re-review of meal documentation. Patient has eaten 100% of every documented meal since seen 1 week ago. All together, he has eaten 100% of every single documented meal since 9/13. In addition, he has Ensure ENlive ordered BID and he has been consuming all of these.   Patient alert sitting on end of bed. His ability to converse is about same as last week.   He again reports a good appetite. Still has no complaints. No n/v/c/d.   Patient re weighed patient on standing scale. Today,  He was 146 lbs today. This is a gain of >14 lbs since admitted. Wt gain was very desired.   He was fine with reducing Ensure to q24 hrs. To lessen monotony, we discussed some possible changes to meals. Will implement  Labs:  a1C: 5.4 Meds: Ensure Enlive BID- Consuming 100% of them. MVI w/ min, Thiamin, Folate, ppi  Diet Order:   Diet Order            Diet regular Room service appropriate? Yes;  Fluid consistency: Thin  Diet effective now             EDUCATION NEEDS:  No education needs have been identified at this time  Skin:  Skin Assessment: Reviewed RN Assessment  Last BM:  10/1  Height:  Ht Readings from Last 1 Encounters:  03/18/18 _0  (1.854 m)   Weight:  Wt Readings from Last 1 Encounters:  04/17/18 66.2 kg   Wt Readings from Last 10 Encounters:  04/17/18 66.2 kg  05/04/12 72.6 kg   Ideal Body Weight:  83.64 kg  BMI:  Body mass index is 19.26 kg/m.  Estimated Nutritional Needs:  Kcal:  (2000-2200 kcals (30-33 kcal/kg bw) Protein:  80-93g Pro (1.2-1.4 g/kg bw) Fluid:  >2 L fluid (30 ml/kg bw)  Burtis Junes RD, LDN, CNSC Clinical Nutrition Available Tues-Sat via Pager: [phone] 04/17/2018 3:18 PM

## 2018-04-17 NOTE — Clinical Social Work Note (Signed)
Message left for Hiram Gash requesting update on status of Medicaid and disability.    Jareb Radoncic, [name], LCSW

## 2018-04-18 NOTE — Progress Notes (Signed)
PROGRESS NOTE    Adam Koch  ZOX:[id number] DOB: 03-08-1956 DOA: 03/17/2018 PCP: Patient, No Pcp Per   Brief Narrative:   62 year old male with a history of alcohol abuse, tobacco use, hypertension presented with altered mental status. Patient is unable to provide any history secondary to his encephalopathy. Apparently, the patient was agitated and confused on the afternoon of 03/17/2018. According to his girlfriend, the patient walked outside into the backyard in his underwear and began pulling out flowers. Because of his agitation, she called the police who arrived and recommended EMS to bring the patient to the hospital. According to the patient's girlfriend, the patient was "acting drunk"on 03/15/2018.She states that she last saw him normal on 03/14/2018. She states that he frequently goes out to drink alcohol after he gets paid, although she cannot elaborate how much and how often and the patient drinks. However, she states that he has drank alcohol regularly for nearly 20 years. The patient continues to smoke tobacco although the amount is unclear. The been no reports of chest pain, shortness breath, vomiting, diarrhea, abdominal pain, chest pain. In the emergency department, the patient was afebrile hemodynamically stable saturating 100% on room air.   Assessment/Plan:  Acute metabolic encephalopathy -Multifactorial including Wernicke's encephalopathy, stroke, and possibly hypertensive encephalopathyand THC -Serum B12--589 -TSH--1.094 -Urinalysis--neg for pyuria -Folic acid--11.5 -RPR--neg -Ammonia--21 -UDS--+THC -Thiamine 100mg  daily -He is still confused, but this is likely his baseline now.  Acute right MCA infarct -Neurology Consultappreciated-->30 day event monitor recommended.  Patient was monitored in the hospital on telemetry and did not have any significant events -PT/OT evaluation-->SNF which is being arranged by CSW.  -Speech therapy eval-->regular diet  with thin liquids and patient has been tolerating this well.  -CT brain--Moderate area of edema/acute to subacute infarct in the right parietal lobe without evidence for hemorrhage or midline shift -MRI brain--acute infarctR-MCA territorywith few other smaller foci in the same territory;questionable infarct in the inferior cerebellum on the left -MRA brain--motion degraded, no hemodynamically significant stenosis -Carotid Duplex--negative for hemodynamically significant stenosis -CTA H&N--no large vessel occlusion, stable large right MCA infarct, mild interval increase in edema with mild mass-effect around area of infarct. Petechial hemorrhage of right parietal lobe. Distal right MCA parietal branch occlusion. -Echo--EF 55-60%, no WMA, no PFO noted -LDL--68 -HbA1C--5.4 -Antiplatelet--ASA 325 mg -9/13--case discussed by Dr. Arbutus Leas with neurology, Dr. Lurene Shadow vasogenic edema to gradually improve; No need for mannitol  Essential hypertension -Blood pressure stable on amlodipine and metoprolol  Severe Protein malnutrition -continue Ensure  Disposition Plan:Patient is difficult to place.  Still waiting on bed offers.  DSS has guardianship.  Family Communication:none present during rounds  Consultants:neurology  Code Status: FULL   DVT Prophylaxis: [place] Heparin    Procedures: As Listed in Progress Note Above  Antibiotics: None  Subjective: No new complaints. Feels ok  Objective: Vitals:   04/17/18 2059 04/17/18 2229 04/18/18 0500 04/18/18 1431  BP:  124/70 136/82 108/74  Pulse:  71 (!) 56 87  Resp:  16 16 16   Temp:  98.2 F (36.8 C) 98 F (36.7 C) 98.4 F (36.9 C)  TempSrc:  Oral Oral Oral  SpO2: 99% 100% 100% 100%  Weight:      Height:        Intake/Output Summary (Last 24 hours) at 04/18/2018 1548 Last data filed at 04/17/2018 1700 Gross per 24 hour  Intake 240 ml  Output -  Net 240 ml   Filed Weights   03/24/18 1113  04/09/18 1452 04/17/18  1512  Weight: 62.8 kg 65.3 kg 66.2 kg   Examination:  General exam: Alert, awake, no distress Respiratory system: Clear to auscultation. Respiratory effort normal. Cardiovascular system:RRR. No murmurs, rubs, gallops. Gastrointestinal system: Abdomen is nondistended, soft and nontender. No organomegaly or masses felt. Normal bowel sounds heard.  Data Reviewed: I have personally reviewed following labs and imaging studies  CBC: No results for input(s): WBC, NEUTROABS, HGB, HCT, MCV, PLT in the last 168 hours. Basic Metabolic Panel: No results for input(s): NA, K, CL, CO2, GLUCOSE, BUN, CREATININE, CALCIUM, MG, PHOS in the last 168 hours. GFR: CrCl cannot be calculated (Patient's most recent lab result is older than the maximum 21 days allowed.). Liver Function Tests: No results for input(s): AST, ALT, ALKPHOS, BILITOT, PROT, ALBUMIN in the last 168 hours. No results for input(s): LIPASE, AMYLASE in the last 168 hours. No results for input(s): AMMONIA in the last 168 hours. Coagulation Profile: No results for input(s): INR, PROTIME in the last 168 hours. Cardiac Enzymes: No results for input(s): CKTOTAL, CKMB, CKMBINDEX, TROPONINI in the last 168 hours. BNP (last 3 results) No results for input(s): PROBNP in the last 8760 hours. HbA1C: No results for input(s): HGBA1C in the last 72 hours. CBG: No results for input(s): GLUCAP in the last 168 hours. Lipid Profile: No results for input(s): CHOL, HDL, LDLCALC, TRIG, CHOLHDL, LDLDIRECT in the last 72 hours. Thyroid Function Tests: No results for input(s): TSH, T4TOTAL, FREET4, T3FREE, THYROIDAB in the last 72 hours. Anemia Panel: No results for input(s): VITAMINB12, FOLATE, FERRITIN, TIBC, IRON, RETICCTPCT in the last 72 hours. Sepsis Labs: No results for input(s): PROCALCITON, LATICACIDVEN in the last 168 hours.  No results found for this or any previous visit (from the past 240 hour(s)).  Radiology  Studies: No results found.  Scheduled Meds: . amLODipine  5 mg Oral Daily  . aspirin  325 mg Oral Daily  . feeding supplement (ENSURE ENLIVE)  237 mL Oral BID BM  . folic acid  1 mg Oral Daily  . heparin  5,000 Units Subcutaneous Q8H  . metoprolol tartrate  12.5 mg Oral BID  . multivitamin with minerals  1 tablet Oral Daily  . pantoprazole  40 mg Oral Q0600  . thiamine  100 mg Oral Daily   Continuous Infusions:   LOS: 32 days   Time spent: 15 minutes  Erick Blinks, MD [facility] Pager [phone]  If 7PM-7AM, please contact night-coverage [email or web address] Password [facility] 04/18/2018, 3:48 PM

## 2018-04-19 NOTE — Progress Notes (Signed)
PROGRESS NOTE    Adam Koch  ZOX:[id number] DOB: January 18, 1956 DOA: 03/17/2018 PCP: Patient, No Pcp Per   Brief Narrative:   62 year old male with a history of alcohol abuse, tobacco use, hypertension presented with altered mental status. Patient is unable to provide any history secondary to his encephalopathy. Apparently, the patient was agitated and confused on the afternoon of 03/17/2018. According to his girlfriend, the patient walked outside into the backyard in his underwear and began pulling out flowers. Because of his agitation, she called the police who arrived and recommended EMS to bring the patient to the hospital. According to the patient's girlfriend, the patient was "acting drunk"on 03/15/2018.She states that she last saw him normal on 03/14/2018. She states that he frequently goes out to drink alcohol after he gets paid, although she cannot elaborate how much and how often and the patient drinks. However, she states that he has drank alcohol regularly for nearly 20 years. The patient continues to smoke tobacco although the amount is unclear. The been no reports of chest pain, shortness breath, vomiting, diarrhea, abdominal pain, chest pain. In the emergency department, the patient was afebrile hemodynamically stable saturating 100% on room air.   Assessment/Plan:  Acute metabolic encephalopathy -Multifactorial including Wernicke's encephalopathy, stroke, and possibly hypertensive encephalopathyand THC -Serum B12--589 -TSH--1.094 -Urinalysis--neg for pyuria -Folic acid--11.5 -RPR--neg -Ammonia--21 -UDS--+THC -Thiamine 100mg  daily -He is still confused, but this is likely his baseline now.  Acute right MCA infarct -Neurology Consultappreciated-->30 day event monitor recommended.  Patient was monitored in the hospital on telemetry and did not have any significant events -PT/OT evaluation-->SNF which is being arranged by CSW.  -Speech therapy eval-->regular diet  with thin liquids and patient has been tolerating this well.  -CT brain--Moderate area of edema/acute to subacute infarct in the right parietal lobe without evidence for hemorrhage or midline shift -MRI brain--acute infarctR-MCA territorywith few other smaller foci in the same territory;questionable infarct in the inferior cerebellum on the left -MRA brain--motion degraded, no hemodynamically significant stenosis -Carotid Duplex--negative for hemodynamically significant stenosis -CTA H&N--no large vessel occlusion, stable large right MCA infarct, mild interval increase in edema with mild mass-effect around area of infarct. Petechial hemorrhage of right parietal lobe. Distal right MCA parietal branch occlusion. -Echo--EF 55-60%, no WMA, no PFO noted -LDL--68 -HbA1C--5.4 -Antiplatelet--ASA 325 mg -9/13--case discussed by Dr. Arbutus Leas with neurology, Dr. Lurene Shadow vasogenic edema to gradually improve; No need for mannitol  Essential hypertension -Blood pressure stable on amlodipine and metoprolol  Severe Protein malnutrition -continue Ensure  Disposition Plan:Patient is difficult to place.  Still waiting on bed offers.  DSS has guardianship.  Family Communication:none present during rounds  Consultants:neurology  Code Status: FULL   DVT Prophylaxis: [place] Heparin   Procedures: As Listed in Progress Note Above  Antibiotics: None  Subjective: Pt without any specific complaints.   Objective: Vitals:   04/18/18 0500 04/18/18 1431 04/18/18 2210 04/19/18 0617  BP: 136/82 108/74 117/71 (!) 147/85  Pulse: (!) 56 87 70 (!) 57  Resp: 16 16 15    Temp: 98 F (36.7 C) 98.4 F (36.9 C) 98.3 F (36.8 C) 97.8 F (36.6 C)  TempSrc: Oral Oral Oral Oral  SpO2: 100% 100% 100% 100%  Weight:      Height:        Intake/Output Summary (Last 24 hours) at 04/19/2018 0904 Last data filed at 04/18/2018 1700 Gross per 24 hour  Intake 720 ml  Output -  Net  720 ml   [employer]  03/24/18 1113 04/09/18 1452 04/17/18 1512  Weight: 62.8 kg 65.3 kg 66.2 kg   Examination:  General exam: Alert, awake, no distress. Poor short term memory recall unchanged. Respiratory system: Clear to auscultation. Respiratory effort normal. Cardiovascular system:normal s1,s2 sounds. No murmurs, rubs, gallops. Gastrointestinal system: Abdomen is nondistended, soft and nontender. No organomegaly or masses felt. Normal bowel sounds heard.  Data Reviewed: I have personally reviewed following labs and imaging studies  CBC: No results for input(s): WBC, NEUTROABS, HGB, HCT, MCV, PLT in the last 168 hours. Basic Metabolic Panel: No results for input(s): NA, K, CL, CO2, GLUCOSE, BUN, CREATININE, CALCIUM, MG, PHOS in the last 168 hours. GFR: CrCl cannot be calculated (Patient's most recent lab result is older than the maximum 21 days allowed.). Liver Function Tests: No results for input(s): AST, ALT, ALKPHOS, BILITOT, PROT, ALBUMIN in the last 168 hours. No results for input(s): LIPASE, AMYLASE in the last 168 hours. No results for input(s): AMMONIA in the last 168 hours. Coagulation Profile: No results for input(s): INR, PROTIME in the last 168 hours. Cardiac Enzymes: No results for input(s): CKTOTAL, CKMB, CKMBINDEX, TROPONINI in the last 168 hours. BNP (last 3 results) No results for input(s): PROBNP in the last 8760 hours. HbA1C: No results for input(s): HGBA1C in the last 72 hours. CBG: No results for input(s): GLUCAP in the last 168 hours. Lipid Profile: No results for input(s): CHOL, HDL, LDLCALC, TRIG, CHOLHDL, LDLDIRECT in the last 72 hours. Thyroid Function Tests: No results for input(s): TSH, T4TOTAL, FREET4, T3FREE, THYROIDAB in the last 72 hours. Anemia Panel: No results for input(s): VITAMINB12, FOLATE, FERRITIN, TIBC, IRON, RETICCTPCT in the last 72 hours. Sepsis Labs: No results for input(s): PROCALCITON, LATICACIDVEN in the last 168  hours.  No results found for this or any previous visit (from the past 240 hour(s)).  Radiology Studies: No results found.  Scheduled Meds: . amLODipine  5 mg Oral Daily  . aspirin  325 mg Oral Daily  . feeding supplement (ENSURE ENLIVE)  237 mL Oral BID BM  . folic acid  1 mg Oral Daily  . heparin  5,000 Units Subcutaneous Q8H  . metoprolol tartrate  12.5 mg Oral BID  . multivitamin with minerals  1 tablet Oral Daily  . pantoprazole  40 mg Oral Q0600  . thiamine  100 mg Oral Daily   Continuous Infusions:   LOS: 33 days   Time spent: 15 minutes  Standley Dakins, MD [facility] Pager [phone]  If 7PM-7AM, please contact night-coverage [email or web address] Password [email or web address] 04/19/2018, 9:04 AM

## 2018-04-20 NOTE — Progress Notes (Signed)
PROGRESS NOTE    Adam Koch  ZOX:[id number] DOB: 1956/02/03 DOA: 03/17/2018 PCP: Patient, No Pcp Per   Brief Narrative:   62 year old male with a history of alcohol abuse, tobacco use, hypertension presented with altered mental status. Patient is unable to provide any history secondary to his encephalopathy. Apparently, the patient was agitated and confused on the afternoon of 03/17/2018. According to his girlfriend, the patient walked outside into the backyard in his underwear and began pulling out flowers. Because of his agitation, she called the police who arrived and recommended EMS to bring the patient to the hospital. According to the patient's girlfriend, the patient was "acting drunk"on 03/15/2018.She states that she last saw him normal on 03/14/2018. She states that he frequently goes out to drink alcohol after he gets paid, although she cannot elaborate how much and how often and the patient drinks. However, she states that he has drank alcohol regularly for nearly 20 years. The patient continues to smoke tobacco although the amount is unclear. The been no reports of chest pain, shortness breath, vomiting, diarrhea, abdominal pain, chest pain. In the emergency department, the patient was afebrile hemodynamically stable saturating 100% on room air.   Assessment/Plan:  Acute metabolic encephalopathy -Multifactorial including Wernicke's encephalopathy, stroke, and possibly hypertensive encephalopathyand THC -Serum B12--589 -TSH--1.094 -Urinalysis--neg for pyuria -Folic acid--11.5 -RPR--neg -Ammonia--21 -UDS--+THC -Thiamine 100mg  daily -He is still confused, but this is likely his baseline now.  Acute right MCA infarct -Neurology Consultappreciated-->30 day event monitor recommended.  Patient was monitored in the hospital on telemetry and did not have any significant events -PT/OT evaluation-->SNF which is being arranged by CSW.  -Speech therapy eval-->regular diet  with thin liquids and patient has been tolerating this well.  -CT brain--Moderate area of edema/acute to subacute infarct in the right parietal lobe without evidence for hemorrhage or midline shift -MRI brain--acute infarctR-MCA territorywith few other smaller foci in the same territory;questionable infarct in the inferior cerebellum on the left -MRA brain--motion degraded, no hemodynamically significant stenosis -Carotid Duplex--negative for hemodynamically significant stenosis -CTA H&N--no large vessel occlusion, stable large right MCA infarct, mild interval increase in edema with mild mass-effect around area of infarct. Petechial hemorrhage of right parietal lobe. Distal right MCA parietal branch occlusion. -Echo--EF 55-60%, no WMA, no PFO noted -LDL--68 -HbA1C--5.4 -Antiplatelet--ASA 325 mg -9/13--case discussed by Dr. Arbutus Leas with neurology, Dr. Lurene Shadow vasogenic edema to gradually improve; No need for mannitol  Essential hypertension -Blood pressure stable on amlodipine and metoprolol  Severe Protein malnutrition -continue Ensure  Disposition Plan:Patient is difficult to place.  Still waiting on bed offers.  DSS has guardianship.  Family Communication:none present during rounds  Consultants:neurology  Code Status: FULL   DVT Prophylaxis: [place] Heparin   Procedures: As Listed in Progress Note Above  Antibiotics: None  Subjective: Pt without any specific complaints.  Pt remains with loss of short term memory.    Objective: Vitals:   04/19/18 0617 04/19/18 1326 04/19/18 2235 04/20/18 0703  BP: (!) 147/85 119/81 126/68 (!) 152/81  Pulse: (!) 57 69 (!) 59 (!) 58  Resp:  16 19   Temp: 97.8 F (36.6 C) 98.3 F (36.8 C) 98.3 F (36.8 C) 97.8 F (36.6 C)  TempSrc: Oral Oral Oral Oral  SpO2: 100% 100% 100% 100%  Weight:      Height:        Intake/Output Summary (Last 24 hours) at 04/20/2018 0933 Last data filed at 04/19/2018  1801 Gross per 24 hour  Intake 480 ml  Output -  Net 480 ml   Filed Weights   03/24/18 1113 04/09/18 1452 04/17/18 1512  Weight: 62.8 kg 65.3 kg 66.2 kg   Examination:  General exam: Alert, awake, no distress. Poor short term memory recall unchanged. Respiratory system: Clear to auscultation. Respiratory effort normal. Cardiovascular system:normal s1,s2 sounds. No murmurs, rubs, gallops. Gastrointestinal system: Abdomen is nondistended, soft and nontender. No organomegaly or masses felt. Normal bowel sounds heard.  Data Reviewed: I have personally reviewed following labs and imaging studies  CBC: No results for input(s): WBC, NEUTROABS, HGB, HCT, MCV, PLT in the last 168 hours. Basic Metabolic Panel: No results for input(s): NA, K, CL, CO2, GLUCOSE, BUN, CREATININE, CALCIUM, MG, PHOS in the last 168 hours. GFR: CrCl cannot be calculated (Patient's most recent lab result is older than the maximum 21 days allowed.). Liver Function Tests: No results for input(s): AST, ALT, ALKPHOS, BILITOT, PROT, ALBUMIN in the last 168 hours. No results for input(s): LIPASE, AMYLASE in the last 168 hours. No results for input(s): AMMONIA in the last 168 hours. Coagulation Profile: No results for input(s): INR, PROTIME in the last 168 hours. Cardiac Enzymes: No results for input(s): CKTOTAL, CKMB, CKMBINDEX, TROPONINI in the last 168 hours. BNP (last 3 results) No results for input(s): PROBNP in the last 8760 hours. HbA1C: No results for input(s): HGBA1C in the last 72 hours. CBG: No results for input(s): GLUCAP in the last 168 hours. Lipid Profile: No results for input(s): CHOL, HDL, LDLCALC, TRIG, CHOLHDL, LDLDIRECT in the last 72 hours. Thyroid Function Tests: No results for input(s): TSH, T4TOTAL, FREET4, T3FREE, THYROIDAB in the last 72 hours. Anemia Panel: No results for input(s): VITAMINB12, FOLATE, FERRITIN, TIBC, IRON, RETICCTPCT in the last 72 hours. Sepsis Labs: No results for  input(s): PROCALCITON, LATICACIDVEN in the last 168 hours.  No results found for this or any previous visit (from the past 240 hour(s)).  Radiology Studies: No results found.  Scheduled Meds: . amLODipine  5 mg Oral Daily  . aspirin  325 mg Oral Daily  . feeding supplement (ENSURE ENLIVE)  237 mL Oral BID BM  . folic acid  1 mg Oral Daily  . heparin  5,000 Units Subcutaneous Q8H  . metoprolol tartrate  12.5 mg Oral BID  . multivitamin with minerals  1 tablet Oral Daily  . pantoprazole  40 mg Oral Q0600  . thiamine  100 mg Oral Daily   Continuous Infusions:   LOS: 34 days   Time spent: 13 minutes  Standley Dakins, MD [facility] Pager [phone]  If 7PM-7AM, please contact night-coverage [email or web address] Password [facility] 04/20/2018, 9:33 AM

## 2018-04-21 MED ORDER — ENSURE ENLIVE PO LIQD
237.0000 mL | ORAL | Status: DC
  Administered 2018-04-22 – 2018-04-29 (×6): 237 mL via ORAL

## 2018-04-21 NOTE — Clinical Social Work Note (Signed)
Adam Koch states that [name] was appointed guardian of person this morning.  She stated that she has contacted [name] who completed his Medicaid application, however she has not heard back from her. She stated that it can take up to 90 days. [name] stated that she would follow up. LCSW inquired about any way to expedite the process and [name] stated that she did not think the process could be expedited because it was a Medicaid and Disability application. [name] said she would check to see if it could be expedited in anyway. LCSW discussed that facilities were declining patient due to him not having a payor source and having experienced the lengthy process in the past.   [name], [name], LCSW

## 2018-04-21 NOTE — Progress Notes (Signed)
PROGRESS NOTE   Adam Koch  WUJ:[id number] DOB: 02-Nov-1955 DOA: 03/17/2018 PCP: Patient, No Pcp Per   Brief Narrative:   62 year old male with a history of alcohol abuse, tobacco use, hypertension presented with altered mental status. Patient is unable to provide any history secondary to his encephalopathy. Apparently, the patient was agitated and confused on the afternoon of 03/17/2018. According to his girlfriend, the patient walked outside into the backyard in his underwear and began pulling out flowers. Because of his agitation, she called the police who arrived and recommended EMS to bring the patient to the hospital. According to the patient's girlfriend, the patient was "acting drunk"on 03/15/2018.She states that she last saw him normal on 03/14/2018. She states that he frequently goes out to drink alcohol after he gets paid, although she cannot elaborate how much and how often and the patient drinks. However, she states that he has drank alcohol regularly for nearly 20 years. The patient continues to smoke tobacco although the amount is unclear. The been no reports of chest pain, shortness breath, vomiting, diarrhea, abdominal pain, chest pain. In the emergency department, the patient was afebrile hemodynamically stable saturating 100% on room air.   Assessment/Plan:  Acute metabolic encephalopathy -Multifactorial including Wernicke's encephalopathy, stroke, and possibly hypertensive encephalopathyand THC -Serum B12--589 -TSH--1.094 -Urinalysis--neg for pyuria -Folic acid--11.5 -RPR--neg -Ammonia--21 -UDS--+THC -Thiamine 100mg  daily -He remains pleasantly confused and this is his baseline now.  Acute right MCA infarct -Neurology Consultappreciated-->30 day event monitor recommended.  Patient was monitored in the hospital on telemetry and did not have any significant events -PT/OT evaluation-->SNF which is being arranged by CSW.  -Speech therapy eval-->regular diet  with thin liquids and patient has been tolerating this well.  -CT brain--Moderate area of edema/acute to subacute infarct in the right parietal lobe without evidence for hemorrhage or midline shift -MRI brain--acute infarctR-MCA territorywith few other smaller foci in the same territory;questionable infarct in the inferior cerebellum on the left -MRA brain--motion degraded, no hemodynamically significant stenosis -Carotid Duplex--negative for hemodynamically significant stenosis -CTA H&N--no large vessel occlusion, stable large right MCA infarct, mild interval increase in edema with mild mass-effect around area of infarct. Petechial hemorrhage of right parietal lobe. Distal right MCA parietal branch occlusion. -Echo--EF 55-60%, no WMA, no PFO noted -LDL--68 -HbA1C--5.4 -Antiplatelet--ASA 325 mg -9/13--case discussed by Dr. Arbutus Leas with neurology, Dr. Lurene Shadow vasogenic edema to gradually improve; No need for mannitol  Essential hypertension -Blood pressure stable on amlodipine and metoprolol  Severe Protein malnutrition -continue Ensure for meal supplementation  Disposition Plan:Patient is difficult to place.  Still waiting on bed offers.  DSS has guardianship.  Family Communication:none present during rounds  Consultants:neurology  Code Status: FULL   DVT Prophylaxis: [place] Heparin   Procedures: As Listed in Progress Note Above  Antibiotics: None  Subjective: Pt without any specific complaints.  Pt remains with loss of short term memory.  No acute changes.   Objective: Vitals:   04/20/18 1456 04/20/18 1955 04/20/18 2120 04/21/18 0537  BP: 98/73  130/77 119/69  Pulse: 71  68 60  Resp: 16     Temp: 98.6 F (37 C)  98.6 F (37 C) 97.6 F (36.4 C)  TempSrc: Oral  Oral Oral  SpO2: 100% 98% 100% 98%  Weight:      Height:        Intake/Output Summary (Last 24 hours) at 04/21/2018 0912 Last data filed at 04/20/2018 1829 Gross per 24  hour  Intake 960 ml  Output -  Net  960 ml   Filed Weights   03/24/18 1113 04/09/18 1452 04/17/18 1512  Weight: 62.8 kg 65.3 kg 66.2 kg   Examination:  General exam: Alert, awake, no distress. Poor short term memory recall unchanged. Respiratory system: Clear to auscultation. Respiratory effort normal. Cardiovascular system:normal s1,s2 sounds. No murmurs, rubs, gallops. Gastrointestinal system: Abdomen is nondistended, soft and nontender. No organomegaly or masses felt. Normal bowel sounds heard.  Data Reviewed: I have personally reviewed following labs and imaging studies  CBC: No results for input(s): WBC, NEUTROABS, HGB, HCT, MCV, PLT in the last 168 hours. Basic Metabolic Panel: No results for input(s): NA, K, CL, CO2, GLUCOSE, BUN, CREATININE, CALCIUM, MG, PHOS in the last 168 hours. GFR: CrCl cannot be calculated (Patient's most recent lab result is older than the maximum 21 days allowed.). Liver Function Tests: No results for input(s): AST, ALT, ALKPHOS, BILITOT, PROT, ALBUMIN in the last 168 hours. No results for input(s): LIPASE, AMYLASE in the last 168 hours. No results for input(s): AMMONIA in the last 168 hours. Coagulation Profile: No results for input(s): INR, PROTIME in the last 168 hours. Cardiac Enzymes: No results for input(s): CKTOTAL, CKMB, CKMBINDEX, TROPONINI in the last 168 hours. BNP (last 3 results) No results for input(s): PROBNP in the last 8760 hours. HbA1C: No results for input(s): HGBA1C in the last 72 hours. CBG: No results for input(s): GLUCAP in the last 168 hours. Lipid Profile: No results for input(s): CHOL, HDL, LDLCALC, TRIG, CHOLHDL, LDLDIRECT in the last 72 hours. Thyroid Function Tests: No results for input(s): TSH, T4TOTAL, FREET4, T3FREE, THYROIDAB in the last 72 hours. Anemia Panel: No results for input(s): VITAMINB12, FOLATE, FERRITIN, TIBC, IRON, RETICCTPCT in the last 72 hours. Sepsis Labs: No results for input(s):  PROCALCITON, LATICACIDVEN in the last 168 hours.  No results found for this or any previous visit (from the past 240 hour(s)).  Radiology Studies: No results found.  Scheduled Meds: . amLODipine  5 mg Oral Daily  . aspirin  325 mg Oral Daily  . feeding supplement (ENSURE ENLIVE)  237 mL Oral BID BM  . folic acid  1 mg Oral Daily  . heparin  5,000 Units Subcutaneous Q8H  . metoprolol tartrate  12.5 mg Oral BID  . multivitamin with minerals  1 tablet Oral Daily  . pantoprazole  40 mg Oral Q0600  . thiamine  100 mg Oral Daily    LOS: 35 days   Time spent: 12 minutes  Standley Dakins, MD [facility] Pager [phone]  If 7PM-7AM, please contact night-coverage [email or web address] Password [email or web address] 04/21/2018, 9:12 AM

## 2018-04-22 NOTE — Progress Notes (Signed)
PROGRESS NOTE   Adam Koch  ZOX:[id number] DOB: 10-09-1955 DOA: 03/17/2018 PCP: Patient, No Pcp Per   Brief Narrative:   62 year old male with a history of alcohol abuse, tobacco use, hypertension presented with altered mental status. Patient is unable to provide any history secondary to his encephalopathy. Apparently, the patient was agitated and confused on the afternoon of 03/17/2018. According to his girlfriend, the patient walked outside into the backyard in his underwear and began pulling out flowers. Because of his agitation, she called the police who arrived and recommended EMS to bring the patient to the hospital. According to the patient's girlfriend, the patient was "acting drunk"on 03/15/2018.She states that she last saw him normal on 03/14/2018. She states that he frequently goes out to drink alcohol after he gets paid, although she cannot elaborate how much and how often and the patient drinks. However, she states that he has drank alcohol regularly for nearly 20 years. The patient continues to smoke tobacco although the amount is unclear. The been no reports of chest pain, shortness breath, vomiting, diarrhea, abdominal pain, chest pain. In the emergency department, the patient was afebrile hemodynamically stable saturating 100% on room air.   Assessment/Plan:  Acute metabolic encephalopathy -Multifactorial including Wernicke's encephalopathy, stroke, and possibly hypertensive encephalopathyand THC -Serum B12--589 -TSH--1.094 -Urinalysis--neg for pyuria -Folic acid--11.5 -RPR--neg -Ammonia--21 -UDS--+THC -Thiamine 100mg  daily -He remains pleasantly confused and this is his baseline now.  Acute right MCA infarct -Neurology Consultappreciated-->30 day event monitor recommended.  Patient was monitored in the hospital on telemetry and did not have any significant events -PT/OT evaluation-->SNF which is being arranged by CSW.  -Speech therapy eval-->regular diet  with thin liquids and patient has been tolerating this well.  -CT brain--Moderate area of edema/acute to subacute infarct in the right parietal lobe without evidence for hemorrhage or midline shift -MRI brain--acute infarctR-MCA territorywith few other smaller foci in the same territory;questionable infarct in the inferior cerebellum on the left -MRA brain--motion degraded, no hemodynamically significant stenosis -Carotid Duplex--negative for hemodynamically significant stenosis -CTA H&N--no large vessel occlusion, stable large right MCA infarct, mild interval increase in edema with mild mass-effect around area of infarct. Petechial hemorrhage of right parietal lobe. Distal right MCA parietal branch occlusion. -Echo--EF 55-60%, no WMA, no PFO noted -LDL--68 -HbA1C--5.4 -Antiplatelet--ASA 325 mg -9/13--case discussed by Dr. Arbutus Leas with neurology, Dr. Lurene Shadow vasogenic edema to gradually improve; No need for mannitol  Essential hypertension -Blood pressure stable on amlodipine and metoprolol  Severe Protein malnutrition -continue Ensure for meal supplementation  Disposition Plan:Patient is difficult to place.  Still waiting on bed offers.  DSS has guardianship.  Family Communication:none present during rounds  Consultants:neurology  Code Status: FULL   DVT Prophylaxis: [place] Heparin   Procedures: As Listed in Progress Note Above  Antibiotics: None  Subjective: Pt without any specific complaints.  Pt remains with loss of short term memory.  No acute changes.  Pt would like to ambulate in the halls.   Objective: Vitals:   04/21/18 1450 04/21/18 2130 04/22/18 0538 04/22/18 0823  BP: 110/76 (!) 144/84 (!) 164/88 (!) 141/77  Pulse: 67 66 62 64  Resp: 16     Temp: 98.6 F (37 C) 97.6 F (36.4 C) 98.1 F (36.7 C)   TempSrc: Oral Oral Oral   SpO2: 100% 100% 100%   Weight:      Height:        Intake/Output Summary (Last 24 hours) at  04/22/2018 1238 Last data filed at 04/22/2018 0900 Gross  per 24 hour  Intake 840 ml  Output -  Net 840 ml   Filed Weights   03/24/18 1113 04/09/18 1452 04/17/18 1512  Weight: 62.8 kg 65.3 kg 66.2 kg   Examination:  General exam: Alert, awake, no distress. Poor short term memory recall unchanged. Respiratory system: Clear to auscultation. Respiratory effort normal. Cardiovascular system:normal s1,s2 sounds. No murmurs, rubs, gallops. Gastrointestinal system: Abdomen is nondistended, soft and nontender. No organomegaly or masses felt. Normal bowel sounds heard.  Data Reviewed: I have personally reviewed following labs and imaging studies  CBC: No results for input(s): WBC, NEUTROABS, HGB, HCT, MCV, PLT in the last 168 hours. Basic Metabolic Panel: No results for input(s): NA, K, CL, CO2, GLUCOSE, BUN, CREATININE, CALCIUM, MG, PHOS in the last 168 hours. GFR: CrCl cannot be calculated (Patient's most recent lab result is older than the maximum 21 days allowed.). Liver Function Tests: No results for input(s): AST, ALT, ALKPHOS, BILITOT, PROT, ALBUMIN in the last 168 hours. No results for input(s): LIPASE, AMYLASE in the last 168 hours. No results for input(s): AMMONIA in the last 168 hours. Coagulation Profile: No results for input(s): INR, PROTIME in the last 168 hours. Cardiac Enzymes: No results for input(s): CKTOTAL, CKMB, CKMBINDEX, TROPONINI in the last 168 hours. BNP (last 3 results) No results for input(s): PROBNP in the last 8760 hours. HbA1C: No results for input(s): HGBA1C in the last 72 hours. CBG: No results for input(s): GLUCAP in the last 168 hours. Lipid Profile: No results for input(s): CHOL, HDL, LDLCALC, TRIG, CHOLHDL, LDLDIRECT in the last 72 hours. Thyroid Function Tests: No results for input(s): TSH, T4TOTAL, FREET4, T3FREE, THYROIDAB in the last 72 hours. Anemia Panel: No results for input(s): VITAMINB12, FOLATE, FERRITIN, TIBC, IRON, RETICCTPCT in the  last 72 hours. Sepsis Labs: No results for input(s): PROCALCITON, LATICACIDVEN in the last 168 hours.  No results found for this or any previous visit (from the past 240 hour(s)).  Radiology Studies: No results found.  Scheduled Meds: . amLODipine  5 mg Oral Daily  . aspirin  325 mg Oral Daily  . feeding supplement (ENSURE ENLIVE)  237 mL Oral Q24H  . folic acid  1 mg Oral Daily  . heparin  5,000 Units Subcutaneous Q8H  . metoprolol tartrate  12.5 mg Oral BID  . multivitamin with minerals  1 tablet Oral Daily  . pantoprazole  40 mg Oral Q0600  . thiamine  100 mg Oral Daily    LOS: 36 days   Time spent: 14 minutes  Standley Dakins, MD [facility] Pager [phone]  If 7PM-7AM, please contact night-coverage [email or web address] Password [facility] 04/22/2018, 12:38 PM

## 2018-04-23 MED ORDER — ENOXAPARIN SODIUM 40 MG/0.4ML ~~LOC~~ SOLN
40.0000 mg | SUBCUTANEOUS | Status: DC
  Administered 2018-04-23 – 2018-04-25 (×3): 40 mg via SUBCUTANEOUS
  Filled 2018-04-23 (×3): qty 0.4

## 2018-04-23 NOTE — Progress Notes (Signed)
PROGRESS NOTE   Adam Koch  ZOX:[id number] DOB: 08-Jul-1956 DOA: 03/17/2018 PCP: Patient, No Pcp Per   Brief Narrative:   62 year old male with a history of alcohol abuse, tobacco use, hypertension presented with altered mental status. Patient is unable to provide any history secondary to his encephalopathy. Apparently, the patient was agitated and confused on the afternoon of 03/17/2018. According to his girlfriend, the patient walked outside into the backyard in his underwear and began pulling out flowers. Because of his agitation, she called the police who arrived and recommended EMS to bring the patient to the hospital. According to the patient's girlfriend, the patient was "acting drunk"on 03/15/2018.She states that she last saw him normal on 03/14/2018. She states that he frequently goes out to drink alcohol after he gets paid, although she cannot elaborate how much and how often and the patient drinks. However, she states that he has drank alcohol regularly for nearly 20 years. The patient continues to smoke tobacco although the amount is unclear. The been no reports of chest pain, shortness breath, vomiting, diarrhea, abdominal pain, chest pain. In the emergency department, the patient was afebrile hemodynamically stable saturating 100% on room air.   Assessment/Plan:  Acute metabolic encephalopathy -Multifactorial including Wernicke's encephalopathy, stroke, and possibly hypertensive encephalopathyand THC -Serum B12--589 -TSH--1.094 -Urinalysis--neg for pyuria -Folic acid--11.5 -RPR--neg -Ammonia--21 -UDS--+THC -Thiamine 100mg  daily -He remains pleasantly confused and this is his baseline now.  Acute right MCA infarct -Neurology Consultappreciated-->30 day event monitor recommended.  Patient was monitored in the hospital on telemetry and did not have any significant events -PT/OT evaluation-->SNF which is being arranged by CSW.  -Speech therapy eval-->regular diet  with thin liquids and patient has been tolerating this well.  -CT brain--Moderate area of edema/acute to subacute infarct in the right parietal lobe without evidence for hemorrhage or midline shift -MRI brain--acute infarctR-MCA territorywith few other smaller foci in the same territory;questionable infarct in the inferior cerebellum on the left -MRA brain--motion degraded, no hemodynamically significant stenosis -Carotid Duplex--negative for hemodynamically significant stenosis -CTA H&N--no large vessel occlusion, stable large right MCA infarct, mild interval increase in edema with mild mass-effect around area of infarct. Petechial hemorrhage of right parietal lobe. Distal right MCA parietal branch occlusion. -Echo--EF 55-60%, no WMA, no PFO noted -LDL--68 -HbA1C--5.4 -Antiplatelet--ASA 325 mg -9/13--case discussed by Dr. Arbutus Leas with neurology, Dr. Lurene Shadow vasogenic edema to gradually improve; No need for mannitol  Essential hypertension -Blood pressure stable on amlodipine and metoprolol  Severe Protein malnutrition -continue Ensure for meal supplementation  Disposition Plan:Patient is difficult to place.  Still waiting on bed offers.  DSS has guardianship.  Family Communication:none present during rounds  Consultants:neurology  Code Status: FULL   DVT Prophylaxis: [place] Heparin   Procedures: As Listed in Progress Note Above  Antibiotics: None  Subjective: Pt without any specific complaints.  Pt remains with loss of short term memory.  No acute changes.  Pt would like to ambulate in the halls.  He is getting "cabin fever."   Objective: Vitals:   04/22/18 1307 04/22/18 2151 04/23/18 0615 04/23/18 0854  BP: 128/85 139/81 135/90 (!) 162/91  Pulse: 71 65 (!) 57 65  Resp: 16 20    Temp: 98.6 F (37 C) 98.4 F (36.9 C) 97.6 F (36.4 C)   TempSrc: Oral Oral Oral   SpO2: 100% 100% 99%   Weight:      Height:        Intake/Output  Summary (Last 24 hours) at 04/23/2018 1119 Last data  filed at 04/23/2018 0900 Gross per 24 hour  Intake 720 ml  Output -  Net 720 ml   Filed Weights   03/24/18 1113 04/09/18 1452 04/17/18 1512  Weight: 62.8 kg 65.3 kg 66.2 kg   Examination:  General exam: Alert, awake, no distress. Poor short term memory recall unchanged. Respiratory system: Clear to auscultation. Respiratory effort normal. Cardiovascular system:normal s1,s2 sounds. No murmurs, rubs, gallops. Gastrointestinal system: Abdomen is nondistended, soft and nontender. No organomegaly or masses felt. Normal bowel sounds heard.  Data Reviewed: I have personally reviewed following labs and imaging studies  CBC: No results for input(s): WBC, NEUTROABS, HGB, HCT, MCV, PLT in the last 168 hours. Basic Metabolic Panel: No results for input(s): NA, K, CL, CO2, GLUCOSE, BUN, CREATININE, CALCIUM, MG, PHOS in the last 168 hours. GFR: CrCl cannot be calculated (Patient's most recent lab result is older than the maximum 21 days allowed.). Liver Function Tests: No results for input(s): AST, ALT, ALKPHOS, BILITOT, PROT, ALBUMIN in the last 168 hours. No results for input(s): LIPASE, AMYLASE in the last 168 hours. No results for input(s): AMMONIA in the last 168 hours. Coagulation Profile: No results for input(s): INR, PROTIME in the last 168 hours. Cardiac Enzymes: No results for input(s): CKTOTAL, CKMB, CKMBINDEX, TROPONINI in the last 168 hours. BNP (last 3 results) No results for input(s): PROBNP in the last 8760 hours. HbA1C: No results for input(s): HGBA1C in the last 72 hours. CBG: No results for input(s): GLUCAP in the last 168 hours. Lipid Profile: No results for input(s): CHOL, HDL, LDLCALC, TRIG, CHOLHDL, LDLDIRECT in the last 72 hours. Thyroid Function Tests: No results for input(s): TSH, T4TOTAL, FREET4, T3FREE, THYROIDAB in the last 72 hours. Anemia Panel: No results for input(s): VITAMINB12, FOLATE, FERRITIN,  TIBC, IRON, RETICCTPCT in the last 72 hours. Sepsis Labs: No results for input(s): PROCALCITON, LATICACIDVEN in the last 168 hours.  No results found for this or any previous visit (from the past 240 hour(s)).  Radiology Studies: No results found.  Scheduled Meds: . amLODipine  5 mg Oral Daily  . aspirin  325 mg Oral Daily  . feeding supplement (ENSURE ENLIVE)  237 mL Oral Q24H  . folic acid  1 mg Oral Daily  . heparin  5,000 Units Subcutaneous Q8H  . metoprolol tartrate  12.5 mg Oral BID  . multivitamin with minerals  1 tablet Oral Daily  . pantoprazole  40 mg Oral Q0600  . thiamine  100 mg Oral Daily    LOS: 37 days   Time spent: 13 minutes  Standley Dakins, MD [facility] Pager [phone]  If 7PM-7AM, please contact night-coverage [email or web address] Password [email or web address] 04/23/2018, 11:19 AM

## 2018-04-24 LAB — CREATININE, SERUM
Creatinine, Ser: 0.9 mg/dL (ref 0.61–1.24)
GFR calc Af Amer: >60 mL/min (ref >60)

## 2018-04-24 NOTE — Progress Notes (Signed)
PROGRESS NOTE   Adam Koch  ZOX:[id number] DOB: [birth year] DOA: 03/17/2018 PCP: Patient, No Pcp Per   Brief Narrative:   62 year old male with a history of alcohol abuse, tobacco use, hypertension presented with altered mental status. Patient is unable to provide any history secondary to his encephalopathy. Apparently, the patient was agitated and confused on the afternoon of 03/17/2018. According to his girlfriend, the patient walked outside into the backyard in his underwear and began pulling out flowers. Because of his agitation, she called the police who arrived and recommended EMS to bring the patient to the hospital. According to the patient's girlfriend, the patient was "acting drunk"on 03/15/2018.She states that she last saw him normal on 03/14/2018. She states that he frequently goes out to drink alcohol after he gets paid, although she cannot elaborate how much and how often and the patient drinks. However, she states that he has drank alcohol regularly for nearly 20 years. The patient continues to smoke tobacco although the amount is unclear. The been no reports of chest pain, shortness breath, vomiting, diarrhea, abdominal pain, chest pain. In the emergency department, the patient was afebrile hemodynamically stable saturating 100% on room air.   Assessment/Plan: No change in overall plan:  Acute metabolic encephalopathy -Multifactorial including Wernicke's encephalopathy, stroke, and possibly hypertensive encephalopathyand THC -Serum B12--589 -TSH--1.094 -Urinalysis--neg for pyuria -Folic acid--11.5 -RPR--neg -Ammonia--21 -UDS--+THC -Thiamine 100mg  daily -He remains pleasantly confused and this is his baseline now.  Acute right MCA infarct -Neurology Consultappreciated-->30 day event monitor recommended.  Patient was monitored in the hospital on telemetry and did not have any significant events -PT/OT evaluation-->SNF which is being arranged by CSW.  -Speech  therapy eval-->regular diet with thin liquids and patient has been tolerating this well.  -CT brain--Moderate area of edema/acute to subacute infarct in the right parietal lobe without evidence for hemorrhage or midline shift -MRI brain--acute infarctR-MCA territorywith few other smaller foci in the same territory;questionable infarct in the inferior cerebellum on the left -MRA brain--motion degraded, no hemodynamically significant stenosis -Carotid Duplex--negative for hemodynamically significant stenosis -CTA H&N--no large vessel occlusion, stable large right MCA infarct, mild interval increase in edema with mild mass-effect around area of infarct. Petechial hemorrhage of right parietal lobe. Distal right MCA parietal branch occlusion. -Echo--EF 55-60%, no WMA, no PFO noted -LDL--68 -HbA1C--5.4 -Antiplatelet--ASA 325 mg -9/13--case discussed by Dr. Arbutus Leas with neurology, Dr. Lurene Shadow vasogenic edema to gradually improve; No need for mannitol  Essential hypertension -Blood pressure stable on amlodipine and metoprolol  Severe Protein malnutrition -continue Ensure for meal supplementation  Disposition Plan:Patient is difficult to place.  Still waiting on bed offers.  DSS has guardianship.  Family Communication:none present during rounds  Consultants:neurology  Code Status: FULL   DVT Prophylaxis: [place] Heparin   Procedures: As Listed in Progress Note Above  Antibiotics: None  Subjective: Denies any pain, nausea, vomiting, shortness of breath  Objective: Vitals:   04/23/18 0854 04/23/18 1318 04/23/18 2144 04/24/18 0511  BP: (!) 162/91 132/77 117/77 (!) 152/81  Pulse: 65 63 71 (!) 57  Resp:  18 16 16   Temp:  97.9 F (36.6 C) 98.5 F (36.9 C) 98.1 F (36.7 C)  TempSrc:   Oral Oral  SpO2:  100% 99% 100%  Weight:      Height:        Intake/Output Summary (Last 24 hours) at 04/24/2018 1405 Last data filed at 04/24/2018 1353 Gross per  24 hour  Intake 960 ml  Output -  Net 960 ml  Filed Weights   03/24/18 1113 04/09/18 1452 04/17/18 1512  Weight: 62.8 kg 65.3 kg 66.2 kg   Examination:  General exam: Alert, awake, no distress Respiratory system: Clear to auscultation. Respiratory effort normal. Cardiovascular system:RRR. No murmurs, rubs, gallops. Gastrointestinal system: Abdomen is nondistended, soft and nontender. No organomegaly or masses felt. Normal bowel sounds heard.  Data Reviewed: I have personally reviewed following labs and imaging studies  CBC: No results for input(s): WBC, NEUTROABS, HGB, HCT, MCV, PLT in the last 168 hours. Basic Metabolic Panel: Recent Labs  Lab 04/24/18 0538  CREATININE 0.90   GFR: Estimated Creatinine Clearance: 80.7 mL/min (by C-G formula based on SCr of 0.9 mg/dL). Liver Function Tests: No results for input(s): AST, ALT, ALKPHOS, BILITOT, PROT, ALBUMIN in the last 168 hours. No results for input(s): LIPASE, AMYLASE in the last 168 hours. No results for input(s): AMMONIA in the last 168 hours. Coagulation Profile: No results for input(s): INR, PROTIME in the last 168 hours. Cardiac Enzymes: No results for input(s): CKTOTAL, CKMB, CKMBINDEX, TROPONINI in the last 168 hours. BNP (last 3 results) No results for input(s): PROBNP in the last 8760 hours. HbA1C: No results for input(s): HGBA1C in the last 72 hours. CBG: No results for input(s): GLUCAP in the last 168 hours. Lipid Profile: No results for input(s): CHOL, HDL, LDLCALC, TRIG, CHOLHDL, LDLDIRECT in the last 72 hours. Thyroid Function Tests: No results for input(s): TSH, T4TOTAL, FREET4, T3FREE, THYROIDAB in the last 72 hours. Anemia Panel: No results for input(s): VITAMINB12, FOLATE, FERRITIN, TIBC, IRON, RETICCTPCT in the last 72 hours. Sepsis Labs: No results for input(s): PROCALCITON, LATICACIDVEN in the last 168 hours.  No results found for this or any previous visit (from the past 240 hour(s)).    Radiology Studies: No results found.  Scheduled Meds: . amLODipine  5 mg Oral Daily  . aspirin  325 mg Oral Daily  . enoxaparin (LOVENOX) injection  40 mg Subcutaneous Q24H  . feeding supplement (ENSURE ENLIVE)  237 mL Oral Q24H  . folic acid  1 mg Oral Daily  . metoprolol tartrate  12.5 mg Oral BID  . multivitamin with minerals  1 tablet Oral Daily  . pantoprazole  40 mg Oral Q0600  . thiamine  100 mg Oral Daily    LOS: 38 days   Time spent: 15 minutes  Erick Blinks, MD [facility] Pager [phone]  If 7PM-7AM, please contact night-coverage [email or web address] Password [email or web address] 04/24/2018, 2:05 PM

## 2018-04-25 NOTE — Progress Notes (Signed)
PROGRESS NOTE   Adam Koch  ZOX:[id number] DOB: 12-14-1955 DOA: 03/17/2018 PCP: Patient, No Pcp Per   Brief Narrative:   62 year old male with a history of alcohol abuse, tobacco use, hypertension presented with altered mental status. Patient is unable to provide any history secondary to his encephalopathy. Apparently, the patient was agitated and confused on the afternoon of 03/17/2018. According to his girlfriend, the patient walked outside into the backyard in his underwear and began pulling out flowers. Because of his agitation, she called the police who arrived and recommended EMS to bring the patient to the hospital. According to the patient's girlfriend, the patient was "acting drunk"on 03/15/2018.She states that she last saw him normal on 03/14/2018. She states that he frequently goes out to drink alcohol after he gets paid, although she cannot elaborate how much and how often and the patient drinks. However, she states that he has drank alcohol regularly for nearly 20 years. The patient continues to smoke tobacco although the amount is unclear. The been no reports of chest pain, shortness breath, vomiting, diarrhea, abdominal pain, chest pain. In the emergency department, the patient was afebrile hemodynamically stable saturating 100% on room air.   Assessment/Plan: No change in overall plan.  Still awaiting placement:  Acute metabolic encephalopathy -Multifactorial including Wernicke's encephalopathy, stroke, and possibly hypertensive encephalopathyand THC -Serum B12--589 -TSH--1.094 -Urinalysis--neg for pyuria -Folic acid--11.5 -RPR--neg -Ammonia--21 -UDS--+THC -Thiamine 100mg  daily -He remains pleasantly confused and this is his baseline now.  Acute right MCA infarct -Neurology Consultappreciated-->30 day event monitor recommended.  Patient was monitored in the hospital on telemetry and did not have any significant events -PT/OT evaluation-->SNF which is being  arranged by CSW.  -Speech therapy eval-->regular diet with thin liquids and patient has been tolerating this well.  -CT brain--Moderate area of edema/acute to subacute infarct in the right parietal lobe without evidence for hemorrhage or midline shift -MRI brain--acute infarctR-MCA territorywith few other smaller foci in the same territory;questionable infarct in the inferior cerebellum on the left -MRA brain--motion degraded, no hemodynamically significant stenosis -Carotid Duplex--negative for hemodynamically significant stenosis -CTA H&N--no large vessel occlusion, stable large right MCA infarct, mild interval increase in edema with mild mass-effect around area of infarct. Petechial hemorrhage of right parietal lobe. Distal right MCA parietal branch occlusion. -Echo--EF 55-60%, no WMA, no PFO noted -LDL--68 -HbA1C--5.4 -Antiplatelet--ASA 325 mg -9/13--case discussed by Dr. Arbutus Leas with neurology, Dr. Lurene Shadow vasogenic edema to gradually improve; No need for mannitol  Essential hypertension -Blood pressure stable on amlodipine and metoprolol  Severe Protein malnutrition -continue Ensure for meal supplementation  Disposition Plan:Patient is difficult to place.  Still waiting on bed offers.  DSS has guardianship.  Family Communication:none present during rounds  Consultants:neurology  Code Status: FULL   DVT Prophylaxis: [place] Heparin   Procedures: As Listed in Progress Note Above  Antibiotics: None  Subjective: No new complaints.  No pain or shortness of breath.  Objective: Vitals:   04/24/18 2138 04/25/18 0637 04/25/18 0940 04/25/18 1407  BP: (!) 144/81 (!) 166/83  122/76  Pulse: 67 (!) 58  61  Resp: 18 18  18   Temp: (!) 97.5 F (36.4 C) 98.3 F (36.8 C)  97.9 F (36.6 C)  TempSrc: Oral Oral  Oral  SpO2: 100% 100% 100% 100%  Weight:      Height:        Intake/Output Summary (Last 24 hours) at 04/25/2018 1807 Last data filed  at 04/25/2018 1313 Gross per 24 hour  Intake 720 ml  Output -  Net 720 ml   Filed Weights   03/24/18 1113 04/09/18 1452 04/17/18 1512  Weight: 62.8 kg 65.3 kg 66.2 kg   Examination:  General exam: Alert, awake, no distress Respiratory system: Clear to auscultation. Respiratory effort normal. Cardiovascular system:RRR. No murmurs, rubs, gallops. Gastrointestinal system: Abdomen is nondistended, soft and nontender. No organomegaly or masses felt. Normal bowel sounds heard.   Data Reviewed: I have personally reviewed following labs and imaging studies  CBC: No results for input(s): WBC, NEUTROABS, HGB, HCT, MCV, PLT in the last 168 hours. Basic Metabolic Panel: Recent Labs  Lab 04/24/18 0538  CREATININE 0.90   GFR: Estimated Creatinine Clearance: 80.7 mL/min (by C-G formula based on SCr of 0.9 mg/dL). Liver Function Tests: No results for input(s): AST, ALT, ALKPHOS, BILITOT, PROT, ALBUMIN in the last 168 hours. No results for input(s): LIPASE, AMYLASE in the last 168 hours. No results for input(s): AMMONIA in the last 168 hours. Coagulation Profile: No results for input(s): INR, PROTIME in the last 168 hours. Cardiac Enzymes: No results for input(s): CKTOTAL, CKMB, CKMBINDEX, TROPONINI in the last 168 hours. BNP (last 3 results) No results for input(s): PROBNP in the last 8760 hours. HbA1C: No results for input(s): HGBA1C in the last 72 hours. CBG: No results for input(s): GLUCAP in the last 168 hours. Lipid Profile: No results for input(s): CHOL, HDL, LDLCALC, TRIG, CHOLHDL, LDLDIRECT in the last 72 hours. Thyroid Function Tests: No results for input(s): TSH, T4TOTAL, FREET4, T3FREE, THYROIDAB in the last 72 hours. Anemia Panel: No results for input(s): VITAMINB12, FOLATE, FERRITIN, TIBC, IRON, RETICCTPCT in the last 72 hours. Sepsis Labs: No results for input(s): PROCALCITON, LATICACIDVEN in the last 168 hours.  No results found for this or any previous visit (from  the past 240 hour(s)).  Radiology Studies: No results found.  Scheduled Meds: . amLODipine  5 mg Oral Daily  . aspirin  325 mg Oral Daily  . enoxaparin (LOVENOX) injection  40 mg Subcutaneous Q24H  . feeding supplement (ENSURE ENLIVE)  237 mL Oral Q24H  . folic acid  1 mg Oral Daily  . metoprolol tartrate  12.5 mg Oral BID  . multivitamin with minerals  1 tablet Oral Daily  . pantoprazole  40 mg Oral Q0600  . thiamine  100 mg Oral Daily    LOS: 39 days   Time spent: 15 minutes  Erick Blinks, MD [facility] Pager [phone]  If 7PM-7AM, please contact night-coverage [email or web address] Password [email or web address] 04/25/2018, 6:07 PM

## 2018-04-26 NOTE — Progress Notes (Signed)
Nosebleed noted, moderate in severity, bright red blood. Pt instructed to hold pressure at bridge of nose. Pt return demonstrated. Dr. Kerry Hough made aware, face-to-face. New order to discontinue Lovenox SQ since patient is ambulatory. Pt ambulates in hallway and room frequently. Will continue to monitor.

## 2018-04-26 NOTE — Progress Notes (Signed)
PROGRESS NOTE   Adam Koch  ZOX:[id number] DOB: 05/09/1956 DOA: 03/17/2018 PCP: Patient, No Pcp Per   Brief Narrative:   62 year old male with a history of alcohol abuse, tobacco use, hypertension presented with altered mental status. Patient is unable to provide any history secondary to his encephalopathy. Apparently, the patient was agitated and confused on the afternoon of 03/17/2018. According to his girlfriend, the patient walked outside into the backyard in his underwear and began pulling out flowers. Because of his agitation, she called the police who arrived and recommended EMS to bring the patient to the hospital. According to the patient's girlfriend, the patient was "acting drunk"on 03/15/2018.She states that she last saw him normal on 03/14/2018. She states that he frequently goes out to drink alcohol after he gets paid, although she cannot elaborate how much and how often and the patient drinks. However, she states that he has drank alcohol regularly for nearly 20 years. The patient continues to smoke tobacco although the amount is unclear. The been no reports of chest pain, shortness breath, vomiting, diarrhea, abdominal pain, chest pain. In the emergency department, the patient was afebrile hemodynamically stable saturating 100% on room air.   Assessment/Plan: No change in overall plan, still awaiting placement:  Acute metabolic encephalopathy -Multifactorial including Wernicke's encephalopathy, stroke, and possibly hypertensive encephalopathyand THC -Serum B12--589 -TSH--1.094 -Urinalysis--neg for pyuria -Folic acid--11.5 -RPR--neg -Ammonia--21 -UDS--+THC -Thiamine 100mg  daily -He remains pleasantly confused and this is his baseline now.  Acute right MCA infarct -Neurology Consultappreciated-->30 day event monitor recommended.  Patient was monitored in the hospital on telemetry and did not have any significant events -PT/OT evaluation-->SNF which is being  arranged by CSW.  -Speech therapy eval-->regular diet with thin liquids and patient has been tolerating this well.  -CT brain--Moderate area of edema/acute to subacute infarct in the right parietal lobe without evidence for hemorrhage or midline shift -MRI brain--acute infarctR-MCA territorywith few other smaller foci in the same territory;questionable infarct in the inferior cerebellum on the left -MRA brain--motion degraded, no hemodynamically significant stenosis -Carotid Duplex--negative for hemodynamically significant stenosis -CTA H&N--no large vessel occlusion, stable large right MCA infarct, mild interval increase in edema with mild mass-effect around area of infarct. Petechial hemorrhage of right parietal lobe. Distal right MCA parietal branch occlusion. -Echo--EF 55-60%, no WMA, no PFO noted -LDL--68 -HbA1C--5.4 -Antiplatelet--ASA 325 mg -9/13--case discussed by Dr. Arbutus Leas with neurology, Dr. Lurene Shadow vasogenic edema to gradually improve; No need for mannitol  Essential hypertension -Blood pressure stable on amlodipine and metoprolol  Severe Protein malnutrition -continue Ensure for meal supplementation  Epistaxis -unclear etiology, but self limited. lovenox for dvt prophylaxis discontinued. Continue to monitor.  Disposition Plan:Patient is difficult to place.  Still waiting on bed offers.  DSS has guardianship.  Family Communication:none present during rounds  Consultants:neurology  Code Status: FULL   DVT Prophylaxis: frequent ambulation  Procedures: As Listed in Progress Note Above  Antibiotics: None  Subjective: Patient noted by staff to have epistaxis today. This was self limited and had resolved at the time of my evaluation. No new complaints  Objective: Vitals:   04/25/18 2001 04/26/18 0633 04/26/18 1053 04/26/18 1628  BP: (!) 160/85 111/73  115/72  Pulse: 64 (!) 59  64  Resp: 18 18  18   Temp: 98.2 F (36.8 C) 98.6  F (37 C)  97.6 F (36.4 C)  TempSrc: Oral Oral  Oral  SpO2: 100% 100% 99% 100%  Weight:      Height:  Intake/Output Summary (Last 24 hours) at 04/26/2018 1741 Last data filed at 04/26/2018 1018 Gross per 24 hour  Intake 480 ml  Output -  Net 480 ml   Filed Weights   03/24/18 1113 04/09/18 1452 04/17/18 1512  Weight: 62.8 kg 65.3 kg 66.2 kg   Examination:  General exam: Alert, awake, no distress HEENT: no further bleeding noted from nares. Respiratory system: Clear to auscultation. Respiratory effort normal. Cardiovascular system:RRR. No murmurs, rubs, gallops. Gastrointestinal system: Abdomen is nondistended, soft and nontender. No organomegaly or masses felt. Normal bowel sounds heard.  Data Reviewed: I have personally reviewed following labs and imaging studies  CBC: No results for input(s): WBC, NEUTROABS, HGB, HCT, MCV, PLT in the last 168 hours. Basic Metabolic Panel: Recent Labs  Lab 04/24/18 0538  CREATININE 0.90   GFR: Estimated Creatinine Clearance: 80.7 mL/min (by C-G formula based on SCr of 0.9 mg/dL). Liver Function Tests: No results for input(s): AST, ALT, ALKPHOS, BILITOT, PROT, ALBUMIN in the last 168 hours. No results for input(s): LIPASE, AMYLASE in the last 168 hours. No results for input(s): AMMONIA in the last 168 hours. Coagulation Profile: No results for input(s): INR, PROTIME in the last 168 hours. Cardiac Enzymes: No results for input(s): CKTOTAL, CKMB, CKMBINDEX, TROPONINI in the last 168 hours. BNP (last 3 results) No results for input(s): PROBNP in the last 8760 hours. HbA1C: No results for input(s): HGBA1C in the last 72 hours. CBG: No results for input(s): GLUCAP in the last 168 hours. Lipid Profile: No results for input(s): CHOL, HDL, LDLCALC, TRIG, CHOLHDL, LDLDIRECT in the last 72 hours. Thyroid Function Tests: No results for input(s): TSH, T4TOTAL, FREET4, T3FREE, THYROIDAB in the last 72 hours. Anemia Panel: No  results for input(s): VITAMINB12, FOLATE, FERRITIN, TIBC, IRON, RETICCTPCT in the last 72 hours. Sepsis Labs: No results for input(s): PROCALCITON, LATICACIDVEN in the last 168 hours.  No results found for this or any previous visit (from the past 240 hour(s)).  Radiology Studies: No results found.  Scheduled Meds: . amLODipine  5 mg Oral Daily  . aspirin  325 mg Oral Daily  . feeding supplement (ENSURE ENLIVE)  237 mL Oral Q24H  . folic acid  1 mg Oral Daily  . metoprolol tartrate  12.5 mg Oral BID  . multivitamin with minerals  1 tablet Oral Daily  . pantoprazole  40 mg Oral Q0600  . thiamine  100 mg Oral Daily    LOS: 40 days   Time spent: 15 minutes  Erick Blinks, MD [facility] Pager [phone]  If 7PM-7AM, please contact night-coverage [email or web address] Password [facility] 04/26/2018, 5:41 PM

## 2018-04-27 NOTE — Progress Notes (Signed)
  Speech Language Pathology Treatment: Cognitive-Linquistic  Patient Details Name: Adam Koch MRN: [id number] DOB: 01-24-1956 Today's Date: 04/27/2018 Time: 3295-1884 SLP Time Calculation (min) (ACUTE ONLY): 40 min  Assessment / Plan / Recommendation Clinical Impression  Pt seen for ongoing cognitive linguistic therapy during his prolonged hospitalization (complicated discharge plan). He is now able to communicate moderate level wants/needs with some paraphasic and apraxic errors. His awareness for sound and word substitutions has improved greatly, however he needs moderate cues to repair errors. Confrontation naming tasks were completed with 90% acc with allowance for apraxic errors and cues to repair semantic paraphasic errors. He is oriented to October and that he's had a stroke. Pt provided with list of [employer] teams to practice reading aloud ([employer] teams reviewed with SLP this date). SLP will continue to follow during his stay as schedule permits.    HPI HPI: Adam Koch a 63 y.o.malewith medical history significant of alcohol abuse, tobacco use, hypertension who is coming to the emergency department due to being found by EMS and RDP wandering on the streets confused. He is unable to provide further history. A friend of the patient talked to EMS and stated that he drinks regularly. He smokes daily an unknown amount of cigarettes. He is only oriented to name and does not follow most simple commands. Severely motion degraded exam. 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 Mild swelling but no evidence of hemorrhage on these limited images. Question small region of acute infarction in the inferior cerebellum on the left as well. Pt failed RN swallow screen and RN requested BSE.      SLP Plan  Continue with current plan of care       Recommendations                   Plan:  Continue with current plan of care       Thank you,  [name], CCC-SLP [phone]                 PORTER,DABNEY 04/27/2018, 1:59 PM

## 2018-04-27 NOTE — Progress Notes (Signed)
PROGRESS NOTE   Adam Koch  WUJ:[id number] DOB: 04/16/1956 DOA: 03/17/2018 PCP: Patient, No Pcp Per   Brief Narrative:   62 year old male with a history of alcohol abuse, tobacco use, hypertension presented with altered mental status. Patient is unable to provide any history secondary to his encephalopathy. Apparently, the patient was agitated and confused on the afternoon of 03/17/2018. According to his girlfriend, the patient walked outside into the backyard in his underwear and began pulling out flowers. Because of his agitation, she called the police who arrived and recommended EMS to bring the patient to the hospital. According to the patient's girlfriend, the patient was "acting drunk"on 03/15/2018.She states that she last saw him normal on 03/14/2018. She states that he frequently goes out to drink alcohol after he gets paid, although she cannot elaborate how much and how often and the patient drinks. However, she states that he has drank alcohol regularly for nearly 20 years. The patient continues to smoke tobacco although the amount is unclear. The been no reports of chest pain, shortness breath, vomiting, diarrhea, abdominal pain, chest pain. In the emergency department, the patient was afebrile hemodynamically stable saturating 100% on room air.   Assessment/Plan: No change in overall plan, still awaiting placement:  Acute metabolic encephalopathy -Multifactorial including Wernicke's encephalopathy, stroke, and possibly hypertensive encephalopathyand THC -Serum B12--589 -TSH--1.094 -Urinalysis--neg for pyuria -Folic acid--11.5 -RPR--neg -Ammonia--21 -UDS--+THC -Thiamine 100mg  daily -He remains pleasantly confused and this is his baseline now.  Acute right MCA infarct -Neurology Consultappreciated-->30 day event monitor recommended.  Patient was monitored in the hospital on telemetry and did not have any significant events -PT/OT evaluation-->SNF which is being  arranged by CSW.  -Speech therapy eval-->regular diet with thin liquids and patient has been tolerating this well.  -CT brain--Moderate area of edema/acute to subacute infarct in the right parietal lobe without evidence for hemorrhage or midline shift -MRI brain--acute infarctR-MCA territorywith few other smaller foci in the same territory;questionable infarct in the inferior cerebellum on the left -MRA brain--motion degraded, no hemodynamically significant stenosis -Carotid Duplex--negative for hemodynamically significant stenosis -CTA H&N--no large vessel occlusion, stable large right MCA infarct, mild interval increase in edema with mild mass-effect around area of infarct. Petechial hemorrhage of right parietal lobe. Distal right MCA parietal branch occlusion. -Echo--EF 55-60%, no WMA, no PFO noted -LDL--68 -HbA1C--5.4 -Antiplatelet--ASA 325 mg -9/13--case discussed by Dr. Arbutus Leas with neurology, Dr. Lurene Shadow vasogenic edema to gradually improve; No need for mannitol  Essential hypertension -Blood pressure stable on amlodipine and metoprolol  Severe Protein malnutrition -continue Ensure for meal supplementation  Epistaxis -unclear etiology, but self limited. lovenox for dvt prophylaxis discontinued. Continue to monitor.  Disposition Plan:Patient is difficult to place.  Still waiting on bed offers.  DSS has guardianship.  Family Communication:none present during rounds  Consultants:neurology  Code Status: FULL   DVT Prophylaxis: frequent ambulation  Procedures: As Listed in Progress Note Above  Antibiotics: None  Subjective: Denies any shortness of breath or chest pain.  No further epistaxis.  Objective: Vitals:   04/26/18 2121 04/27/18 0551 04/27/18 1000 04/27/18 1402  BP: 111/66 134/64 110/83 125/73  Pulse: 72 63 86 70  Resp: 16 20  16   Temp: 98.4 F (36.9 C) 98.3 F (36.8 C)  97.8 F (36.6 C)  TempSrc: Oral Oral  Oral    SpO2: 99% 100%  99%  Weight:      Height:        Intake/Output Summary (Last 24 hours) at 04/27/2018 1841 Last data filed  at 04/27/2018 1700 Gross per 24 hour  Intake 1320 ml  Output -  Net 1320 ml   Filed Weights   03/24/18 1113 04/09/18 1452 04/17/18 1512  Weight: 62.8 kg 65.3 kg 66.2 kg   Examination:  General exam: Alert, awake, oriented x 3 Respiratory system: Clear to auscultation. Respiratory effort normal. Cardiovascular system:RRR. No murmurs, rubs, gallops. Gastrointestinal system: Abdomen is nondistended, soft and nontender. No organomegaly or masses felt. Normal bowel sounds heard.     Data Reviewed: I have personally reviewed following labs and imaging studies  CBC: No results for input(s): WBC, NEUTROABS, HGB, HCT, MCV, PLT in the last 168 hours. Basic Metabolic Panel: Recent Labs  Lab 04/24/18 0538  CREATININE 0.90   GFR: Estimated Creatinine Clearance: 80.7 mL/min (by C-G formula based on SCr of 0.9 mg/dL). Liver Function Tests: No results for input(s): AST, ALT, ALKPHOS, BILITOT, PROT, ALBUMIN in the last 168 hours. No results for input(s): LIPASE, AMYLASE in the last 168 hours. No results for input(s): AMMONIA in the last 168 hours. Coagulation Profile: No results for input(s): INR, PROTIME in the last 168 hours. Cardiac Enzymes: No results for input(s): CKTOTAL, CKMB, CKMBINDEX, TROPONINI in the last 168 hours. BNP (last 3 results) No results for input(s): PROBNP in the last 8760 hours. HbA1C: No results for input(s): HGBA1C in the last 72 hours. CBG: No results for input(s): GLUCAP in the last 168 hours. Lipid Profile: No results for input(s): CHOL, HDL, LDLCALC, TRIG, CHOLHDL, LDLDIRECT in the last 72 hours. Thyroid Function Tests: No results for input(s): TSH, T4TOTAL, FREET4, T3FREE, THYROIDAB in the last 72 hours. Anemia Panel: No results for input(s): VITAMINB12, FOLATE, FERRITIN, TIBC, IRON, RETICCTPCT in the last 72 hours. Sepsis  Labs: No results for input(s): PROCALCITON, LATICACIDVEN in the last 168 hours.  No results found for this or any previous visit (from the past 240 hour(s)).  Radiology Studies: No results found.  Scheduled Meds: . amLODipine  5 mg Oral Daily  . aspirin  325 mg Oral Daily  . feeding supplement (ENSURE ENLIVE)  237 mL Oral Q24H  . folic acid  1 mg Oral Daily  . metoprolol tartrate  12.5 mg Oral BID  . multivitamin with minerals  1 tablet Oral Daily  . pantoprazole  40 mg Oral Q0600  . thiamine  100 mg Oral Daily    LOS: 41 days   Time spent: 15 minutes  Erick Blinks, MD [facility] Pager [phone]  If 7PM-7AM, please contact night-coverage [email or web address] Password [facility] 04/27/2018, 6:41 PM

## 2018-04-27 NOTE — Plan of Care (Signed)
  Problem: SLP Cognition Goals Goal: Patient will utilize external memory aids Description Patient will utilize external memory aids to facilitate recall of information for improved safety with 04/27/2018 1358 by Dorene Ar, CCC-SLP Outcome: Progressing 04/27/2018 1357 by Dorene Ar, CCC-SLP Outcome: Progressing Flowsheets (Taken 04/27/2018 1357) Patient will utilize external memory aids to facilitate recall of ____ information for improved safety with ______: basic; min assist   Problem: SLP Language Goals Goal: Patient will communicate needs/wants with 04/27/2018 1358 by Dorene Ar, CCC-SLP Outcome: Progressing 04/27/2018 1357 by Dorene Ar, CCC-SLP Flowsheets (Taken 04/27/2018 1357) Patient will communicate ____  needs/wants with: basic; min assist   Thank you,  [name], CCC-SLP [phone]

## 2018-04-28 NOTE — Progress Notes (Signed)
PROGRESS NOTE   Adam Koch  ZOX:[id number] DOB: 01-29-1956 DOA: 03/17/2018 PCP: Patient, No Pcp Per   Brief Narrative:   62 year old male with a history of alcohol abuse, tobacco use, hypertension presented with altered mental status. Patient is unable to provide any history secondary to his encephalopathy. Apparently, the patient was agitated and confused on the afternoon of 03/17/2018. According to his girlfriend, the patient walked outside into the backyard in his underwear and began pulling out flowers. Because of his agitation, she called the police who arrived and recommended EMS to bring the patient to the hospital. According to the patient's girlfriend, the patient was "acting drunk"on 03/15/2018.She states that she last saw him normal on 03/14/2018. She states that he frequently goes out to drink alcohol after he gets paid, although she cannot elaborate how much and how often and the patient drinks. However, she states that he has drank alcohol regularly for nearly 20 years. The patient continues to smoke tobacco although the amount is unclear. The been no reports of chest pain, shortness breath, vomiting, diarrhea, abdominal pain, chest pain. In the emergency department, the patient was afebrile hemodynamically stable saturating 100% on room air.   Assessment/Plan: No change in plan.  Still waiting on placement:  Acute metabolic encephalopathy -Multifactorial including Wernicke's encephalopathy, stroke, and possibly hypertensive encephalopathyand THC -Serum B12--589 -TSH--1.094 -Urinalysis--neg for pyuria -Folic acid--11.5 -RPR--neg -Ammonia--21 -UDS--+THC -Thiamine 100mg  daily -He remains pleasantly confused and this is his baseline now.  Acute right MCA infarct -Neurology Consultappreciated-->30 day event monitor recommended.  Patient was monitored in the hospital on telemetry and did not have any significant events -PT/OT evaluation-->SNF which is being arranged  by CSW.  -Speech therapy eval-->regular diet with thin liquids and patient has been tolerating this well.  -CT brain--Moderate area of edema/acute to subacute infarct in the right parietal lobe without evidence for hemorrhage or midline shift -MRI brain--acute infarctR-MCA territorywith few other smaller foci in the same territory;questionable infarct in the inferior cerebellum on the left -MRA brain--motion degraded, no hemodynamically significant stenosis -Carotid Duplex--negative for hemodynamically significant stenosis -CTA H&N--no large vessel occlusion, stable large right MCA infarct, mild interval increase in edema with mild mass-effect around area of infarct. Petechial hemorrhage of right parietal lobe. Distal right MCA parietal branch occlusion. -Echo--EF 55-60%, no WMA, no PFO noted -LDL--68 -HbA1C--5.4 -Antiplatelet--ASA 325 mg -9/13--case discussed by Dr. Arbutus Leas with neurology, Dr. Lurene Shadow vasogenic edema to gradually improve; No need for mannitol  Essential hypertension -Blood pressure stable on amlodipine and metoprolol  Severe Protein malnutrition -continue Ensure for meal supplementation  Epistaxis -unclear etiology, but self limited. lovenox for dvt prophylaxis discontinued.  Patient is ambulating around his room.  Continue to monitor.  Disposition Plan:Patient is difficult to place.  Still waiting on bed offers.  DSS has guardianship.  Family Communication:none present during rounds  Consultants:neurology  Code Status: FULL   DVT Prophylaxis: frequent ambulation  Procedures: As Listed in Progress Note Above  Antibiotics: None  Subjective: No new complaints.  No chest pain or shortness of breath  Objective: Vitals:   04/27/18 2108 04/28/18 0617 04/28/18 0928 04/28/18 1324  BP: 130/64 113/79 110/66 138/82  Pulse: 68 72 80 77  Resp: 20 18  18   Temp: 98.5 F (36.9 C) 98.5 F (36.9 C)  98.7 F (37.1 C)  TempSrc:  Oral Oral    SpO2: 100% 99%  100%  Weight:      Height:        Intake/Output Summary (Last 24  hours) at 04/28/2018 1824 Last data filed at 04/28/2018 1700 Gross per 24 hour  Intake 1320 ml  Output -  Net 1320 ml   Filed Weights   03/24/18 1113 04/09/18 1452 04/17/18 1512  Weight: 62.8 kg 65.3 kg 66.2 kg   Examination:  General exam: Alert, awake, no distress Respiratory system: Clear to auscultation. Respiratory effort normal. Cardiovascular system:RRR. No murmurs, rubs, gallops. Gastrointestinal system: Abdomen is nondistended, soft and nontender. No organomegaly or masses felt. Normal bowel sounds heard.  Data Reviewed: I have personally reviewed following labs and imaging studies  CBC: No results for input(s): WBC, NEUTROABS, HGB, HCT, MCV, PLT in the last 168 hours. Basic Metabolic Panel: Recent Labs  Lab 04/24/18 0538  CREATININE 0.90   GFR: Estimated Creatinine Clearance: 80.7 mL/min (by C-G formula based on SCr of 0.9 mg/dL). Liver Function Tests: No results for input(s): AST, ALT, ALKPHOS, BILITOT, PROT, ALBUMIN in the last 168 hours. No results for input(s): LIPASE, AMYLASE in the last 168 hours. No results for input(s): AMMONIA in the last 168 hours. Coagulation Profile: No results for input(s): INR, PROTIME in the last 168 hours. Cardiac Enzymes: No results for input(s): CKTOTAL, CKMB, CKMBINDEX, TROPONINI in the last 168 hours. BNP (last 3 results) No results for input(s): PROBNP in the last 8760 hours. HbA1C: No results for input(s): HGBA1C in the last 72 hours. CBG: No results for input(s): GLUCAP in the last 168 hours. Lipid Profile: No results for input(s): CHOL, HDL, LDLCALC, TRIG, CHOLHDL, LDLDIRECT in the last 72 hours. Thyroid Function Tests: No results for input(s): TSH, T4TOTAL, FREET4, T3FREE, THYROIDAB in the last 72 hours. Anemia Panel: No results for input(s): VITAMINB12, FOLATE, FERRITIN, TIBC, IRON, RETICCTPCT in the last 72  hours. Sepsis Labs: No results for input(s): PROCALCITON, LATICACIDVEN in the last 168 hours.  No results found for this or any previous visit (from the past 240 hour(s)).  Radiology Studies: No results found.  Scheduled Meds: . amLODipine  5 mg Oral Daily  . aspirin  325 mg Oral Daily  . feeding supplement (ENSURE ENLIVE)  237 mL Oral Q24H  . folic acid  1 mg Oral Daily  . metoprolol tartrate  12.5 mg Oral BID  . multivitamin with minerals  1 tablet Oral Daily  . pantoprazole  40 mg Oral Q0600  . thiamine  100 mg Oral Daily    LOS: 42 days   Time spent: 15 minutes  Erick Blinks, MD [facility] Pager [phone]  If 7PM-7AM, please contact night-coverage [email or web address] Password [facility] 04/28/2018, 6:24 PM

## 2018-04-28 NOTE — Progress Notes (Signed)
Pt has refused ambulation in hall at this time.

## 2018-04-29 NOTE — Progress Notes (Signed)
Removed IV from patient due to length of IV being inserted. Received okay to leave IV out for now, just to check with attending to make sure to leave IV out/

## 2018-04-29 NOTE — Progress Notes (Signed)
PROGRESS NOTE   Adam Koch  WUJ:[id number] DOB: [birth year] DOA: 03/17/2018 PCP: Patient, No Pcp Per   Brief Narrative:   62 year old male with a history of alcohol abuse, tobacco use, hypertension presented with altered mental status. Patient is unable to provide any history secondary to his encephalopathy. Apparently, the patient was agitated and confused on the afternoon of 03/17/2018. According to his girlfriend, the patient walked outside into the backyard in his underwear and began pulling out flowers. Because of his agitation, she called the police who arrived and recommended EMS to bring the patient to the hospital. According to the patient's girlfriend, the patient was "acting drunk"on 03/15/2018.She states that she last saw him normal on 03/14/2018. She states that he frequently goes out to drink alcohol after he gets paid, although she cannot elaborate how much and how often and the patient drinks. However, she states that he has drank alcohol regularly for nearly 20 years. The patient continues to smoke tobacco although the amount is unclear. The been no reports of chest pain, shortness breath, vomiting, diarrhea, abdominal pain, chest pain. In the emergency department, the patient was afebrile hemodynamically stable saturating 100% on room air.   Assessment/Plan: No change in plan for management. Social work is working on bed placement  Acute metabolic encephalopathy -Multifactorial including Wernicke's encephalopathy, stroke, and possibly hypertensive encephalopathyand THC -Serum B12--589 -TSH--1.094 -Urinalysis--neg for pyuria -Folic acid--11.5 -RPR--neg -Ammonia--21 -UDS--+THC -Thiamine 100mg  daily -He remains pleasantly confused and this is his baseline now.  Acute right MCA infarct -Neurology Consultappreciated-->30 day event monitor recommended.  Patient was monitored in the hospital on telemetry and did not have any significant events -PT/OT  evaluation-->SNF which is being arranged by CSW.  -Speech therapy eval-->regular diet with thin liquids and patient has been tolerating this well.  -CT brain--Moderate area of edema/acute to subacute infarct in the right parietal lobe without evidence for hemorrhage or midline shift -MRI brain--acute infarctR-MCA territorywith few other smaller foci in the same territory;questionable infarct in the inferior cerebellum on the left -MRA brain--motion degraded, no hemodynamically significant stenosis -Carotid Duplex--negative for hemodynamically significant stenosis -CTA H&N--no large vessel occlusion, stable large right MCA infarct, mild interval increase in edema with mild mass-effect around area of infarct. Petechial hemorrhage of right parietal lobe. Distal right MCA parietal branch occlusion. -Echo--EF 55-60%, no WMA, no PFO noted -LDL--68 -HbA1C--5.4 -Antiplatelet--ASA 325 mg -9/13--case discussed by Dr. Arbutus Leas with neurology, Dr. Lurene Shadow vasogenic edema to gradually improve; No need for mannitol  Essential hypertension -Blood pressure stable on amlodipine and metoprolol  Severe Protein malnutrition -continue Ensure for meal supplementation  Epistaxis -unclear etiology, but self limited. lovenox for dvt prophylaxis discontinued.  Patient is ambulating around his room.  Continue to monitor.  Disposition Plan:Patient is difficult to place.  Still waiting on bed offers.  DSS has guardianship.  Family Communication:none present during rounds  Consultants:neurology  Code Status: FULL   DVT Prophylaxis: frequent ambulation  Procedures: As Listed in Progress Note Above  Antibiotics: None  Subjective: Sitting up in bed. Denies any pain, nausea or vomiting.  Objective: Vitals:   04/28/18 1324 04/28/18 2053 04/28/18 2057 04/29/18 0632  BP: 138/82  (!) 158/94 (!) 153/87  Pulse: 77  72 (!) 57  Resp: 18  18 18   Temp: 98.7 F (37.1 C)  98.7  F (37.1 C) 97.6 F (36.4 C)  TempSrc:   Oral Oral  SpO2: 100% 98% 99% 100%  Weight:      Height:  Intake/Output Summary (Last 24 hours) at 04/29/2018 1327 Last data filed at 04/29/2018 0900 Gross per 24 hour  Intake 600 ml  Output -  Net 600 ml   Filed Weights   03/24/18 1113 04/09/18 1452 04/17/18 1512  Weight: 62.8 kg 65.3 kg 66.2 kg   Examination:  General exam: Alert, awake, no distress Respiratory system: Clear to auscultation. Respiratory effort normal. Cardiovascular system:RRR. No murmurs, rubs, gallops. Gastrointestinal system: Abdomen is nondistended, soft and nontender. No organomegaly or masses felt. Normal bowel sounds heard.  Data Reviewed: I have personally reviewed following labs and imaging studies  CBC: No results for input(s): WBC, NEUTROABS, HGB, HCT, MCV, PLT in the last 168 hours. Basic Metabolic Panel: Recent Labs  Lab 04/24/18 0538  CREATININE 0.90   GFR: Estimated Creatinine Clearance: 80.7 mL/min (by C-G formula based on SCr of 0.9 mg/dL). Liver Function Tests: No results for input(s): AST, ALT, ALKPHOS, BILITOT, PROT, ALBUMIN in the last 168 hours. No results for input(s): LIPASE, AMYLASE in the last 168 hours. No results for input(s): AMMONIA in the last 168 hours. Coagulation Profile: No results for input(s): INR, PROTIME in the last 168 hours. Cardiac Enzymes: No results for input(s): CKTOTAL, CKMB, CKMBINDEX, TROPONINI in the last 168 hours. BNP (last 3 results) No results for input(s): PROBNP in the last 8760 hours. HbA1C: No results for input(s): HGBA1C in the last 72 hours. CBG: No results for input(s): GLUCAP in the last 168 hours. Lipid Profile: No results for input(s): CHOL, HDL, LDLCALC, TRIG, CHOLHDL, LDLDIRECT in the last 72 hours. Thyroid Function Tests: No results for input(s): TSH, T4TOTAL, FREET4, T3FREE, THYROIDAB in the last 72 hours. Anemia Panel: No results for input(s): VITAMINB12, FOLATE, FERRITIN,  TIBC, IRON, RETICCTPCT in the last 72 hours. Sepsis Labs: No results for input(s): PROCALCITON, LATICACIDVEN in the last 168 hours.  No results found for this or any previous visit (from the past 240 hour(s)).  Radiology Studies: No results found.  Scheduled Meds: . amLODipine  5 mg Oral Daily  . aspirin  325 mg Oral Daily  . feeding supplement (ENSURE ENLIVE)  237 mL Oral Q24H  . folic acid  1 mg Oral Daily  . metoprolol tartrate  12.5 mg Oral BID  . multivitamin with minerals  1 tablet Oral Daily  . pantoprazole  40 mg Oral Q0600  . thiamine  100 mg Oral Daily    LOS: 43 days   Time spent: 15 minutes  Erick Blinks, MD [facility] Pager [phone]  If 7PM-7AM, please contact night-coverage [email or web address] Password [email or web address] 04/29/2018, 1:27 PM

## 2018-04-30 NOTE — Progress Notes (Signed)
PROGRESS NOTE   Adam Koch  ZOX:[id number] DOB: 1955-10-14 DOA: 03/17/2018 PCP: Patient, No Pcp Per   Brief Narrative:   62 year old male with a history of alcohol abuse, tobacco use, hypertension presented with altered mental status. Patient is unable to provide any history secondary to his encephalopathy. Apparently, the patient was agitated and confused on the afternoon of 03/17/2018. According to his girlfriend, the patient walked outside into the backyard in his underwear and began pulling out flowers. Because of his agitation, she called the police who arrived and recommended EMS to bring the patient to the hospital. According to the patient's girlfriend, the patient was "acting drunk"on 03/15/2018.She states that she last saw him normal on 03/14/2018. She states that he frequently goes out to drink alcohol after he gets paid, although she cannot elaborate how much and how often and the patient drinks. However, she states that he has drank alcohol regularly for nearly 20 years. The patient continues to smoke tobacco although the amount is unclear. The been no reports of chest pain, shortness breath, vomiting, diarrhea, abdominal pain, chest pain. In the emergency department, the patient was afebrile hemodynamically stable saturating 100% on room air.   Assessment/Plan: No changes to plan of care. Social work is working on placement:  Acute metabolic encephalopathy -Multifactorial including Wernicke's encephalopathy, stroke, and possibly hypertensive encephalopathyand THC -Serum B12--589 -TSH--1.094 -Urinalysis--neg for pyuria -Folic acid--11.5 -RPR--neg -Ammonia--21 -UDS--+THC -Thiamine 100mg  daily -He remains pleasantly confused and this is his baseline now.  Acute right MCA infarct -Neurology Consultappreciated-->30 day event monitor recommended.  Patient was monitored in the hospital on telemetry and did not have any significant events -PT/OT evaluation-->SNF which  is being arranged by CSW.  -Speech therapy eval-->regular diet with thin liquids and patient has been tolerating this well.  -CT brain--Moderate area of edema/acute to subacute infarct in the right parietal lobe without evidence for hemorrhage or midline shift -MRI brain--acute infarctR-MCA territorywith few other smaller foci in the same territory;questionable infarct in the inferior cerebellum on the left -MRA brain--motion degraded, no hemodynamically significant stenosis -Carotid Duplex--negative for hemodynamically significant stenosis -CTA H&N--no large vessel occlusion, stable large right MCA infarct, mild interval increase in edema with mild mass-effect around area of infarct. Petechial hemorrhage of right parietal lobe. Distal right MCA parietal branch occlusion. -Echo--EF 55-60%, no WMA, no PFO noted -LDL--68 -HbA1C--5.4 -Antiplatelet--ASA 325 mg -9/13--case discussed by Dr. Arbutus Leas with neurology, Dr. Lurene Shadow vasogenic edema to gradually improve; No need for mannitol  Essential hypertension -Blood pressure stable on amlodipine and metoprolol  Severe Protein malnutrition -continue Ensure for meal supplementation  Epistaxis -unclear etiology, but self limited. lovenox for dvt prophylaxis discontinued.  Patient is ambulating around his room.  Continue to monitor.  Disposition Plan:Patient is difficult to place.  Still waiting on bed offers.  DSS has guardianship.  Family Communication:none present during rounds  Consultants:neurology  Code Status: FULL   DVT Prophylaxis: frequent ambulation  Procedures: As Listed in Progress Note Above  Antibiotics: None  Subjective: No new complaints. He feels comfortable  Objective: Vitals:   04/29/18 2200 04/30/18 0613 04/30/18 1336 04/30/18 1428  BP: 119/81 117/78  126/73  Pulse: 72 67  63  Resp: 18 16  16   Temp: 98.4 F (36.9 C) 98.2 F (36.8 C)  98.4 F (36.9 C)  TempSrc: Oral Oral   Oral  SpO2: 100% 100%  100%  Weight:   66.9 kg   Height:   6\' 1"  (1.854 m)     Intake/Output Summary (  Last 24 hours) at 04/30/2018 1719 Last data filed at 04/30/2018 1200 Gross per 24 hour  Intake 480 ml  Output -  Net 480 ml   Filed Weights   04/09/18 1452 04/17/18 1512 04/30/18 1336  Weight: 65.3 kg 66.2 kg 66.9 kg   Examination:  General exam: Alert, awake, no distress Respiratory system: Clear to auscultation. Respiratory effort normal. Cardiovascular system:RRR. No murmurs, rubs, gallops. Gastrointestinal system: Abdomen is nondistended, soft and nontender. No organomegaly or masses felt. Normal bowel sounds heard.  Data Reviewed: I have personally reviewed following labs and imaging studies  CBC: No results for input(s): WBC, NEUTROABS, HGB, HCT, MCV, PLT in the last 168 hours. Basic Metabolic Panel: Recent Labs  Lab 04/24/18 0538  CREATININE 0.90   GFR: Estimated Creatinine Clearance: 81.6 mL/min (by C-G formula based on SCr of 0.9 mg/dL). Liver Function Tests: No results for input(s): AST, ALT, ALKPHOS, BILITOT, PROT, ALBUMIN in the last 168 hours. No results for input(s): LIPASE, AMYLASE in the last 168 hours. No results for input(s): AMMONIA in the last 168 hours. Coagulation Profile: No results for input(s): INR, PROTIME in the last 168 hours. Cardiac Enzymes: No results for input(s): CKTOTAL, CKMB, CKMBINDEX, TROPONINI in the last 168 hours. BNP (last 3 results) No results for input(s): PROBNP in the last 8760 hours. HbA1C: No results for input(s): HGBA1C in the last 72 hours. CBG: No results for input(s): GLUCAP in the last 168 hours. Lipid Profile: No results for input(s): CHOL, HDL, LDLCALC, TRIG, CHOLHDL, LDLDIRECT in the last 72 hours. Thyroid Function Tests: No results for input(s): TSH, T4TOTAL, FREET4, T3FREE, THYROIDAB in the last 72 hours. Anemia Panel: No results for input(s): VITAMINB12, FOLATE, FERRITIN, TIBC, IRON, RETICCTPCT in the  last 72 hours. Sepsis Labs: No results for input(s): PROCALCITON, LATICACIDVEN in the last 168 hours.  No results found for this or any previous visit (from the past 240 hour(s)).  Radiology Studies: No results found.  Scheduled Meds: . amLODipine  5 mg Oral Daily  . aspirin  325 mg Oral Daily  . folic acid  1 mg Oral Daily  . metoprolol tartrate  12.5 mg Oral BID  . pantoprazole  40 mg Oral Q0600  . thiamine  100 mg Oral Daily    LOS: 44 days   Time spent: 15 minutes  Erick Blinks, MD [facility] Pager [phone]  If 7PM-7AM, please contact night-coverage [email or web address] Password [email or web address] 04/30/2018, 5:19 PM

## 2018-04-30 NOTE — Progress Notes (Signed)
Nutrition Follow-up  DOCUMENTATION CODES:  Severe malnutrition in context of social or environmental circumstances  INTERVENTION:  Will make some meal changes to help w/ monotony.   Given Excellent intake/gradual wt gain, D/C ensure and mvi  NUTRITION DIAGNOSIS:  Severe Malnutrition related to social / environmental circumstances(etoh abuse) as evidenced by severe muscle/fat depletion  Ongoing, resolving  GOAL:  Patient will meet greater than or equal to 90% of their needs  MET  MONITOR:  PO intake, Supplement acceptance, Weight trends, I & O's  ASSESSMENT:  62 year old male who presented to the ED on 9/10 after being found wandering in the streets confused. PMH significant for EtOH abuse, tobacco use, hypertension. Pt found to have acute CVA.  Following up with patient again due to prolonged length of stay.   Interval history since past visit unremarkable. Patient quite stable. CSW still working to get patient placed. DSS  has guardianship of patient.     Per Re-review of meal documentation. Pt has eaten 100% of the vast majority of meals since last seen (3x ate 75% and 1x ate 50%). These are the only 4 meals he has not eaten 100% of since 9/13. In addition, he has Ensure enlive ordered q24 hrs; he has been consuming 100% of this.  Today, patient alert sitting on end of bed, finishing up eating his lunch. He had eaten 100% of meal, including 3 chicken legs.   Patient's ability to communicate continues to improve and today he actually completes an entire sentence: "Im trying to get away from here". He again reports a good appetite. Still has no complaints. No n/v/c/d.   RD re weighed patient on standing scale. He was 147.5 lbs. This is a gain of >15.5 lbs since admitted. Wt gain was very desired.   Given continued excellent intake and gradual weight gain, will complete wean off supplements and d/c Ensure entirely. Also no longer feels he warrants MVI. Will d/c.   He was fine  with RD making some meal changes to reduce monotony.   Labs:  a1C: 5.4.  10/18: Creat: .9 Meds: Ensure Enlive q24- Consuming 100% of them. MVI w/ min, Thiamin, Folate, ppi  Diet Order:   Diet Order            Diet regular Room service appropriate? Yes; Fluid consistency: Thin  Diet effective now             EDUCATION NEEDS:  No education needs have been identified at this time  Skin:  Skin Assessment: Reviewed RN Assessment  Last BM:  10/23  Height:  Ht Readings from Last 1 Encounters:  04/30/18 6' 1"  (1.854 m)   Weight:  Wt Readings from Last 1 Encounters:  04/30/18 66.9 kg   Wt Readings from Last 10 Encounters:  04/30/18 66.9 kg  05/04/12 72.6 kg   Ideal Body Weight:  83.64 kg  BMI:  Body mass index is 19.46 kg/m.  Estimated Nutritional Needs:  Kcal:  2000-2200 kcals (30-33 kcal/kg bw) Protein:  80-94 (1.2-1.4g/kg bw) Fluid:  >2 L fluid (30 ml/kg bw)  Burtis Junes RD, LDN, CNSC Clinical Nutrition Available Tues-Sat via Pager: [phone] 04/30/2018 1:42 PM

## 2018-05-01 LAB — CREATININE, SERUM
CREATININE: 0.91 mg/dL (ref 0.61–1.24)
GFR calc Af Amer: >60 mL/min (ref >60)
GFR calc non Af Amer: >60 mL/min (ref >60)

## 2018-05-01 NOTE — Progress Notes (Signed)
PROGRESS NOTE    Adam Koch  ZOX:[id number] DOB: [birth year] DOA: 03/17/2018 PCP: Patient, No Pcp Per   Brief Narrative:   62 year old male with a history of alcohol abuse, tobacco use, hypertension presented with altered mental status. Patient is unable to provide any history secondary to his encephalopathy. Apparently, the patient was agitated and confused on the afternoon of 03/17/2018. According to his girlfriend, the patient walked outside into the backyard in his underwear and began pulling out flowers. Because of his agitation, she called the police who arrived and recommended EMS to bring the patient to the hospital. According to the patient's girlfriend, the patient was "acting drunk"on 03/15/2018.She states that she last saw him normal on 03/14/2018. She states that he frequently goes out to drink alcohol after he gets paid, although she cannot elaborate how much and how often and the patient drinks. However, she states that he has drank alcohol regularly for nearly 20 years. The patient continues to smoke tobacco although the amount is unclear. The been no reports of chest pain, shortness breath, vomiting, diarrhea, abdominal pain, chest pain. In the emergency department, the patient was afebrile hemodynamically stable saturating 100% on room air.   Assessment/Plan: No changes to plan of care. Social work is working on placement:  Acute metabolic encephalopathy -Multifactorial including Wernicke's encephalopathy, stroke, and possibly hypertensive encephalopathyand THC -Serum B12--589 -TSH--1.094 -Urinalysis--neg for pyuria -Folic acid--11.5 -RPR--neg -Ammonia--21 -UDS--+THC -Thiamine 100mg  daily -He remains pleasantly confused and this is his baseline now.  Acute right MCA infarct -Neurology Consultappreciated-->30 day event monitor recommended.  Patient was monitored in the hospital on telemetry and did not have any significant events -PT/OT evaluation-->SNF  which is being arranged by CSW.  -Speech therapy eval-->regular diet with thin liquids and patient has been tolerating this well.  -CT brain--Moderate area of edema/acute to subacute infarct in the right parietal lobe without evidence for hemorrhage or midline shift -MRI brain--acute infarctR-MCA territorywith few other smaller foci in the same territory;questionable infarct in the inferior cerebellum on the left -MRA brain--motion degraded, no hemodynamically significant stenosis -Carotid Duplex--negative for hemodynamically significant stenosis -CTA H&N--no large vessel occlusion, stable large right MCA infarct, mild interval increase in edema with mild mass-effect around area of infarct. Petechial hemorrhage of right parietal lobe. Distal right MCA parietal branch occlusion. -Echo--EF 55-60%, no WMA, no PFO noted -LDL--68 -HbA1C--5.4 -Antiplatelet--ASA 325 mg -9/13--case discussed by Dr. Arbutus Leas with neurology, Dr. Lurene Shadow vasogenic edema to gradually improve; No need for mannitol  Essential hypertension -Blood pressure stable on amlodipine and metoprolol  Severe Protein malnutrition -continue Ensure for meal supplementation  Epistaxis -unclear etiology, but self limited. lovenox for dvt prophylaxis discontinued.  Patient is ambulating around his room.  Continue to monitor.  Disposition Plan:Patient is difficult to place.  Still waiting on bed offers.  DSS has guardianship.  Family Communication:none present during rounds  Consultants:neurology  Code Status: FULL   DVT Prophylaxis: frequent ambulation  Procedures: As Listed in Progress Note Above  Antibiotics: None  Subjective: No new complaints. He feels comfortable. No acute complaints or concerns noted overnight.  Objective: Vitals:   04/30/18 2113 05/01/18 0640 05/01/18 0822 05/01/18 1441  BP: 124/67 127/72 131/81 112/67  Pulse: 83 (!) 58 62 70  Resp: 18 18  18   Temp: 98.4  F (36.9 C) 98 F (36.7 C)  98.4 F (36.9 C)  TempSrc: Oral Oral    SpO2: 100% 100%  100%  Weight:      Height:  Intake/Output Summary (Last 24 hours) at 05/01/2018 1549 Last data filed at 05/01/2018 1456 Gross per 24 hour  Intake 1080 ml  Output -  Net 1080 ml   Filed Weights   04/09/18 1452 04/17/18 1512 04/30/18 1336  Weight: 65.3 kg 66.2 kg 66.9 kg    Examination:  General exam: Appears calm and comfortable  Respiratory system: Clear to auscultation. Respiratory effort normal. Cardiovascular system: S1 & S2 heard, RRR. No JVD, murmurs, rubs, gallops or clicks. No pedal edema. Gastrointestinal system: Abdomen is nondistended, soft and nontender. No organomegaly or masses felt. Normal bowel sounds heard. Central nervous system: Cannot be adequately assessed. Extremities: Symmetric 5 x 5 power. Skin: No rashes, lesions or ulcers Psychiatry: Cannot be adequately assessed.    Data Reviewed: I have personally reviewed following labs and imaging studies  CBC: No results for input(s): WBC, NEUTROABS, HGB, HCT, MCV, PLT in the last 168 hours. Basic Metabolic Panel: Recent Labs  Lab 05/01/18 0558  CREATININE 0.91   GFR: Estimated Creatinine Clearance: 80.7 mL/min (by C-G formula based on SCr of 0.91 mg/dL). Liver Function Tests: No results for input(s): AST, ALT, ALKPHOS, BILITOT, PROT, ALBUMIN in the last 168 hours. No results for input(s): LIPASE, AMYLASE in the last 168 hours. No results for input(s): AMMONIA in the last 168 hours. Coagulation Profile: No results for input(s): INR, PROTIME in the last 168 hours. Cardiac Enzymes: No results for input(s): CKTOTAL, CKMB, CKMBINDEX, TROPONINI in the last 168 hours. BNP (last 3 results) No results for input(s): PROBNP in the last 8760 hours. HbA1C: No results for input(s): HGBA1C in the last 72 hours. CBG: No results for input(s): GLUCAP in the last 168 hours. Lipid Profile: No results for input(s): CHOL,  HDL, LDLCALC, TRIG, CHOLHDL, LDLDIRECT in the last 72 hours. Thyroid Function Tests: No results for input(s): TSH, T4TOTAL, FREET4, T3FREE, THYROIDAB in the last 72 hours. Anemia Panel: No results for input(s): VITAMINB12, FOLATE, FERRITIN, TIBC, IRON, RETICCTPCT in the last 72 hours. Sepsis Labs: No results for input(s): PROCALCITON, LATICACIDVEN in the last 168 hours.  No results found for this or any previous visit (from the past 240 hour(s)).       Radiology Studies: No results found.      Scheduled Meds: . amLODipine  5 mg Oral Daily  . aspirin  325 mg Oral Daily  . folic acid  1 mg Oral Daily  . metoprolol tartrate  12.5 mg Oral BID  . pantoprazole  40 mg Oral Q0600  . thiamine  100 mg Oral Daily   Continuous Infusions:   LOS: 45 days    Time spent: 30 minutes    Jaelle Campanile Hoover Brunette, DO [facility] Pager [phone]  If 7PM-7AM, please contact night-coverage [email or web address] Password [facility] 05/01/2018, 3:49 PM

## 2018-05-01 NOTE — NC FL2 (Signed)
MEDICAID FL2 LEVEL OF CARE SCREENING TOOL     IDENTIFICATION  Patient Name: Adam Koch Birthdate: 1956-01-06 Sex: male Admission Date (Current Location): 03/17/2018  [facility] and [place] Number:  Engineer, maintenance and Address:  [facility],  [address] [place] [address]      Provider Number: [id number]  Attending Physician Name and Address:  Erick Blinks, DO  Relative Name and Phone Number:  [facility] [phone]    Current Level of Care: Hospital Recommended Level of Care: Skilled Nursing Facility Prior Approval Number:    Date Approved/Denied:   PASRR Number: [id number])  Discharge Plan: SNF    Current Diagnoses: Patient Active Problem List   Diagnosis Date Noted  . Malnutrition of moderate degree 04/03/2018  . Wernicke encephalopathy 03/22/2018  . Protein-calorie malnutrition, severe 03/19/2018  . Acute right MCA stroke (HCC) 03/18/2018  . Acute metabolic encephalopathy 03/18/2018  . Acute CVA (cerebrovascular accident) (HCC) 03/17/2018  . Alcohol abuse 03/17/2018  . Hypertension 03/17/2018  . Tobacco use 03/17/2018  . Leukocytosis 03/17/2018    Orientation RESPIRATION BLADDER Height & Weight     Self  Normal Continent Weight: 147 lb 8 oz (66.9 kg) Height:  6\' 1"  (185.4 cm)  BEHAVIORAL SYMPTOMS/MOOD NEUROLOGICAL BOWEL NUTRITION STATUS      Continent Diet(Regular)  AMBULATORY STATUS COMMUNICATION OF NEEDS Skin   Supervision Verbally Normal                       Personal Care Assistance Level of Assistance  Bathing, Feeding, Dressing Bathing Assistance: Limited assistance Feeding assistance: (supervision. My need cueing to eat) Dressing Assistance: Limited assistance     Functional Limitations Info  Sight, Hearing, Speech Sight Info: Adequate Hearing Info: Adequate Speech Info: Adequate    SPECIAL CARE FACTORS FREQUENCY  Speech therapy     PT  Frequency: 5x/week       Speech Therapy Frequency: 3x/week      Contractures Contractures Info: Not present    Additional Factors Info  Code Status, Allergies Code Status Info: Full code Allergies Info: NKA           Current Medications (05/01/2018):  This is the current hospital active medication list Current Facility-Administered Medications  Medication Dose Route Frequency Provider Last Rate Last Dose  . acetaminophen (TYLENOL) tablet 650 mg  650 mg Oral Q4H PRN Bobette Mo, MD   650 mg at 04/14/18 1946   Or  . acetaminophen (TYLENOL) solution 650 mg  650 mg Per Tube Q4H PRN Bobette Mo, MD       Or  . acetaminophen (TYLENOL) suppository 650 mg  650 mg Rectal Q4H PRN Bobette Mo, MD      . amLODipine (NORVASC) tablet 5 mg  5 mg Oral Daily Catarina Hartshorn, MD   5 mg at 05/01/18 1324  . aspirin tablet 325 mg  325 mg Oral Daily Tat, Onalee Hua, MD   325 mg at 05/01/18 4010  . folic acid (FOLVITE) tablet 1 mg  1 mg Oral Daily Bobette Mo, MD   1 mg at 05/01/18 2725  . hydrALAZINE (APRESOLINE) injection 10 mg  10 mg Intravenous Q6H PRN Tat, Onalee Hua, MD      . metoprolol tartrate (LOPRESSOR) tablet 12.5 mg  12.5 mg Oral BID Laural Benes, Clanford L, MD   12.5 mg at 05/01/18 3664  . ondansetron (ZOFRAN) tablet 4 mg  4 mg Oral Q6H PRN Robb Matar,  Ernestene Kiel, MD       Or  . ondansetron [facility]) injection 4 mg  4 mg Intravenous Q6H PRN Bobette Mo, MD      . pantoprazole (PROTONIX) EC tablet 40 mg  40 mg Oral Q0600 Laural Benes, Clanford L, MD   40 mg at 05/01/18 0531  . senna-docusate (Senokot-S) tablet 1 tablet  1 tablet Oral QHS PRN Bobette Mo, MD      . thiamine (VITAMIN B-1) tablet 100 mg  100 mg Oral Daily Tat, David, MD   100 mg at 05/01/18 1610     Discharge Medications: Please see discharge summary for a list of discharge medications.  Relevant Imaging Results:  Relevant Lab Results:   Additional Information SSN: [id number].   Melida Northington,  [name], LCSW

## 2018-05-02 NOTE — Progress Notes (Signed)
PROGRESS NOTE    Adam Koch  UEA:[id number] DOB: 1956/04/12 DOA: 03/17/2018 PCP: Patient, No Pcp Per   Brief Narrative:   62 year old male with a history of alcohol abuse, tobacco use, hypertension presented with altered mental status. Patient is unable to provide any history secondary to his encephalopathy. Apparently, the patient was agitated and confused on the afternoon of 03/17/2018. According to his girlfriend, the patient walked outside into the backyard in his underwear and began pulling out flowers. Because of his agitation, she called the police who arrived and recommended EMS to bring the patient to the hospital. According to the patient's girlfriend, the patient was "acting drunk"on 03/15/2018.She states that she last saw him normal on 03/14/2018. She states that he frequently goes out to drink alcohol after he gets paid, although she cannot elaborate how much and how often and the patient drinks. However, she states that he has drank alcohol regularly for nearly 20 years. The patient continues to smoke tobacco although the amount is unclear. The been no reports of chest pain, shortness breath, vomiting, diarrhea, abdominal pain, chest pain. In the emergency department, the patient was afebrile hemodynamically stable saturating 100% on room air.   Assessment/Plan:No changes to plan of care. Social work is working on placement:  Acute metabolic encephalopathy -Multifactorial including Wernicke's encephalopathy, stroke, and possibly hypertensive encephalopathyand THC -Serum B12--589 -TSH--1.094 -Urinalysis--neg for pyuria -Folic acid--11.5 -RPR--neg -Ammonia--21 -UDS--+THC -Thiamine 100mg  daily -He remains pleasantly confused and this is his baseline now.  Acute right MCA infarct -Neurology Consultappreciated-->30 day event monitor recommended. Patient was monitored in the hospital on telemetry and did not have any significant events -PT/OT evaluation-->SNF  which is being arranged by CSW.  -Speech therapy eval-->regular diet with thin liquids and patient has been tolerating this well.  -CT brain--Moderate area of edema/acute to subacute infarct in the right parietal lobe without evidence for hemorrhage or midline shift -MRI brain--acute infarctR-MCA territorywith few other smaller foci in the same territory;questionable infarct in the inferior cerebellum on the left -MRA brain--motion degraded, no hemodynamically significant stenosis -Carotid Duplex--negative for hemodynamically significant stenosis -CTA H&N--no large vessel occlusion, stable large right MCA infarct, mild interval increase in edema with mild mass-effect around area of infarct. Petechial hemorrhage of right parietal lobe. Distal right MCA parietal branch occlusion. -Echo--EF 55-60%, no WMA, no PFO noted -LDL--68 -HbA1C--5.4 -Antiplatelet--ASA 325 mg -9/13--case discussed by Dr. Arbutus Leas with neurology, Dr. Lurene Shadow vasogenic edema to gradually improve; No need for mannitol  Essential hypertension -Blood pressure stable on amlodipine and metoprolol  Severe Protein malnutrition -continue Ensure for meal supplementation  Epistaxis -unclear etiology, but self limited. lovenox for dvt prophylaxis discontinued. Patient is ambulating around his room. Continue to monitor.  Disposition Plan:Patient is difficult to place. Still waiting on bed offers. DSS has guardianship.  Family Communication:none present during rounds  Consultants:neurology  Code Status: FULL   DVT Prophylaxis: frequent ambulation  Procedures: As Listed in Progress Note Above  Antibiotics: None  Subjective: Patient seen and evaluated today with no new acute complaints or concerns. No acute concerns or events noted overnight.  Objective: Vitals:   05/01/18 1441 05/01/18 2156 05/01/18 2212 05/02/18 0704  BP: 112/67 126/79 122/69 115/71  Pulse: 70 67 63 63  Resp:  18  18 20   Temp: 98.4 F (36.9 C)  98.2 F (36.8 C) 98.2 F (36.8 C)  TempSrc:   Oral Oral  SpO2: 100%  100% 99%  Weight:      Height:  Intake/Output Summary (Last 24 hours) at 05/02/2018 1201 Last data filed at 05/02/2018 0900 Gross per 24 hour  Intake 1200 ml  Output -  Net 1200 ml   Filed Weights   04/09/18 1452 04/17/18 1512 04/30/18 1336  Weight: 65.3 kg 66.2 kg 66.9 kg    Examination:  General exam: Appears calm and comfortable  Respiratory system: Clear to auscultation. Respiratory effort normal. Cardiovascular system: S1 & S2 heard, RRR. No JVD, murmurs, rubs, gallops or clicks. No pedal edema. Gastrointestinal system: Abdomen is nondistended, soft and nontender. No organomegaly or masses felt. Normal bowel sounds heard. Central nervous system: Cannot be fully assessed. Extremities: Symmetric 5 x 5 power. Skin: No rashes, lesions or ulcers Psychiatry: Cannot be fully assessed.    Data Reviewed: I have personally reviewed following labs and imaging studies  CBC: No results for input(s): WBC, NEUTROABS, HGB, HCT, MCV, PLT in the last 168 hours. Basic Metabolic Panel: Recent Labs  Lab 05/01/18 0558  CREATININE 0.91   GFR: Estimated Creatinine Clearance: 80.7 mL/min (by C-G formula based on SCr of 0.91 mg/dL). Liver Function Tests: No results for input(s): AST, ALT, ALKPHOS, BILITOT, PROT, ALBUMIN in the last 168 hours. No results for input(s): LIPASE, AMYLASE in the last 168 hours. No results for input(s): AMMONIA in the last 168 hours. Coagulation Profile: No results for input(s): INR, PROTIME in the last 168 hours. Cardiac Enzymes: No results for input(s): CKTOTAL, CKMB, CKMBINDEX, TROPONINI in the last 168 hours. BNP (last 3 results) No results for input(s): PROBNP in the last 8760 hours. HbA1C: No results for input(s): HGBA1C in the last 72 hours. CBG: No results for input(s): GLUCAP in the last 168 hours. Lipid Profile: No results for  input(s): CHOL, HDL, LDLCALC, TRIG, CHOLHDL, LDLDIRECT in the last 72 hours. Thyroid Function Tests: No results for input(s): TSH, T4TOTAL, FREET4, T3FREE, THYROIDAB in the last 72 hours. Anemia Panel: No results for input(s): VITAMINB12, FOLATE, FERRITIN, TIBC, IRON, RETICCTPCT in the last 72 hours. Sepsis Labs: No results for input(s): PROCALCITON, LATICACIDVEN in the last 168 hours.  No results found for this or any previous visit (from the past 240 hour(s)).       Radiology Studies: No results found.      Scheduled Meds: . amLODipine  5 mg Oral Daily  . aspirin  325 mg Oral Daily  . folic acid  1 mg Oral Daily  . metoprolol tartrate  12.5 mg Oral BID  . pantoprazole  40 mg Oral Q0600  . thiamine  100 mg Oral Daily   Continuous Infusions:   LOS: 46 days    Time spent: 30 minutes    Javoris Star Hoover Brunette, DO [facility] Pager [phone]  If 7PM-7AM, please contact night-coverage [email or web address] Password [email or web address] 05/02/2018, 12:01 PM

## 2018-05-03 NOTE — Progress Notes (Signed)
PROGRESS NOTE    Adam Koch  NFA:[id number] DOB: [birth year] DOA: 03/17/2018 PCP: Patient, No Pcp Per   Brief Narrative:   62 year old male with a history of alcohol abuse, tobacco use, hypertension presented with altered mental status. Patient is unable to provide any history secondary to his encephalopathy. Apparently, the patient was agitated and confused on the afternoon of 03/17/2018. According to his girlfriend, the patient walked outside into the backyard in his underwear and began pulling out flowers. Because of his agitation, she called the police who arrived and recommended EMS to bring the patient to the hospital. According to the patient's girlfriend, the patient was "acting drunk"on 03/15/2018.She states that she last saw him normal on 03/14/2018. She states that he frequently goes out to drink alcohol after he gets paid, although she cannot elaborate how much and how often and the patient drinks. However, she states that he has drank alcohol regularly for nearly 20 years. The patient continues to smoke tobacco although the amount is unclear. The been no reports of chest pain, shortness breath, vomiting, diarrhea, abdominal pain, chest pain. In the emergency department, the patient was afebrile hemodynamically stable saturating 100% on room air.  He continues to have no significant complaints or concerns.  Assessment/Plan:No changes to plan of care. Social work is working on placement:  Acute metabolic encephalopathy -Multifactorial including Wernicke's encephalopathy, stroke, and possibly hypertensive encephalopathyand THC -Serum B12--589 -TSH--1.094 -Urinalysis--neg for pyuria -Folic acid--11.5 -RPR--neg -Ammonia--21 -UDS--+THC -Thiamine 100mg  daily -He remains pleasantly confused and this is his baseline now.  Acute right MCA infarct -Neurology Consultappreciated-->30 day event monitor recommended. Patient was monitored in the hospital on telemetry and did  not have any significant events -PT/OT evaluation-->SNF which is being arranged by CSW.  -Speech therapy eval-->regular diet with thin liquids and patient has been tolerating this well.  -CT brain--Moderate area of edema/acute to subacute infarct in the right parietal lobe without evidence for hemorrhage or midline shift -MRI brain--acute infarctR-MCA territorywith few other smaller foci in the same territory;questionable infarct in the inferior cerebellum on the left -MRA brain--motion degraded, no hemodynamically significant stenosis -Carotid Duplex--negative for hemodynamically significant stenosis -CTA H&N--no large vessel occlusion, stable large right MCA infarct, mild interval increase in edema with mild mass-effect around area of infarct. Petechial hemorrhage of right parietal lobe. Distal right MCA parietal branch occlusion. -Echo--EF 55-60%, no WMA, no PFO noted -LDL--68 -HbA1C--5.4 -Antiplatelet--ASA 325 mg -9/13--case discussed by Dr. Arbutus Leas with neurology, Dr. Lurene Shadow vasogenic edema to gradually improve; No need for mannitol  Essential hypertension -Blood pressure stable on amlodipine and metoprolol  Severe Protein malnutrition -continue Ensure for meal supplementation  Epistaxis -unclear etiology, but self limited. lovenox for dvt prophylaxis discontinued. Patient is ambulating around his room. Continue to monitor.  Disposition Plan:Patient is difficult to place. Still waiting on bed offers. DSS has guardianship.  Family Communication:none present during rounds  Consultants:neurology  Code Status: FULL   DVT Prophylaxis: frequent ambulation  Procedures: As Listed in Progress Note Above  Antibiotics: None  Subjective: Patient seen and evaluated today with no new acute complaints or concerns. No acute concerns or events noted overnight.  Objective: Vitals:   05/02/18 0704 05/02/18 1359 05/02/18 2130 05/03/18 0508    BP: 115/71 137/80 120/79 (!) 172/94  Pulse: 63 71 79 65  Resp: 20 17 18 18   Temp: 98.2 F (36.8 C) 98.6 F (37 C) 98.3 F (36.8 C) 98.3 F (36.8 C)  TempSrc: Oral  Oral Oral  SpO2: 99% 100% 99%  100%  Weight:      Height:        Intake/Output Summary (Last 24 hours) at 05/03/2018 1259 Last data filed at 05/03/2018 0900 Gross per 24 hour  Intake 1200 ml  Output -  Net 1200 ml   Filed Weights   04/09/18 1452 04/17/18 1512 04/30/18 1336  Weight: 65.3 kg 66.2 kg 66.9 kg    Examination:  General exam: Appears calm and comfortable  Respiratory system: Clear to auscultation. Respiratory effort normal. Cardiovascular system: S1 & S2 heard, RRR. No JVD, murmurs, rubs, gallops or clicks. No pedal edema. Gastrointestinal system: Abdomen is nondistended, soft and nontender. No organomegaly or masses felt. Normal bowel sounds heard. Central nervous system: Alert and oriented. No focal neurological deficits. Extremities: Symmetric 5 x 5 power. Skin: No rashes, lesions or ulcers Psychiatry: Judgement and insight appear normal. Mood & affect appropriate.     Data Reviewed: I have personally reviewed following labs and imaging studies  CBC: No results for input(s): WBC, NEUTROABS, HGB, HCT, MCV, PLT in the last 168 hours. Basic Metabolic Panel: Recent Labs  Lab 05/01/18 0558  CREATININE 0.91   GFR: Estimated Creatinine Clearance: 80.7 mL/min (by C-G formula based on SCr of 0.91 mg/dL). Liver Function Tests: No results for input(s): AST, ALT, ALKPHOS, BILITOT, PROT, ALBUMIN in the last 168 hours. No results for input(s): LIPASE, AMYLASE in the last 168 hours. No results for input(s): AMMONIA in the last 168 hours. Coagulation Profile: No results for input(s): INR, PROTIME in the last 168 hours. Cardiac Enzymes: No results for input(s): CKTOTAL, CKMB, CKMBINDEX, TROPONINI in the last 168 hours. BNP (last 3 results) No results for input(s): PROBNP in the last 8760  hours. HbA1C: No results for input(s): HGBA1C in the last 72 hours. CBG: No results for input(s): GLUCAP in the last 168 hours. Lipid Profile: No results for input(s): CHOL, HDL, LDLCALC, TRIG, CHOLHDL, LDLDIRECT in the last 72 hours. Thyroid Function Tests: No results for input(s): TSH, T4TOTAL, FREET4, T3FREE, THYROIDAB in the last 72 hours. Anemia Panel: No results for input(s): VITAMINB12, FOLATE, FERRITIN, TIBC, IRON, RETICCTPCT in the last 72 hours. Sepsis Labs: No results for input(s): PROCALCITON, LATICACIDVEN in the last 168 hours.  No results found for this or any previous visit (from the past 240 hour(s)).       Radiology Studies: No results found.      Scheduled Meds: . amLODipine  5 mg Oral Daily  . aspirin  325 mg Oral Daily  . folic acid  1 mg Oral Daily  . metoprolol tartrate  12.5 mg Oral BID  . pantoprazole  40 mg Oral Q0600  . thiamine  100 mg Oral Daily   Continuous Infusions:   LOS: 47 days    Time spent: 30 minutes    Arisbeth Purrington Hoover Brunette, DO [facility] Pager [phone]  If 7PM-7AM, please contact night-coverage [email or web address] Password [email or web address] 05/03/2018, 12:59 PM

## 2018-05-04 MED ORDER — PANTOPRAZOLE SODIUM 40 MG PO TBEC: 40.0000 mg | DELAYED_RELEASE_TABLET | Freq: Every day | ORAL | 0 refills | Status: AC

## 2018-05-04 MED ORDER — FOLIC ACID 1 MG PO TABS: 1.0000 mg | ORAL_TABLET | Freq: Every day | ORAL | 0 refills | Status: AC

## 2018-05-04 MED ORDER — ASPIRIN 325 MG PO TABS: 325.0000 mg | ORAL_TABLET | Freq: Every day | ORAL | 2 refills | Status: AC

## 2018-05-04 MED ORDER — THIAMINE HCL 100 MG PO TABS: 100.0000 mg | ORAL_TABLET | Freq: Every day | ORAL | 0 refills | Status: AC

## 2018-05-04 MED ORDER — AMLODIPINE BESYLATE 5 MG PO TABS: 5.0000 mg | ORAL_TABLET | Freq: Every day | ORAL | 0 refills | Status: AC

## 2018-05-04 NOTE — Clinical Social Work Placement (Signed)
   CLINICAL SOCIAL WORK PLACEMENT  NOTE  Date:  05/04/2018  Patient Details  Name: Adam Koch MRN: [id number] Date of Birth: 10-29-1955  Clinical Social Work is seeking post-discharge placement for this patient at the Skilled  Nursing Facility level of care (*CSW will initial, date and re-position this form in  chart as items are completed):  Yes   Patient/family provided with [facility] Clinical Social Work Department's list of facilities offering this level of care within the geographic area requested by the patient (or if unable, by the patient's family).  Yes   Patient/family informed of their freedom to choose among providers that offer the needed level of care, that participate in Medicare, Medicaid or managed care program needed by the patient, have an available bed and are willing to accept the patient.  Yes   Patient/family informed of [facility]'s ownership interest in [facility] and [facility], as well as of the fact that they are under no obligation to receive care at these facilities.  PASRR submitted to EDS on 03/20/18     PASRR number received on 03/20/18     Existing PASRR number confirmed on       FL2 transmitted to all facilities in geographic area requested by pt/family on 03/20/18     FL2 transmitted to all facilities within larger geographic area on       Patient informed that his/her managed care company has contracts with or will negotiate with certain facilities, including the following:        Yes   Patient/family informed of bed offers received.  Patient chooses bed at [facility]     Physician recommends and patient chooses bed at      Patient to be transferred to [facility] on 05/04/18.  Patient to be transferred to facility by [facility]     Patient family notified on 05/04/18 of transfer.  Name of family member notified:  Message left for guardian, Hiram Gash.       PHYSICIAN       Additional Comment:   Facility aware. Discharge clinicals sent. LCSW signing off.   _______________________________________________ Annice Needy, LCSW 05/04/2018, 1:58 PM

## 2018-05-04 NOTE — Discharge Summary (Signed)
Physician Discharge Summary  Adam Koch ZOX:[id number] DOB: 1956/07/06 DOA: 03/17/2018  PCP: Patient, No Pcp Per  Admit date: 03/17/2018  Discharge date: 05/04/2018  Admitted From:Home  Disposition:  [facility]  Recommendations for Outpatient Follow-up:  1. Follow-up at facility and continue medications as prescribed  Home Health: None  Equipment/Devices: None  Discharge Condition: Stable  CODE STATUS: Full  Diet recommendation: Heart Healthy  Brief/Interim Summary: 62 year old male with a history of alcohol abuse, tobacco use, hypertension presented with altered mental status. Patient is unable to provide any history secondary to his encephalopathy. Apparently, the patient was agitated and confused on the afternoon of 03/17/2018. According to his girlfriend, the patient walked outside into the backyard in his underwear and began pulling out flowers. Because of his agitation, she called the police who arrived and recommended EMS to bring the patient to the hospital. According to the patient's girlfriend, the patient was "acting drunk"on 03/15/2018.She states that she last saw him normal on 03/14/2018. She states that he frequently goes out to drink alcohol after he gets paid, although she cannot elaborate how much and how often and the patient drinks. However, she states that he has drank alcohol regularly for nearly 20 years. The patient continues to smoke tobacco although the amount is unclear. The been no reports of chest pain, shortness breath, vomiting, diarrhea, abdominal pain, chest pain. In the emergency department, the patient was afebrile hemodynamically stable saturating 100% on room air.  He continues to have no significant complaints or concerns.  He was admitted for acute metabolic encephalopathy which is multifactorial and related to stroke with acute right MCA infarct, hypertensive encephalopathy, marijuana use, and Warnicke's encephalopathy.  He has  been placed on folic acid and thiamine and continues to remain pleasantly confused.  Clinical social workers had worked on placement for the patient and DSS has assumed guardianship.  He finally has placement to [facility] in [place] on discharge after over a month of being hospitalized in stable condition.  He will remain on amlodipine for blood pressure control and we will avoid metoprolol due to some bradycardia.  He will also require Ensure for meal supplementation due to severe protein calorie malnutrition.  Discharge Diagnoses:  Principal Problem:   Acute CVA (cerebrovascular accident) [facility]) Active Problems:   Alcohol abuse   Hypertension   Tobacco use   Leukocytosis   Acute right MCA stroke (HCC)   Acute metabolic encephalopathy   Protein-calorie malnutrition, severe   Wernicke encephalopathy   Malnutrition of moderate degree  Acute metabolic encephalopathy -Multifactorial including Wernicke's encephalopathy, stroke, and possibly hypertensive encephalopathyand THC -Serum B12--589 -TSH--1.094 -Urinalysis--neg for pyuria -Folic acid--11.5 -RPR--neg -Ammonia--21 -UDS--+THC -Thiamine 100mg  daily -He remains pleasantly confused and this is his baseline now.  Acute right MCA infarct -Neurology Consultappreciated-->30 day event monitor recommended. Patient was monitored in the hospital on telemetry and did not have any significant events -PT/OT evaluation-->SNF which is being arranged by CSW.  -Speech therapy eval-->regular diet with thin liquids and patient has been tolerating this well.  -CT brain--Moderate area of edema/acute to subacute infarct in the right parietal lobe without evidence for hemorrhage or midline shift -MRI brain--acute infarctR-MCA territorywith few other smaller foci in the same territory;questionable infarct in the inferior cerebellum on the left -MRA brain--motion degraded, no hemodynamically significant stenosis -Carotid Duplex--negative for  hemodynamically significant stenosis -CTA H&N--no large vessel occlusion, stable large right MCA infarct, mild interval increase in edema with mild mass-effect around area of infarct. Petechial hemorrhage of  right parietal lobe. Distal right MCA parietal branch occlusion. -Echo--EF 55-60%, no WMA, no PFO noted -LDL--68 -HbA1C--5.4 -Antiplatelet--ASA 325 mg -9/13--case discussed by Dr. Arbutus Leas with neurology, Dr. Lurene Shadow vasogenic edema to gradually improve; No need for mannitol  Essential hypertension -Blood pressure stable on amlodipine, hold metoprolol due to bradycardia.  Severe Protein malnutrition -continue Ensure for meal supplementation  Epistaxis -unclear etiology, but self limited and resolved Discharge Instructions  Discharge Instructions    Diet - low sodium heart healthy   Complete by:  As directed    Increase activity slowly   Complete by:  As directed      Allergies as of 05/04/2018   No Known Allergies     Medication List    TAKE these medications   amLODipine 5 MG tablet Commonly known as:  NORVASC Take 1 tablet (5 mg total) by mouth daily. Start taking on:  05/05/2018   aspirin 325 MG tablet Take 1 tablet (325 mg total) by mouth daily. Start taking on:  05/05/2018   folic acid 1 MG tablet Commonly known as:  FOLVITE Take 1 tablet (1 mg total) by mouth daily. Start taking on:  05/05/2018   pantoprazole 40 MG tablet Commonly known as:  PROTONIX Take 1 tablet (40 mg total) by mouth daily at 6 (six) AM. Start taking on:  05/05/2018   thiamine 100 MG tablet Take 1 tablet (100 mg total) by mouth daily. Start taking on:  05/05/2018       Contact information for follow-up providers    Branch, Dorothe Pea, MD Follow up.   Specialty:  Cardiology Why:  The office will contact you to arrange for a 30-day cardiac event monitor once discharged from the hospital.  Contact information: [address] [place]  [address] [phone]            Contact information for after-discharge care    Destination    [facility] .   Service:  Skilled Nursing Contact information: [address] [place] [address] [phone]                 No Known Allergies  Consultations:  None   Procedures/Studies:  No results found.  Discharge Exam: Vitals:   05/03/18 2109 05/04/18 0501  BP: 139/63 (!) 147/87  Pulse: 68 (!) 56  Resp: 18 18  Temp: 98.6 F (37 C) 98.4 F (36.9 C)  SpO2: 100% 100%   Vitals:   05/03/18 0508 05/03/18 1332 05/03/18 2109 05/04/18 0501  BP: (!) 172/94 124/68 139/63 (!) 147/87  Pulse: 65 69 68 (!) 56  Resp: 18 18 18 18   Temp: 98.3 F (36.8 C) 98 F (36.7 C) 98.6 F (37 C) 98.4 F (36.9 C)  TempSrc: Oral  Oral Oral  SpO2: 100% 100% 100% 100%  Weight:      Height:        General: Pt is alert, awake, not in acute distress Cardiovascular: RRR, S1/S2 +, no rubs, no gallops Respiratory: CTA bilaterally, no wheezing, no rhonchi Abdominal: Soft, NT, ND, bowel sounds + Extremities: no edema, no cyanosis    The results of significant diagnostics from this hospitalization (including imaging, microbiology, ancillary and laboratory) are listed below for reference.     Microbiology: No results found for this or any previous visit (from the past 240 hour(s)).   Labs: BNP (last 3 results) No results for input(s): BNP in the last 8760 hours. Basic Metabolic Panel: Recent Labs  Lab 05/01/18  1610  CREATININE 0.91   Liver Function Tests: No results for input(s): AST, ALT, ALKPHOS, BILITOT, PROT, ALBUMIN in the last 168 hours. No results for input(s): LIPASE, AMYLASE in the last 168 hours. No results for input(s): AMMONIA in the last 168 hours. CBC: No results for input(s): WBC, NEUTROABS, HGB, HCT, MCV, PLT in the last 168 hours. Cardiac Enzymes: No results for input(s): CKTOTAL, CKMB, CKMBINDEX, TROPONINI in the last  168 hours. BNP: Invalid input(s): POCBNP CBG: No results for input(s): GLUCAP in the last 168 hours. D-Dimer No results for input(s): DDIMER in the last 72 hours. Hgb A1c No results for input(s): HGBA1C in the last 72 hours. Lipid Profile No results for input(s): CHOL, HDL, LDLCALC, TRIG, CHOLHDL, LDLDIRECT in the last 72 hours. Thyroid function studies No results for input(s): TSH, T4TOTAL, T3FREE, THYROIDAB in the last 72 hours.  Invalid input(s): FREET3 Anemia work up No results for input(s): VITAMINB12, FOLATE, FERRITIN, TIBC, IRON, RETICCTPCT in the last 72 hours. Urinalysis    Component Value Date/Time   COLORURINE YELLOW 03/18/2018 1910   APPEARANCEUR CLEAR 03/18/2018 1910   LABSPEC 1.018 03/18/2018 1910   PHURINE 5.0 03/18/2018 1910   GLUCOSEU NEGATIVE 03/18/2018 1910   HGBUR SMALL (A) 03/18/2018 1910   BILIRUBINUR NEGATIVE 03/18/2018 1910   KETONESUR 20 (A) 03/18/2018 1910   PROTEINUR NEGATIVE 03/18/2018 1910   NITRITE NEGATIVE 03/18/2018 1910   LEUKOCYTESUR NEGATIVE 03/18/2018 1910   Sepsis Labs Invalid input(s): PROCALCITONIN,  WBC,  LACTICIDVEN Microbiology No results found for this or any previous visit (from the past 240 hour(s)).   Time coordinating discharge: 35 minutes  SIGNED:   Erick Blinks, DO [facility] 05/04/2018, 12:44 PM Pager [phone]  If 7PM-7AM, please contact night-coverage [email or web address] Password [email or web address]

## 2018-05-05 NOTE — Progress Notes (Signed)
PT transported via stretch by [facility] to [facility]. PT A&Ox4. PT has no S/S distress. No IV. PT stable. Travels with belongings.

## 2019-04-26 IMAGING — CT CT ANGIO HEAD
3 of 11 series · 17 of 47 positions shown · IV contrast (iopamidol)
Comparison: 03/17/2018 CT head. 03/18/2018 MRI and MRA head.
03/18/2018 carotid ultrasound.

CLINICAL DATA: 61 y/o M; right MCA distribution stroke with altered
mental status for follow-up.

EXAM:
CT ANGIOGRAPHY HEAD
TECHNIQUE: Multidetector CT imaging of the head was performed using the
standard protocol during bolus administration of intravenous
contrast. Multiplanar CT image reconstructions and MIPs were
obtained to evaluate the vascular anatomy.
CONTRAST:  75mL [id number] IOPAMIDOL ([id number]) INJECTION 76%

[Series 10: ax thin · axial · 0.39mm/px · z∈[+16,+154]mm · 12 of 163 slices shown]
[im 12/163  brain]
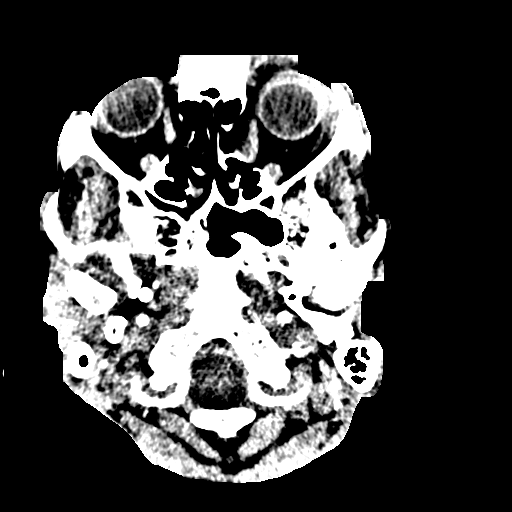
[im 24/163  bone]
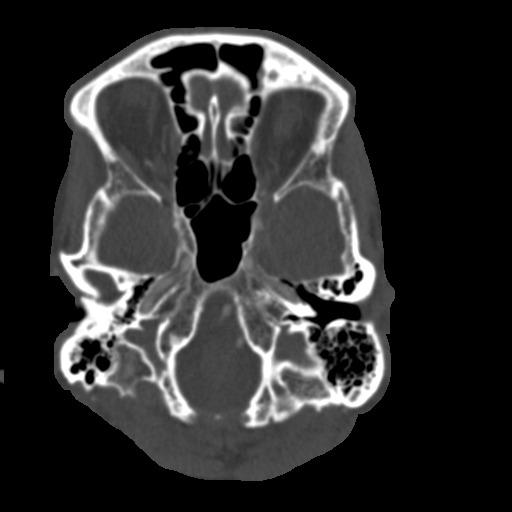
[im 35/163  brain]
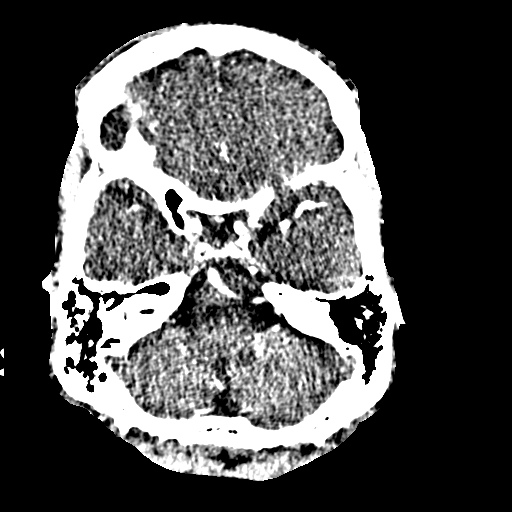
[im 47/163  bone]
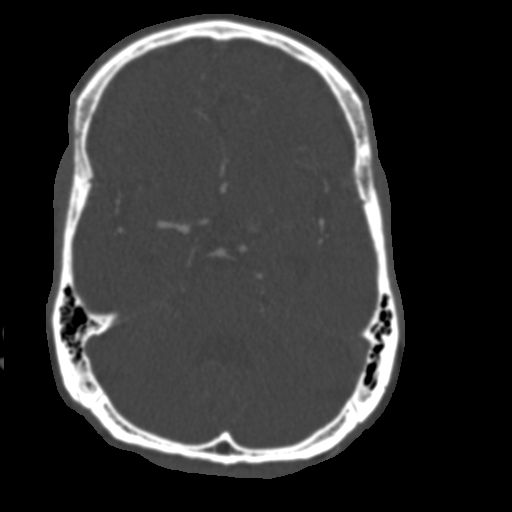
[im 58/163  brain]
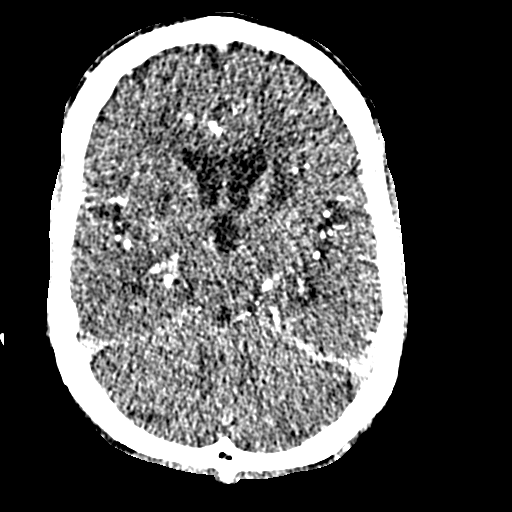
[im 70/163  bone]
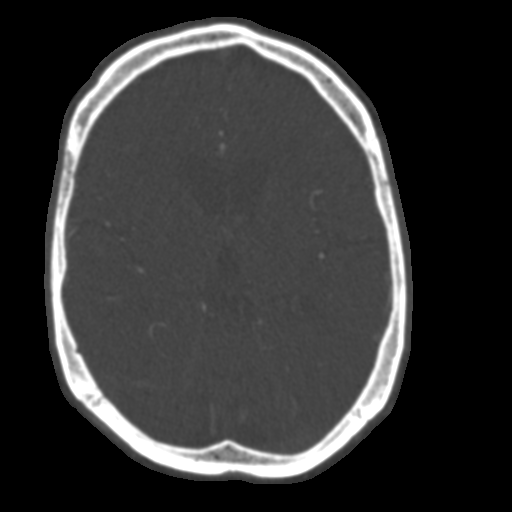
[im 93/163  brain]
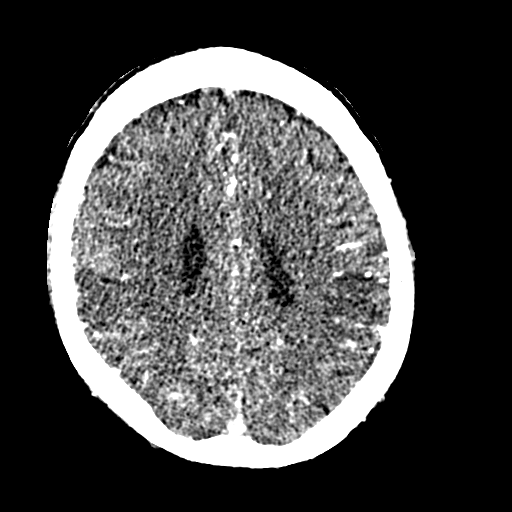
[im 105/163  bone]
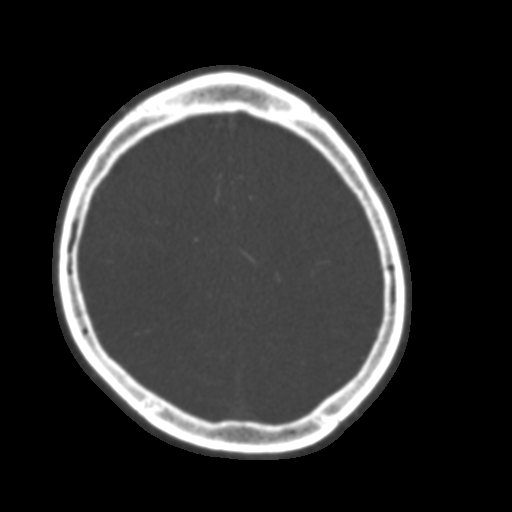
[im 116/163  brain]
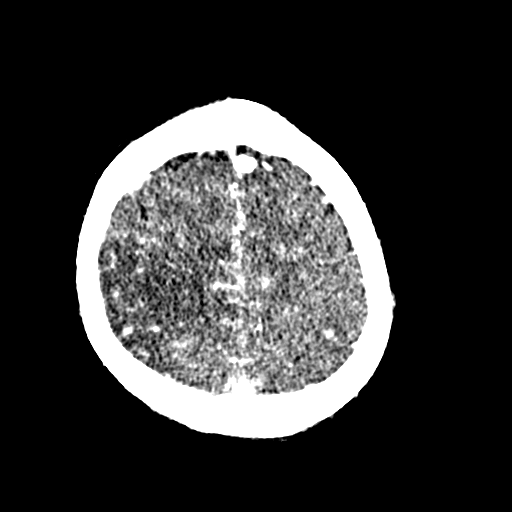
[im 128/163  bone]
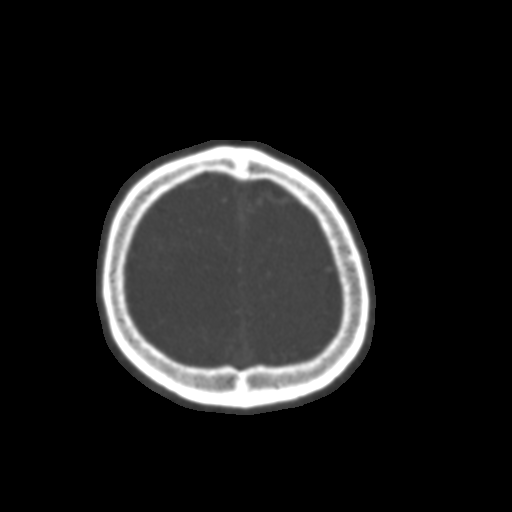
[im 139/163  brain]
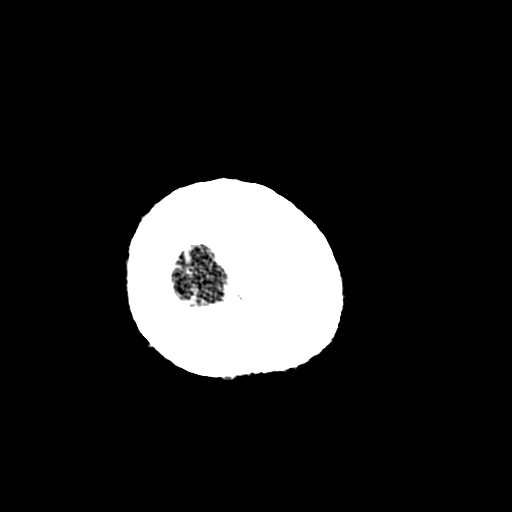
[im 151/163  bone]
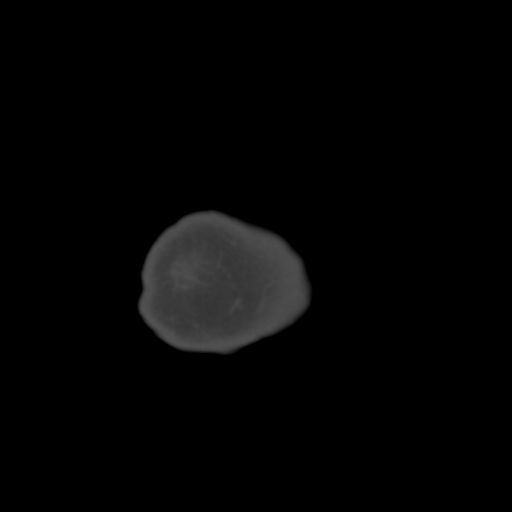

[Series 12: cor thin · coronal · 0.36mm/px · 3 of 222 slices shown]
[im 74/222  brain]
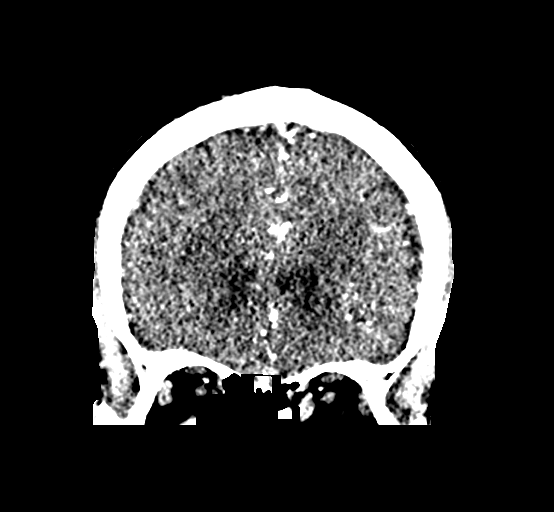
[im 111/222  brain]
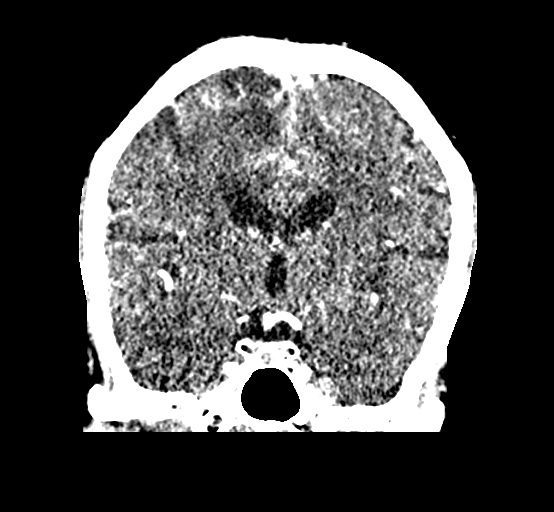
[im 148/222  brain]
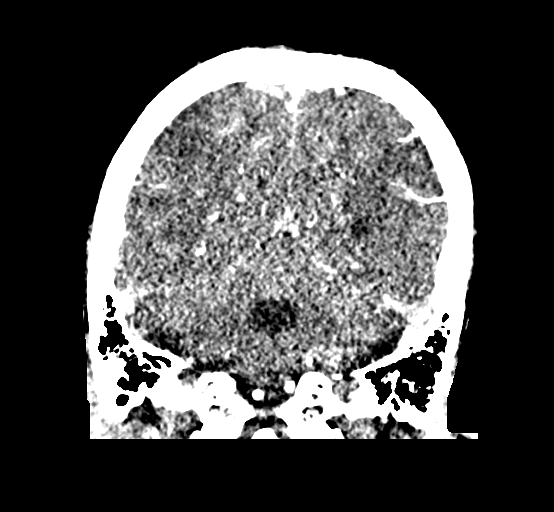

[Series 14: sag thin · sagittal · 0.35mm/px · 2 of 179 slices shown]
[im 60/179  brain]
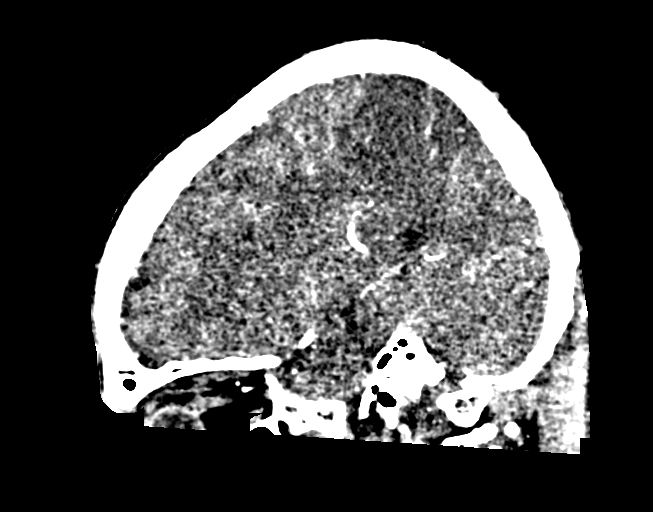
[im 119/179  brain]
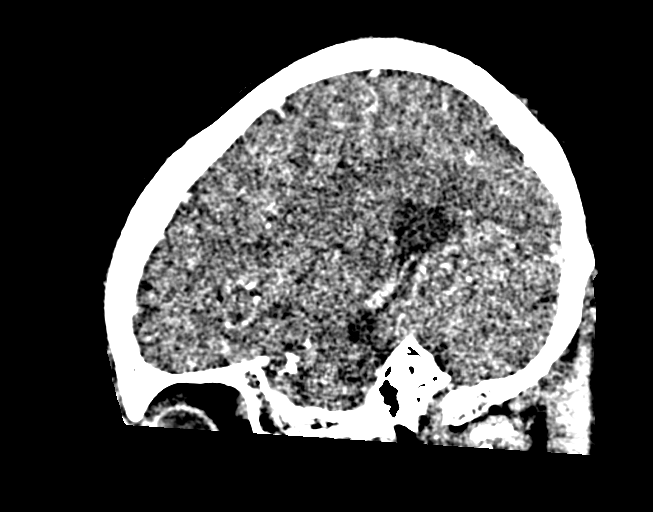

[17 of 47 positions shown; findings below may reference images not displayed]

FINDINGS: CT HEAD

Brain: Stable distribution of large right posterior MCA subacute
infarctions mild interval increase in edema and local mass effect
from the prior study. Additionally, there are punctate density in
the right paramedian high parietal region (series 4, image 27)
probably representing petechial hemorrhage. Stable chronic left
parietal and bilateral anterior basal ganglia infarctions. Stable
chronic microvascular ischemic changes and volume loss of the brain.
No extra-axial collection, hydrocephalus, or herniation.

Vascular: As below.

Skull: Normal. Negative for fracture or focal lesion.

Sinuses: Imaged portions are clear.

Orbits: No acute finding.

CTA HEAD

Anterior circulation: Calcific atherosclerosis of carotid siphons
with mild bilateral distal cavernous and paraclinoid stenosis. No
proximal large vessel occlusion, aneurysm, or high-grade stenosis.
Short segment of occlusion of a distal right MCA parietal branch
(series 12, image 117 and series 15, image 12).

Posterior circulation: Right P2 long segment of moderate occlusion.
No additional stenosis in the posterior circulation. No large vessel
occlusion, or aneurysm. No vascular malformation.

Venous sinuses: As permitted by contrast timing, patent.

Anatomic variants: None significant.

Delayed phase: Enhancing laminar necrosis of the left parietal
region of chronic infarction. No additional focus of abnormal
enhancement of the brain.
IMPRESSION: 1. Stable distribution of large right posterior MCA distribution
infarction predominantly involving the right parietal lobe. Mild
interval increase in edema and local mass effect. Petechial
hemorrhage in right superior paramedian parietal lobe.
2. Distal right MCA parietal branch occlusion.
3. No large vessel occlusion, aneurysm, or proximal high-grade
stenosis of the anterior or posterior intracranial circulation.
4. Carotid siphon calcific atherosclerosis with mild distal
cavernous and paraclinoid stenosis.
5. Right P2 long segment of moderate stenosis.

By: Cesar De Almeida Gral M.D.

## 2019-11-25 ENCOUNTER — Other Ambulatory Visit: Payer: Self-pay

## 2019-11-25 ENCOUNTER — Encounter: Payer: Self-pay | Admitting: Podiatry

## 2019-11-25 ENCOUNTER — Ambulatory Visit (INDEPENDENT_AMBULATORY_CARE_PROVIDER_SITE_OTHER): Payer: Medicaid Other | Admitting: Podiatry

## 2019-11-25 DIAGNOSIS — M79675 Pain in left toe(s): Secondary | ICD-10-CM

## 2019-11-25 DIAGNOSIS — I639 Cerebral infarction, unspecified: Secondary | ICD-10-CM

## 2019-11-25 DIAGNOSIS — B351 Tinea unguium: Secondary | ICD-10-CM | POA: Diagnosis not present

## 2019-11-25 DIAGNOSIS — M79674 Pain in right toe(s): Secondary | ICD-10-CM | POA: Diagnosis not present

## 2019-11-25 NOTE — Progress Notes (Signed)
Complaint:  Visit Type: Patient presents  to my office for continued preventative foot care services. Complaint: Patient states" my nails have grown long and thick and become painful to walk and wear shoes" Patient presents to the office with male caregiver. The patient presents for preventative foot care services.  Podiatric Exam: Vascular: dorsalis pedis and posterior tibial pulses are palpable bilateral. Capillary return is immediate. Temperature gradient is WNL. Skin turgor WNL  Sensorium: Absent Semmes Weinstein monofilament test. Absent tactile sensation bilaterally. Nail Exam: Pt has thick disfigured discolored nails with subungual debris noted bilateral entire nail hallux through fifth toenails Ulcer Exam: There is no evidence of ulcer or pre-ulcerative changes or infection. Orthopedic Exam: Muscle tone and strength are WNL. No limitations in general ROM. No crepitus or effusions noted. Foot type and digits show no abnormalities. HAV  B/L. Skin: No Porokeratosis. No infection or ulcers  Diagnosis:  Onychomycosis, , Pain in right toe, pain in left toes  Treatment & Plan Procedures and Treatment: Consent by patient was obtained for treatment procedures.   Debridement of mycotic and hypertrophic toenails, 1 through 5 bilateral and clearing of subungual debris. No ulceration, no infection noted.  Return Visit-Office Procedure: Patient instructed to return to the office for a follow up visit 3 months for continued evaluation and treatment.    Helane Gunther DPM

## 2020-03-27 ENCOUNTER — Ambulatory Visit: Payer: Medicaid Other | Admitting: Podiatry

## 2022-08-07 ENCOUNTER — Encounter: Payer: Self-pay | Admitting: *Deleted

## 2022-10-31 ENCOUNTER — Encounter: Payer: Self-pay | Admitting: *Deleted

## 2023-01-07 ENCOUNTER — Encounter: Payer: Self-pay | Admitting: *Deleted

## 2023-01-13 ENCOUNTER — Ambulatory Visit: Payer: Medicare Other | Admitting: Neurology

## 2023-03-31 ENCOUNTER — Ambulatory Visit (INDEPENDENT_AMBULATORY_CARE_PROVIDER_SITE_OTHER): Payer: Medicare Other | Admitting: Neurology

## 2023-03-31 ENCOUNTER — Encounter: Payer: Self-pay | Admitting: Neurology

## 2023-03-31 VITALS — BP 154/77 | HR 79 | Ht 74.0 in | Wt 188.0 lb

## 2023-03-31 DIAGNOSIS — R413 Other amnesia: Secondary | ICD-10-CM

## 2023-03-31 DIAGNOSIS — F1027 Alcohol dependence with alcohol-induced persisting dementia: Secondary | ICD-10-CM

## 2023-03-31 NOTE — Progress Notes (Unsigned)
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  Patient is accompanied by administrator of his current house, he has been living there since March 12, 2022.  Since being in the house, they noted that patient is forgetful, needs multiple reminders, need help with showering, basically need help with all actives of daily living.  He is not cooking but able to fix his bed. They also tell me that his memory is getting worse, he does have word finding difficulty.  He is not aggressive, he does not try to leave the house and no increased irritability. His sleep if good     Functional status: Dependent in all ADLs and IADLs Patient lives with assisted living facility. Cooking: no Cleaning: Make his bed Shopping: no Bathing: need reminder to remove clothes  Toileting: yes Driving:  Bills: no Medications: will need help  Ever left the stove on by accident?: n/a Forget how to use items around the house?: yes Getting lost going to familiar places?: yes Forgetting loved ones names?: yes Word finding difficulty? 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     amLODipine (NORVASC) 5 MG tablet Take 1 tablet (5 mg total) by mouth daily. 30 tablet 0   aspirin EC 81 MG tablet Take 81 mg by mouth daily.     atorvastatin (LIPITOR) 20 MG tablet Take 20 mg by mouth daily.     Cholecalciferol 25 MCG (1000 UT) TBDP Take by mouth.     DOCUSATE SODIUM PO Take 100 mg by mouth as needed.     pantoprazole (PROTONIX) 40 MG tablet Take 1 tablet (40 mg total) by mouth daily at 6 (six) AM. 30 tablet 0   traZODone (DESYREL) 150 MG tablet Take 150 mg by mouth at bedtime.     sertraline (ZOLOFT) 25 MG tablet Take 25 mg by mouth daily.     No facility-administered medications prior to visit.    PAST MEDICAL HISTORY: Past Medical History:  Diagnosis Date   Alcohol abuse    Hypertension    Stroke (HCC)     PAST SURGICAL HISTORY: History reviewed. No pertinent surgical history.  FAMILY HISTORY: History reviewed. No pertinent family history.  SOCIAL HISTORY: Social History   Socioeconomic History   Marital status: Single    Spouse name: Not on file   Number of children: Not on file   Years of education: Not on file   Highest education level: Not on file  Occupational History   Not on  file  Tobacco Use   Smoking status: Every Day    Types: Cigarettes   Smokeless tobacco: Never  Substance and Sexual Activity   Alcohol use: Not Currently    Comment: pt denies, EMS reports friends say he drinks regularly   Drug use: No   Sexual activity: Not on file  Other Topics Concern   Not on file  Social History Narrative   Not on file   Social Determinants of Health   Financial Resource Strain: Not on file  Food Insecurity: Not on file  Transportation Needs: Not on file  Physical Activity: Not on file  Stress:  Not on file  Social Connections: Not on file  Intimate Partner Violence: Not on file    PHYSICAL EXAM  GENERAL EXAM/CONSTITUTIONAL: Vitals:  Vitals:   03/31/23 1344  BP: (!) 154/77  Pulse: 79  Weight: 188 lb (85.3 kg)  Height: 6\' 2"  (1.88 m)   Body mass index is 24.14 kg/m. Wt Readings from Last 3 Encounters:  03/31/23 188 lb (85.3 kg)  04/30/18 147 lb 8 oz (66.9 kg)  05/04/12 160 lb (72.6 kg)   Patient is in no distress; well developed, nourished and groomed; neck is supple  MUSCULOSKELETAL: Gait, strength, tone, movements noted in Neurologic exam below  NEUROLOGIC: MENTAL STATUS:     03/31/2023    1:37 PM  MMSE - Mini Mental State Exam  Orientation to time 0  Orientation to Place 4  Registration 1  Attention/ Calculation 0  Recall 2  Language- name 2 objects 1  Language- repeat 0  Language- follow 3 step command 3  Language- read & follow direction 1  Write a sentence 0  Copy design 0  Total score 12   CRANIAL NERVE:  2nd, 3rd, 4th, 6th- visual fields full to confrontation, extraocular muscles intact, no nystagmus 5th - facial sensation symmetric 7th - facial strength symmetric 8th - hearing intact 9th - palate elevates symmetrically, uvula midline 11th - shoulder shrug symmetric 12th - tongue protrusion midline  MOTOR:  normal bulk and tone, full strength in the BUE, BLE  SENSORY:  normal and symmetric to light touch  COORDINATION:  finger-nose-finger, fine finger movements normal  GAIT/STATION:  normal   DIAGNOSTIC DATA (LABS, IMAGING, TESTING) - I reviewed patient records, labs, notes, testing and imaging myself where available.  Lab Results  Component Value Date   WBC 7.9 03/20/2018   HGB 15.8 03/20/2018   HCT 47.8 03/20/2018   MCV 96.0 03/20/2018   PLT 226 03/20/2018      Component Value Date/Time   NA 138 03/23/2018 0531   K 3.9 03/23/2018 0531   CL 102 03/23/2018 0531   CO2 28 03/23/2018 0531   GLUCOSE 103 (H) 03/23/2018  0531   BUN 13 03/23/2018 0531   CREATININE 0.91 05/01/2018 0558   CALCIUM 9.1 03/23/2018 0531   PROT 8.4 (H) 03/17/2018 1738   ALBUMIN 4.5 03/17/2018 1738   AST 33 03/17/2018 1738   ALT 21 03/17/2018 1738   ALKPHOS 93 03/17/2018 1738   BILITOT 1.1 03/17/2018 1738   GFRNONAA >60 05/01/2018 0558   GFRAA >60 05/01/2018 0558   Lab Results  Component Value Date   CHOL 115 03/18/2018   HDL 40 (L) 03/18/2018   LDLCALC 68 03/18/2018   TRIG 34 03/18/2018   CHOLHDL 2.9 03/18/2018   Lab Results  Component Value Date   HGBA1C 5.4 03/18/2018   Lab Results  Component Value Date   VITAMINB12 589 03/18/2018  Lab Results  Component Value Date   TSH 1.094 03/18/2018   MRI Brain 03/18/2018 Severely motion degraded exam. 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 Mild swelling but no evidence of hemorrhage on these limited images. Question small region of acute infarction in the inferior cerebellum on the left as well. If this is present, this would certainly elevate the likelihood of embolic disease from the heart or ascending aorta. If not a real finding, most likely source the embolus would be the right carotid bifurcation    ASSESSMENT AND PLAN  67 y.o. year old male with history of chronic alcoholic abuse in remission, dementia related to alcohol, previous stroke, hypertension, hyperlipidemia who is presenting for evaluation of his dementia.  Patient currently lives in a assisted living facility where he is dependent in all actives of daily living.  He does need reminders to take his clothes off when showering, and need help with all activities.  On exam today he scored 12 out of 30 on the MMSE indicative of severe impairment.  Patient likely have severe dementia, alcohol-related.  My plan for him is to continue current medications, I will add Namenda 10 mg twice daily.  He  will continue to follow with PCP and return as needed.   1. Severe dementia associated with alcoholism, without behavioral disturbance, psychotic disturbance, mood disturbance, or anxiety (HCC)   2. Memory loss     Patient Instructions  Continue current medications Will add Namenda 10 mg twice daily  Continue follow-up PCP Order if worse, if there is hallucinations, increase irritability or new delusions.  There are well-accepted and sensible ways to reduce risk for Alzheimers disease and other degenerative brain disorders .  Exercise Daily Walk A daily 20 minute walk should be part of your routine. Disease related apathy can be a significant roadblock to exercise and the only way to overcome this is to make it a daily routine and perhaps have a reward at the end (something your loved one loves to eat or drink perhaps) or a personal trainer coming to the home can also be very useful. Most importantly, the patient is much more likely to exercise if the caregiver / spouse does it with him/her. In general a structured, repetitive schedule is best.  General Health: Any diseases which effect your body will effect your brain such as a pneumonia, urinary infection, blood clot, heart attack or stroke. Keep contact with your primary care doctor for regular follow ups.  Sleep. A good nights sleep is healthy for the brain. Seven hours is recommended. If you have insomnia or poor sleep habits we can give you some instructions. If you have sleep apnea wear your mask.  Diet: Eating a heart healthy diet is also a good idea; fish and poultry instead of red meat, nuts (mostly non-peanuts), vegetables, fruits, olive oil or canola oil (instead of butter), minimal salt (use other spices to flavor foods), whole grain rice, bread, cereal and pasta and wine in moderation.Research is now showing that the MIND diet, which is a combination of The Mediterranean diet and the DASH diet, is beneficial for cognitive  processing and longevity. Information about this diet can be found in The MIND Diet, a book by Alonna Minium, MS, RDN, and online at [employer]  Finances, Power of [address] and Advance Directives: You should consider putting legal safeguards in place with regard to financial and medical  decision making. While the spouse always has power of attorney for medical and financial issues in the absence of any form, you should consider what you want in case the spouse / caregiver is no longer around or capable of making decisions.     No orders of the defined types were placed in this encounter.   Meds ordered this encounter  Medications   memantine (NAMENDA) 10 MG tablet    Sig: Take 1 tablet (10 mg total) by mouth 2 (two) times daily.    Dispense:  60 tablet    Refill:  11    Return if symptoms worsen or fail to improve.  I have spent a total of 60 minutes dedicated to this patient today, preparing to see patient, performing a medically appropriate examination and evaluation, ordering tests and/or medications and procedures, and counseling and educating the patient/family/caregiver; independently interpreting result and communicating results to the family/patient/caregiver; and documenting clinical information in the electronic medical record.   Windell Norfolk, MD 04/02/2023, 5:43 PM  [facility] [address] [place], [place] [address] [phone]

## 2023-04-02 MED ORDER — MEMANTINE HCL 10 MG PO TABS: 10.0000 mg | ORAL_TABLET | Freq: Two times a day (BID) | ORAL | 11 refills | Status: AC

## 2023-04-02 NOTE — Patient Instructions (Addendum)
Continue current medications Will add Namenda 10 mg twice daily  Continue follow-up PCP Order if worse, if there is hallucinations, increase irritability or new delusions.  There are well-accepted and sensible ways to reduce risk for Alzheimers disease and other degenerative brain disorders .  Exercise Daily Walk A daily 20 minute walk should be part of your routine. Disease related apathy can be a significant roadblock to exercise and the only way to overcome this is to make it a daily routine and perhaps have a reward at the end (something your loved one loves to eat or drink perhaps) or a personal trainer coming to the home can also be very useful. Most importantly, the patient is much more likely to exercise if the caregiver / spouse does it with him/her. In general a structured, repetitive schedule is best.  General Health: Any diseases which effect your body will effect your brain such as a pneumonia, urinary infection, blood clot, heart attack or stroke. Keep contact with your primary care doctor for regular follow ups.  Sleep. A good nights sleep is healthy for the brain. Seven hours is recommended. If you have insomnia or poor sleep habits we can give you some instructions. If you have sleep apnea wear your mask.  Diet: Eating a heart healthy diet is also a good idea; fish and poultry instead of red meat, nuts (mostly non-peanuts), vegetables, fruits, olive oil or canola oil (instead of butter), minimal salt (use other spices to flavor foods), whole grain rice, bread, cereal and pasta and wine in moderation.Research is now showing that the MIND diet, which is a combination of The Mediterranean diet and the DASH diet, is beneficial for cognitive processing and longevity. Information about this diet can be found in The MIND Diet, a book by Alonna Minium, MS, RDN, and online at [employer]  Finances, Power of [address] and Advance Directives: You should consider  putting legal safeguards in place with regard to financial and medical decision making. While the spouse always has power of attorney for medical and financial issues in the absence of any form, you should consider what you want in case the spouse / caregiver is no longer around or capable of making decisions.

## 2023-04-11 ENCOUNTER — Encounter: Payer: Self-pay | Admitting: *Deleted

## 2023-05-30 ENCOUNTER — Ambulatory Visit (INDEPENDENT_AMBULATORY_CARE_PROVIDER_SITE_OTHER): Payer: Medicare Other | Admitting: Internal Medicine

## 2023-05-30 ENCOUNTER — Encounter: Payer: Self-pay | Admitting: Internal Medicine

## 2023-05-30 VITALS — BP 158/84 | HR 75 | Temp 98.1°F | Ht 74.0 in | Wt 189.2 lb

## 2023-05-30 DIAGNOSIS — K5900 Constipation, unspecified: Secondary | ICD-10-CM | POA: Diagnosis not present

## 2023-05-30 DIAGNOSIS — K219 Gastro-esophageal reflux disease without esophagitis: Secondary | ICD-10-CM | POA: Diagnosis not present

## 2023-05-30 DIAGNOSIS — Z1211 Encounter for screening for malignant neoplasm of colon: Secondary | ICD-10-CM

## 2023-05-30 NOTE — Patient Instructions (Signed)
We will schedule you for colonoscopy for colon cancer screening purposes.  Continue on pantoprazole for your chronic reflux.  Continue on docusate stool softener as needed for constipation.  It was very nice meeting you both today.  Dr. Marletta Lor

## 2023-05-30 NOTE — Progress Notes (Signed)
Primary Care Physician:  Benetta Spar, MD Primary Gastroenterologist:  Dr. Marletta Lor  Chief Complaint  Patient presents with   New Patient (Initial Visit)    Pt referred for GERD. Also here for colonoscopy    HPI:   Adam Koch is a 67 y.o. male who presents the clinic today by referral from his PCP Dr. Felecia Shelling for evaluation.  He has a history of hypertension, GERD, anxiety, alcohol induced dementia, currently residing at [facility] rest home.  Accompanied by caseworker today.  Chronic GERD: Well-controlled on pantoprazole 20 mg daily.  No dysphagia odynophagia.  No epigastric or chest pain.  Colon cancer screening: No prior colonoscopy.  Denies any melena hematochezia.  No family history of colorectal malignancy.  No abdominal pain or unintentional weight loss.  Constipation: Mild, takes docusate as needed.  Well-controlled.  Past Medical History:  Diagnosis Date   Alcohol abuse    Hypertension    Stroke (HCC)     No past surgical history on file.  Current Outpatient Medications  Medication Sig Dispense Refill   acetaminophen (TYLENOL) 500 MG tablet Take 500 mg by mouth every 6 (six) hours as needed.     amLODipine (NORVASC) 5 MG tablet Take 1 tablet (5 mg total) by mouth daily. 30 tablet 0   aspirin EC 81 MG tablet Take 81 mg by mouth daily.     atorvastatin (LIPITOR) 20 MG tablet Take 20 mg by mouth daily.     Cholecalciferol 25 MCG (1000 UT) TBDP Take by mouth.     DOCUSATE SODIUM PO Take 100 mg by mouth as needed.     memantine (NAMENDA) 10 MG tablet Take 1 tablet (10 mg total) by mouth 2 (two) times daily. 60 tablet 11   pantoprazole (PROTONIX) 40 MG tablet Take 1 tablet (40 mg total) by mouth daily at 6 (six) AM. 30 tablet 0   traZODone (DESYREL) 150 MG tablet Take 150 mg by mouth at bedtime.     No current facility-administered medications for this visit.    Allergies as of 05/30/2023   (No Known Allergies)    No family history on file.  Social History    Socioeconomic History   Marital status: Single    Spouse name: Not on file   Number of children: Not on file   Years of education: Not on file   Highest education level: Not on file  Occupational History   Not on file  Tobacco Use   Smoking status: Every Day    Types: Cigarettes   Smokeless tobacco: Never  Substance and Sexual Activity   Alcohol use: Not Currently    Comment: pt denies, EMS reports friends say he drinks regularly   Drug use: No   Sexual activity: Not on file  Other Topics Concern   Not on file  Social History Narrative   Not on file   Social Determinants of Health   Financial Resource Strain: Not on file  Food Insecurity: Not on file  Transportation Needs: Not on file  Physical Activity: Not on file  Stress: Not on file  Social Connections: Not on file  Intimate Partner Violence: Not on file    Subjective: Review of Systems  Constitutional:  Negative for chills and fever.  HENT:  Negative for congestion and hearing loss.   Eyes:  Negative for blurred vision and double vision.  Respiratory:  Negative for cough and shortness of breath.   Cardiovascular:  Negative for chest pain and palpitations.  Gastrointestinal:  Negative for abdominal pain, blood in stool, constipation, diarrhea, heartburn, melena and vomiting.  Genitourinary:  Negative for dysuria and urgency.  Musculoskeletal:  Negative for joint pain and myalgias.  Skin:  Negative for itching and rash.  Neurological:  Negative for dizziness and headaches.  Psychiatric/Behavioral:  Negative for depression. The patient is not nervous/anxious.        Objective: BP (!) 154/82   Pulse 75   Temp 98.1 F (36.7 C)   Ht 6\' 2"  (1.88 m)   Wt 189 lb 3.2 oz (85.8 kg)   BMI 24.29 kg/m  Physical Exam Constitutional:      Appearance: Normal appearance.  HENT:     Head: Normocephalic and atraumatic.  Eyes:     Extraocular Movements: Extraocular movements intact.     Conjunctiva/sclera:  Conjunctivae normal.  Cardiovascular:     Rate and Rhythm: Normal rate and regular rhythm.  Pulmonary:     Effort: Pulmonary effort is normal.     Breath sounds: Normal breath sounds.  Abdominal:     General: Bowel sounds are normal.     Palpations: Abdomen is soft.  Musculoskeletal:        General: Normal range of motion.     Cervical back: Normal range of motion and neck supple.  Skin:    General: Skin is warm.  Neurological:     General: No focal deficit present.     Mental Status: He is alert and oriented to person, place, and time.  Psychiatric:        Mood and Affect: Mood normal.        Behavior: Behavior normal.      Assessment: *Chronic GERD-well-controlled on pantoprazole 20 mg daily *Colon cancer screening *Constipation  Plan: Chronic GERD well-controlled on pantoprazole.  Will continue.  Constipation well-controlled on as needed docusate.  Continue.  Will schedule for screening colonoscopy.The risks including infection, bleed, or perforation as well as benefits, limitations, alternatives and imponderables have been reviewed with the patient. Questions have been answered. All parties agreeable.  Thank you Dr. Felecia Shelling for the kind referral.  05/30/2023 9:42 AM   Disclaimer: This note was dictated with voice recognition software. Similar sounding words can inadvertently be transcribed and may not be corrected upon review.

## 2023-06-02 ENCOUNTER — Encounter: Payer: Self-pay | Admitting: Internal Medicine

## 2023-06-09 ENCOUNTER — Telehealth: Payer: Self-pay | Admitting: *Deleted

## 2023-06-09 NOTE — Telephone Encounter (Signed)
Matwaya is calling from [facility] medicine to get an update on pt Mri order. Please call [phone]

## 2023-06-10 NOTE — Telephone Encounter (Signed)
Call to [facility], reviewed last visit notes and caregiver verbalized understanding. She will call back is symptoms worsen.

## 2023-06-17 ENCOUNTER — Telehealth: Payer: Self-pay | Admitting: *Deleted

## 2023-06-17 MED ORDER — FLEET ENEMA RE ENEM: 1.0000 | ENEMA | RECTAL | 0 refills | Status: DC

## 2023-06-17 MED ORDER — BISACODYL 5 MG PO TBEC: 10.0000 mg | DELAYED_RELEASE_TABLET | ORAL | 0 refills | Status: DC

## 2023-06-17 MED ORDER — PEG 3350-KCL-NA BICARB-NACL 420 G PO SOLR: 4000.0000 mL | Freq: Once | ORAL | 0 refills | Status: AC

## 2023-06-17 NOTE — Telephone Encounter (Signed)
Spoke with Matwaya at [facility]. Patient has been scheduled for TCS with Dr. Marletta Lor, ASA 3 on 1/31 at 8:30am. She did need his arrival time to schedule transportation. She advised me to send rx to [facility] and fax instructions/pre-op to [phone]

## 2023-07-31 ENCOUNTER — Telehealth: Payer: Self-pay | Admitting: Internal Medicine

## 2023-07-31 NOTE — Telephone Encounter (Signed)
The group home the patient is at needed the prep instructions and I faxed those to her at [phone]. She also said that [facility] needed a copy? I think she probably meant the prep prescription. Can you fax that over to [facility] please. His procedure is with Marletta Lor on 08/08/2023

## 2023-08-01 MED ORDER — BISACODYL 5 MG PO TBEC: 10.0000 mg | DELAYED_RELEASE_TABLET | ORAL | 0 refills | Status: AC

## 2023-08-01 MED ORDER — PEG 3350-KCL-NA BICARB-NACL 420 G PO SOLR: 4000.0000 mL | Freq: Once | ORAL | 0 refills | Status: AC

## 2023-08-01 MED ORDER — FLEET ENEMA RE ENEM: 1.0000 | ENEMA | RECTAL | 0 refills | Status: AC

## 2023-08-01 NOTE — Patient Instructions (Signed)
Adam Koch  08/01/2023     @PREFPERIOPPHARMACY @   Your procedure is scheduled on 08/08/2023.   Report to [facility] at  [phone]  A.M.   Call this number if you have problems the morning of surgery:  [phone]  If you experience any cold or flu symptoms such as cough, fever, chills, shortness of breath, etc. between now and your scheduled surgery, please notify us at the above number.   Remember:  Follow the diet and prep instructions given to you by the office.   You may drink clear liquids until 0430 am on 08/08/2023.    Clear liquids allowed are:                    Water, Juice (No red color; non-citric and without pulp; diabetics please choose diet or no sugar options), Carbonated beverages (diabetics please choose diet or no sugar options), Clear Tea (No creamer, milk, or cream, including half & half and powdered creamer), Black Coffee Only (No creamer, milk or cream, including half & half and powdered creamer), and Clear Sports drink (No red color; diabetics please choose diet or no sugar options)    Take these medicines the morning of surgery with A SIP OF WATER               amlodipine, memantine, pantoprazole, sertraline.     Do not wear jewelry, make-up or nail polish, including gel polish,  artificial nails, or any other type of covering on natural nails (fingers and  toes).  Do not wear lotions, powders, or perfumes, or deodorant.  Do not shave 48 hours prior to surgery.  Men may shave face and neck.  Do not bring valuables to the hospital.  [facility] is not responsible for any belongings or valuables.  Contacts, dentures or bridgework may not be worn into surgery.  Leave your suitcase in the car.  After surgery it may be brought to your room.  For patients admitted to the hospital, discharge time will be determined by your treatment team.  Patients discharged the day of surgery will not be allowed to drive home and must have someone with them for 24  hours.    Special instructions:   DO NOT smoke tobacco or vape for 24 hours before your procedure.  Please read over the following fact sheets that you were given. Anesthesia Post-op Instructions and Care and Recovery After Surgery      Colonoscopy, Adult, Care After The following information offers guidance on how to care for yourself after your procedure. Your health care provider may also give you more specific instructions. If you have problems or questions, contact your health care provider. What can I expect after the procedure? After the procedure, it is common to have: A small amount of blood in your stool for 24 hours after the procedure. Some gas. Mild cramping or bloating of your abdomen. Follow these instructions at home: Eating and drinking  Drink enough fluid to keep your urine pale yellow. Follow instructions from your health care provider about eating or drinking restrictions. Resume your normal diet as told by your health care provider. Avoid heavy or fried foods that are hard to digest. Activity Rest as told by your health care provider. Avoid sitting for a long time without moving. Get up to take short walks every 1-2 hours. This is important to improve blood flow and breathing. Ask for help if you feel weak or unsteady.  Return to your normal activities as told by your health care provider. Ask your health care provider what activities are safe for you. Managing cramping and bloating  Try walking around when you have cramps or feel bloated. If directed, apply heat to your abdomen as told by your health care provider. Use the heat source that your health care provider recommends, such as a moist heat pack or a heating pad. Place a towel between your skin and the heat source. Leave the heat on for 20-30 minutes. Remove the heat if your skin turns bright red. This is especially important if you are unable to feel pain, heat, or cold. You have a greater risk of getting  burned. General instructions If you were given a sedative during the procedure, it can affect you for several hours. Do not drive or operate machinery until your health care provider says that it is safe. For the first 24 hours after the procedure: Do not sign important documents. Do not drink alcohol. Do your regular daily activities at a slower pace than normal. Eat soft foods that are easy to digest. Take over-the-counter and prescription medicines only as told by your health care provider. Keep all follow-up visits. This is important. Contact a health care provider if: You have blood in your stool 2-3 days after the procedure. Get help right away if: You have more than a small spotting of blood in your stool. You have large blood clots in your stool. You have swelling of your abdomen. You have nausea or vomiting. You have a fever. You have increasing pain in your abdomen that is not relieved with medicine. These symptoms may be an emergency. Get help right away. Call 911. Do not wait to see if the symptoms will go away. Do not drive yourself to the hospital. Summary After the procedure, it is common to have a small amount of blood in your stool. You may also have mild cramping and bloating of your abdomen. If you were given a sedative during the procedure, it can affect you for several hours. Do not drive or operate machinery until your health care provider says that it is safe. Get help right away if you have a lot of blood in your stool, nausea or vomiting, a fever, or increased pain in your abdomen. This information is not intended to replace advice given to you by your health care provider. Make sure you discuss any questions you have with your health care provider. Document Revised: 08/06/2022 Document Reviewed: 02/14/2021 Elsevier Patient Education  2024 [employer].Monitored Anesthesia Care, Care After The following information offers guidance on how to care for yourself  after your procedure. Your health care provider may also give you more specific instructions. If you have problems or questions, contact your health care provider. What can I expect after the procedure? After the procedure, it is common to have: Tiredness. Little or no memory about what happened during or after the procedure. Impaired judgment when it comes to making decisions. Nausea or vomiting. Some trouble with balance. Follow these instructions at home: For the time period you were told by your health care provider:  Rest. Do not participate in activities where you could fall or become injured. Do not drive or use machinery. Do not drink alcohol. Do not take sleeping pills or medicines that cause drowsiness. Do not make important decisions or sign legal documents. Do not take care of children on your own. Medicines Take over-the-counter and prescription medicines only as told  by your health care provider. If you were prescribed antibiotics, take them as told by your health care provider. Do not stop using the antibiotic even if you start to feel better. Eating and drinking Follow instructions from your health care provider about what you may eat and drink. Drink enough fluid to keep your urine pale yellow. If you vomit: Drink clear fluids slowly and in small amounts as you are able. Clear fluids include water, ice chips, low-calorie sports drinks, and fruit juice that has water added to it (diluted fruit juice). Eat light and bland foods in small amounts as you are able. These foods include bananas, applesauce, rice, lean meats, toast, and crackers. General instructions  Have a responsible adult stay with you for the time you are told. It is important to have someone help care for you until you are awake and alert. If you have sleep apnea, surgery and some medicines can increase your risk for breathing problems. Follow instructions from your health care provider about wearing your  sleep device: When you are sleeping. This includes during daytime naps. While taking prescription pain medicines, sleeping medicines, or medicines that make you drowsy. Do not use any products that contain nicotine or tobacco. These products include cigarettes, chewing tobacco, and vaping devices, such as e-cigarettes. If you need help quitting, ask your health care provider. Contact a health care provider if: You feel nauseous or vomit every time you eat or drink. You feel light-headed. You are still sleepy or having trouble with balance after 24 hours. You get a rash. You have a fever. You have redness or swelling around the IV site. Get help right away if: You have trouble breathing. You have new confusion after you get home. These symptoms may be an emergency. Get help right away. Call 911. Do not wait to see if the symptoms will go away. Do not drive yourself to the hospital. This information is not intended to replace advice given to you by your health care provider. Make sure you discuss any questions you have with your health care provider. Document Revised: 11/19/2021 Document Reviewed: 11/19/2021 Elsevier Patient Education  2024 [employer].

## 2023-08-01 NOTE — Telephone Encounter (Signed)
Rx was sent 12/10 to [facility]. Resent again

## 2023-08-04 ENCOUNTER — Encounter (HOSPITAL_COMMUNITY): Payer: Self-pay

## 2023-08-04 ENCOUNTER — Encounter (HOSPITAL_COMMUNITY)
Admission: RE | Admit: 2023-08-04 | Discharge: 2023-08-04 | Disposition: A | Discharge status: Home / Self Care | Payer: Medicare Other | Source: Ambulatory Visit | Attending: Internal Medicine | Admitting: Internal Medicine

## 2023-08-04 VITALS — BP 163/84 | HR 74 | Temp 97.9°F | Resp 18 | Ht 74.0 in | Wt 189.2 lb

## 2023-08-04 DIAGNOSIS — F17200 Nicotine dependence, unspecified, uncomplicated: Secondary | ICD-10-CM | POA: Insufficient documentation

## 2023-08-04 DIAGNOSIS — F1010 Alcohol abuse, uncomplicated: Secondary | ICD-10-CM | POA: Diagnosis not present

## 2023-08-04 DIAGNOSIS — Z01812 Encounter for preprocedural laboratory examination: Secondary | ICD-10-CM | POA: Diagnosis present

## 2023-08-04 DIAGNOSIS — D72829 Elevated white blood cell count, unspecified: Secondary | ICD-10-CM

## 2023-08-04 DIAGNOSIS — I10 Essential (primary) hypertension: Secondary | ICD-10-CM

## 2023-08-04 DIAGNOSIS — Z720 Tobacco use: Secondary | ICD-10-CM

## 2023-08-04 LAB — COMPREHENSIVE METABOLIC PANEL
ALT: 33 U/L (ref 0–44)
AST: 25 U/L (ref 15–41)
Albumin: 4.1 g/dL (ref 3.5–5.0)
Alkaline Phosphatase: 86 U/L (ref 38–126)
Anion gap: 7 (ref 5–15)
BUN: 13 mg/dL (ref 8–23)
CO2: 25 mmol/L (ref 22–32)
Calcium: 9.3 mg/dL (ref 8.9–10.3)
Chloride: 103 mmol/L (ref 98–111)
Creatinine, Ser: 0.93 mg/dL (ref 0.61–1.24)
GFR, Estimated: >60 mL/min (ref >60)
Glucose, Bld: 94 mg/dL (ref 70–99)
Potassium: 4.1 mmol/L (ref 3.5–5.1)
Sodium: 135 mmol/L (ref 135–145)
Total Bilirubin: 0.5 mg/dL (ref 0.0–1.2)
Total Protein: 8 g/dL (ref 6.5–8.1)

## 2023-08-04 LAB — CBC WITH DIFFERENTIAL/PLATELET
Abs Immature Granulocytes: 0.01 10*3/uL (ref 0.00–0.07)
Basophils Absolute: 0.1 10*3/uL (ref 0.0–0.1)
Basophils Relative: 1 %
Eosinophils Absolute: 0.4 10*3/uL (ref 0.0–0.5)
Eosinophils Relative: 4 %
HCT: 46 % (ref 39.0–52.0)
Hemoglobin: 14.8 g/dL (ref 13.0–17.0)
Immature Granulocytes: 0 %
Lymphocytes Relative: 40 %
Lymphs Abs: 3.7 10*3/uL (ref 0.7–4.0)
MCH: 28.7 pg (ref 26.0–34.0)
MCHC: 32.2 g/dL (ref 30.0–36.0)
MCV: 89.3 fL (ref 80.0–100.0)
Monocytes Absolute: 0.8 10*3/uL (ref 0.1–1.0)
Monocytes Relative: 9 %
Neutro Abs: 4.2 10*3/uL (ref 1.7–7.7)
Neutrophils Relative %: 46 %
Platelets: 275 10*3/uL (ref 150–400)
RBC: 5.15 MIL/uL (ref 4.22–5.81)
RDW: 13.8 % (ref 11.5–15.5)
WBC: 9.2 10*3/uL (ref 4.0–10.5)
nRBC: 0 % (ref 0.0–0.2)

## 2023-08-08 ENCOUNTER — Encounter (HOSPITAL_COMMUNITY)
Admission: RE | Disposition: A | Discharge status: Home / Self Care | Payer: Self-pay | Source: Ambulatory Visit | Attending: Internal Medicine

## 2023-08-08 ENCOUNTER — Other Ambulatory Visit: Payer: Self-pay

## 2023-08-08 ENCOUNTER — Ambulatory Visit (HOSPITAL_COMMUNITY): Payer: Medicare Other | Admitting: Certified Registered Nurse Anesthetist

## 2023-08-08 ENCOUNTER — Ambulatory Visit (HOSPITAL_COMMUNITY)
Admission: RE | Admit: 2023-08-08 | Discharge: 2023-08-08 | Disposition: A | Discharge status: Home / Self Care | Payer: Medicare Other | Source: Ambulatory Visit | Attending: Internal Medicine | Admitting: Internal Medicine

## 2023-08-08 ENCOUNTER — Encounter (HOSPITAL_COMMUNITY): Payer: Self-pay | Admitting: Internal Medicine

## 2023-08-08 DIAGNOSIS — K648 Other hemorrhoids: Secondary | ICD-10-CM

## 2023-08-08 DIAGNOSIS — F17210 Nicotine dependence, cigarettes, uncomplicated: Secondary | ICD-10-CM | POA: Diagnosis not present

## 2023-08-08 DIAGNOSIS — I10 Essential (primary) hypertension: Secondary | ICD-10-CM | POA: Diagnosis not present

## 2023-08-08 DIAGNOSIS — Z1211 Encounter for screening for malignant neoplasm of colon: Secondary | ICD-10-CM

## 2023-08-08 DIAGNOSIS — D124 Benign neoplasm of descending colon: Secondary | ICD-10-CM

## 2023-08-08 DIAGNOSIS — Z8673 Personal history of transient ischemic attack (TIA), and cerebral infarction without residual deficits: Secondary | ICD-10-CM | POA: Diagnosis not present

## 2023-08-08 HISTORY — PX: COLONOSCOPY WITH PROPOFOL: SHX5780

## 2023-08-08 HISTORY — PX: POLYPECTOMY: SHX149

## 2023-08-08 SURGERY — COLONOSCOPY WITH PROPOFOL
Anesthesia: General

## 2023-08-08 MED ORDER — PROPOFOL 500 MG/50ML IV EMUL
INTRAVENOUS | Status: DC | PRN
  Administered 2023-08-08: 150 ug/kg/min via INTRAVENOUS
  Administered 2023-08-08: 80 mg via INTRAVENOUS
  Administered 2023-08-08: 20 mg via INTRAVENOUS

## 2023-08-08 MED ORDER — LACTATED RINGERS IV SOLN: INTRAVENOUS | Status: DC | PRN

## 2023-08-08 NOTE — H&P (Signed)
Primary Care Physician:  Benetta Spar, MD Primary Gastroenterologist:  Dr. Marletta Lor  Pre-Procedure History & Physical: HPI:  Adam Koch is a 68 y.o. male is here for a colonoscopy for colon cancer screening purposes.   Past Medical History:  Diagnosis Date   Alcohol abuse    Hypertension    Stroke [facility])     History reviewed. No pertinent surgical history.  Prior to Admission medications   Medication Sig Start Date End Date Taking? Authorizing Provider  amLODipine (NORVASC) 5 MG tablet Take 1 tablet (5 mg total) by mouth daily. Patient taking differently: Take 10 mg by mouth daily. 05/05/18 02/24/24 Yes Shah, Pratik D, DO  aspirin EC 81 MG tablet Take 81 mg by mouth daily.   Yes [provider]  atorvastatin (LIPITOR) 20 MG tablet Take 20 mg by mouth daily. 03/17/23  Yes [provider]  bisacodyl 5 MG EC tablet Take 2 tablets (10 mg total) by mouth as directed. 08/01/23  Yes Lanelle Bal, DO  Cholecalciferol 25 MCG (1000 UT) TBDP Take 2,000 Units by mouth.   Yes [provider]  DOCUSATE SODIUM PO Take 100 mg by mouth as needed.   Yes [provider]  memantine (NAMENDA) 10 MG tablet Take 1 tablet (10 mg total) by mouth 2 (two) times daily. 04/02/23 03/27/24 Yes Camara, Amalia Hailey, MD  pantoprazole (PROTONIX) 40 MG tablet Take 1 tablet (40 mg total) by mouth daily at 6 (six) AM. Patient taking differently: Take 40 mg by mouth every other day. 05/05/18 02/16/24 Yes Shah, Pratik D, DO  sertraline (ZOLOFT) 50 MG tablet Take 50 mg by mouth daily.   Yes [provider]  traZODone (DESYREL) 150 MG tablet Take 150 mg by mouth at bedtime.   Yes [provider]  acetaminophen (TYLENOL) 500 MG tablet Take 500 mg by mouth every 6 (six) hours as needed.    [provider]  sodium phosphate (FLEET) ENEM Place 133 mLs (1 enema total) rectally as directed. 08/01/23   Lanelle Bal, DO    Allergies as of 06/17/2023   (No Known  Allergies)    History reviewed. No pertinent family history.  Social History   Socioeconomic History   Marital status: Single    Spouse name: Not on file   Number of children: Not on file   Years of education: Not on file   Highest education level: Not on file  Occupational History   Not on file  Tobacco Use   Smoking status: Every Day    Types: Cigarettes   Smokeless tobacco: Never  Substance and Sexual Activity   Alcohol use: Not Currently    Comment: pt denies, EMS reports friends say he drinks regularly   Drug use: No   Sexual activity: Not on file  Other Topics Concern   Not on file  Social History Narrative   Not on file   Social Drivers of Health   Financial Resource Strain: Not on file  Food Insecurity: Not on file  Transportation Needs: Not on file  Physical Activity: Not on file  Stress: Not on file  Social Connections: Not on file  Intimate Partner Violence: Not on file    Review of Systems: See HPI, otherwise negative ROS  Physical Exam: Vital signs in last 24 hours: Temp:  [97.6 F (36.4 C)] 97.6 F (36.4 C) (01/31 0808) Pulse Rate:  [63] 63 (01/31 0808) Resp:  [12] 12 (01/31 0808) BP: (162)/(81) 162/81 (01/31 0808) SpO2:  [  100 %] 100 % (01/31 0808) Weight:  [85.8 kg] 85.8 kg (01/31 0808)   General:   Alert,  Well-developed, well-nourished, pleasant and cooperative in NAD Head:  Normocephalic and atraumatic. Eyes:  Sclera clear, no icterus.   Conjunctiva pink. Ears:  Normal auditory acuity. Nose:  No deformity, discharge,  or lesions. Msk:  Symmetrical without gross deformities. Normal posture. Extremities:  Without clubbing or edema. Neurologic:  Alert and  oriented x4;  grossly normal neurologically. Skin:  Intact without significant lesions or rashes. Psych:  Alert and cooperative. Normal mood and affect.  Impression/Plan: Adam Koch is here for a colonoscopy to be performed for colon cancer screening purposes.  The risks of the  procedure including infection, bleed, or perforation as well as benefits, limitations, alternatives and imponderables have been reviewed with the patient. Questions have been answered. All parties agreeable.

## 2023-08-08 NOTE — Anesthesia Preprocedure Evaluation (Signed)
Anesthesia Evaluation  Patient identified by MRN, date of birth, ID band Patient awake    Reviewed: Allergy & Precautions, H&P , NPO status , Patient's Chart, lab work & pertinent test results, reviewed documented beta blocker date and time   Airway Mallampati: II  TM Distance: >3 FB Neck ROM: full    Dental no notable dental hx.    Pulmonary neg pulmonary ROS, Current Smoker and Patient abstained from smoking.   Pulmonary exam normal breath sounds clear to auscultation       Cardiovascular Exercise Tolerance: Good hypertension, negative cardio ROS  Rhythm:regular Rate:Normal     Neuro/Psych CVA negative neurological ROS  negative psych ROS   GI/Hepatic negative GI ROS, Neg liver ROS,,,  Endo/Other  negative endocrine ROS    Renal/GU negative Renal ROS  negative genitourinary   Musculoskeletal   Abdominal   Peds  Hematology negative hematology ROS (+)   Anesthesia Other Findings   Reproductive/Obstetrics negative OB ROS                             Anesthesia Physical Anesthesia Plan  ASA: 3  Anesthesia Plan: General   Post-op Pain Management:    Induction:   PONV Risk Score and Plan: Propofol infusion  Airway Management Planned:   Additional Equipment:   Intra-op Plan:   Post-operative Plan:   Informed Consent: I have reviewed the patients History and Physical, chart, labs and discussed the procedure including the risks, benefits and alternatives for the proposed anesthesia with the patient or authorized representative who has indicated his/her understanding and acceptance.     Dental Advisory Given  Plan Discussed with: CRNA  Anesthesia Plan Comments:        Anesthesia Quick Evaluation

## 2023-08-08 NOTE — Op Note (Signed)
[facility] Patient Name: Adam Koch Procedure Date: 08/08/2023 8:36 AM MRN: [id number] Date of Birth: 1955-11-13 Attending MD: Hennie Duos. Marletta Lor , [place], [id number] CSN: [id number] Age: 68 Admit Type: Outpatient Procedure:                Colonoscopy Indications:              Screening for colorectal malignant neoplasm Providers:                Hennie Duos. Marletta Lor, DO, Crystal Page, Dyann Ruddle Referring MD:              Medicines:                See the Anesthesia note for documentation of the                            administered medications Complications:            No immediate complications. Estimated Blood Loss:     Estimated blood loss was minimal. Procedure:                Pre-Anesthesia Assessment:                           - The anesthesia plan was to use monitored                            anesthesia care (MAC).                           After obtaining informed consent, the colonoscope                            was passed under direct vision. Throughout the                            procedure, the patient's blood pressure, pulse, and                            oxygen saturations were monitored continuously. The                            [id number]-HQ190L ([id number]) scope was introduced through                            the anus and advanced to the the cecum, identified                            by appendiceal orifice and ileocecal valve. The                            colonoscopy was performed without difficulty. The                            patient tolerated the procedure well. The quality  of the bowel preparation was evaluated using the                            BBPS [facility] Bowel Preparation Scale) with scores                            of: Right Colon = 2 (minor amount of residual                            staining, small fragments of stool and/or opaque                            liquid, but mucosa seen well), Transverse Colon = 2                             (minor amount of residual staining, small fragments                            of stool and/or opaque liquid, but mucosa seen                            well) and Left Colon = 3 (entire mucosa seen well                            with no residual staining, small fragments of stool                            or opaque liquid). The total BBPS score equals 7.                            The quality of the bowel preparation was good. Scope In: 8:50:47 AM Scope Out: 9:04:39 AM Scope Withdrawal Time: 0 hours 10 minutes 51 seconds  Total Procedure Duration: 0 hours 13 minutes 52 seconds  Findings:      Non-bleeding internal hemorrhoids were found during endoscopy.      A 7 mm polyp was found in the descending colon. The polyp was sessile.       The polyp was removed with a cold snare. Resection and retrieval were       complete.      The exam was otherwise without abnormality. Impression:               - Non-bleeding internal hemorrhoids.                           - One 7 mm polyp in the descending colon, removed                            with a cold snare. Resected and retrieved.                           - The examination was otherwise normal. Moderate Sedation:      Per Anesthesia Care Recommendation:           -  Patient has a contact number available for                            emergencies. The signs and symptoms of potential                            delayed complications were discussed with the                            patient. Return to normal activities tomorrow.                            Written discharge instructions were provided to the                            patient.                           - Resume previous diet.                           - Continue present medications.                           - Await pathology results.                           - Repeat colonoscopy in 7 years for surveillance.                           - Return to GI  clinic PRN. Procedure Code(s):        --- Professional ---                           [phone], Colonoscopy, flexible; with removal of                            tumor(s), polyp(s), or other lesion(s) by snare                            technique Diagnosis Code(s):        --- Professional ---                           Z12.11, Encounter for screening for malignant                            neoplasm of colon                           D12.4, Benign neoplasm of descending colon                           K64.8, Other hemorrhoids CPT copyright 2022 American Medical Association. All rights reserved. The codes documented in this report are preliminary and upon coder review may  be revised to meet current compliance requirements. Hennie Duos. Marletta Lor,  DO Hennie Duos. Marletta Lor, DO 08/08/2023 9:07:37 AM This report has been signed electronically. Number of Addenda: 0

## 2023-08-08 NOTE — Transfer of Care (Signed)
Immediate Anesthesia Transfer of Care Note  Patient: Adam Koch  Procedure(s) Performed: COLONOSCOPY WITH PROPOFOL POLYPECTOMY INTESTINAL  Patient Location: Short Stay  Anesthesia Type:General  Level of Consciousness: drowsy  Airway & Oxygen Therapy: Patient Spontanous Breathing  Post-op Assessment: Report given to RN and Post -op Vital signs reviewed and stable  Post vital signs: Reviewed and stable  Last Vitals:  Vitals Value Taken Time  BP 102/71 08/08/23 0910  Temp 36.3 C 08/08/23 0910  Pulse 70 08/08/23 0910  Resp 12 08/08/23 0910  SpO2 99 % 08/08/23 0910    Last Pain:  Vitals:   08/08/23 0910  TempSrc: Axillary  PainSc: 0-No pain         Complications: No notable events documented.

## 2023-08-08 NOTE — Anesthesia Postprocedure Evaluation (Signed)
Anesthesia Post Note  Patient: Adam Koch  Procedure(s) Performed: COLONOSCOPY WITH PROPOFOL POLYPECTOMY INTESTINAL  Patient location during evaluation: Phase II Anesthesia Type: General Level of consciousness: awake Pain management: pain level controlled Vital Signs Assessment: post-procedure vital signs reviewed and stable Respiratory status: spontaneous breathing and respiratory function stable Cardiovascular status: blood pressure returned to baseline and stable Postop Assessment: no headache and no apparent nausea or vomiting Anesthetic complications: no Comments: Late entry   No notable events documented.   Last Vitals:  Vitals:   08/08/23 0808 08/08/23 0910  BP: (!) 162/81 102/71  Pulse: 63 70  Resp: 12 12  Temp: 36.4 C (!) 36.3 C  SpO2: 100% 99%    Last Pain:  Vitals:   08/08/23 0910  TempSrc: Axillary  PainSc: 0-No pain                 Windell Norfolk

## 2023-08-08 NOTE — Discharge Instructions (Addendum)
  Colonoscopy Discharge Instructions  Read the instructions outlined below and refer to this sheet in the next few weeks. These discharge instructions provide you with general information on caring for yourself after you leave the hospital. Your doctor may also give you specific instructions. While your treatment has been planned according to the most current medical practices available, unavoidable complications occasionally occur.   ACTIVITY You may resume your regular activity, but move at a slower pace for the next 24 hours.  Take frequent rest periods for the next 24 hours.  Walking will help get rid of the air and reduce the bloated feeling in your belly (abdomen).  No driving for 24 hours (because of the medicine (anesthesia) used during the test).   Do not sign any important legal documents or operate any machinery for 24 hours (because of the anesthesia used during the test).  NUTRITION Drink plenty of fluids.  You may resume your normal diet as instructed by your doctor.  Begin with a light meal and progress to your normal diet. Heavy or fried foods are harder to digest and may make you feel sick to your stomach (nauseated).  Avoid alcoholic beverages for 24 hours or as instructed.  MEDICATIONS You may resume your normal medications unless your doctor tells you otherwise.  WHAT YOU CAN EXPECT TODAY Some feelings of bloating in the abdomen.  Passage of more gas than usual.  Spotting of blood in your stool or on the toilet paper.  IF YOU HAD POLYPS REMOVED DURING THE COLONOSCOPY: No aspirin products for 7 days or as instructed.  No alcohol for 7 days or as instructed.  Eat a soft diet for the next 24 hours.  FINDING OUT THE RESULTS OF YOUR TEST Not all test results are available during your visit. If your test results are not back during the visit, make an appointment with your caregiver to find out the results. Do not assume everything is normal if you have not heard from your  caregiver or the medical facility. It is important for you to follow up on all of your test results.  SEEK IMMEDIATE MEDICAL ATTENTION IF: You have more than a spotting of blood in your stool.  Your belly is swollen (abdominal distention).  You are nauseated or vomiting.  You have a temperature over 101.  You have abdominal pain or discomfort that is severe or gets worse throughout the day.   Your colonoscopy revealed 1 polyp(s) which I removed successfully. Await pathology results, my office will contact you. I recommend repeating colonoscopy in 7 years for surveillance purposes.   Otherwise follow up with GI as needed.   I hope you have a great rest of your week!  Charles K. Carver, D.O. Gastroenterology and Hepatology [facility]  

## 2023-08-08 NOTE — Progress Notes (Signed)
Chance Chalmers Guest guardian notified and given d/c insstuction

## 2023-08-11 ENCOUNTER — Encounter (HOSPITAL_COMMUNITY): Payer: Self-pay | Admitting: Internal Medicine

## 2023-08-11 LAB — SURGICAL PATHOLOGY

## 2023-09-25 ENCOUNTER — Other Ambulatory Visit (HOSPITAL_COMMUNITY)
Admission: RE | Admit: 2023-09-25 | Discharge: 2023-09-25 | Disposition: A | Discharge status: Home / Self Care | Source: Ambulatory Visit | Attending: Internal Medicine | Admitting: Internal Medicine

## 2023-09-25 DIAGNOSIS — Z0001 Encounter for general adult medical examination with abnormal findings: Secondary | ICD-10-CM | POA: Insufficient documentation

## 2023-09-25 DIAGNOSIS — Z8673 Personal history of transient ischemic attack (TIA), and cerebral infarction without residual deficits: Secondary | ICD-10-CM | POA: Diagnosis not present

## 2023-09-25 DIAGNOSIS — I10 Essential (primary) hypertension: Secondary | ICD-10-CM | POA: Insufficient documentation

## 2023-09-25 DIAGNOSIS — E781 Pure hyperglyceridemia: Secondary | ICD-10-CM | POA: Diagnosis not present

## 2023-09-25 DIAGNOSIS — K219 Gastro-esophageal reflux disease without esophagitis: Secondary | ICD-10-CM | POA: Insufficient documentation

## 2023-09-25 DIAGNOSIS — Z1159 Encounter for screening for other viral diseases: Secondary | ICD-10-CM | POA: Diagnosis present

## 2023-09-25 LAB — CBC WITH DIFFERENTIAL/PLATELET
Abs Immature Granulocytes: 0.02 10*3/uL (ref 0.00–0.07)
Basophils Absolute: 0.1 10*3/uL (ref 0.0–0.1)
Basophils Relative: 1 %
Eosinophils Absolute: 0.3 10*3/uL (ref 0.0–0.5)
Eosinophils Relative: 3 %
HCT: 48.5 % (ref 39.0–52.0)
Hemoglobin: 15.7 g/dL (ref 13.0–17.0)
Immature Granulocytes: 0 %
Lymphocytes Relative: 34 %
Lymphs Abs: 3.3 10*3/uL (ref 0.7–4.0)
MCH: 28.7 pg (ref 26.0–34.0)
MCHC: 32.4 g/dL (ref 30.0–36.0)
MCV: 88.7 fL (ref 80.0–100.0)
Monocytes Absolute: 1 10*3/uL (ref 0.1–1.0)
Monocytes Relative: 10 %
Neutro Abs: 5 10*3/uL (ref 1.7–7.7)
Neutrophils Relative %: 52 %
Platelets: 267 10*3/uL (ref 150–400)
RBC: 5.47 MIL/uL (ref 4.22–5.81)
RDW: 13.6 % (ref 11.5–15.5)
WBC: 9.7 10*3/uL (ref 4.0–10.5)
nRBC: 0 % (ref 0.0–0.2)

## 2023-09-25 LAB — HEPATIC FUNCTION PANEL
ALT: 37 U/L (ref 0–44)
AST: 30 U/L (ref 15–41)
Albumin: 4.2 g/dL (ref 3.5–5.0)
Alkaline Phosphatase: 101 U/L (ref 38–126)
Bilirubin, Direct: 0.1 mg/dL (ref 0.0–0.2)
Indirect Bilirubin: 0.2 mg/dL — ABNORMAL LOW (ref 0.3–0.9)
Total Bilirubin: 0.3 mg/dL (ref 0.0–1.2)
Total Protein: 8.3 g/dL — ABNORMAL HIGH (ref 6.5–8.1)

## 2023-09-25 LAB — BASIC METABOLIC PANEL
Anion gap: 12 (ref 5–15)
BUN: 16 mg/dL (ref 8–23)
CO2: 24 mmol/L (ref 22–32)
Calcium: 9.5 mg/dL (ref 8.9–10.3)
Chloride: 103 mmol/L (ref 98–111)
Creatinine, Ser: 1.09 mg/dL (ref 0.61–1.24)
GFR, Estimated: >60 mL/min (ref >60)
Glucose, Bld: 102 mg/dL — ABNORMAL HIGH (ref 70–99)
Potassium: 4 mmol/L (ref 3.5–5.1)
Sodium: 139 mmol/L (ref 135–145)

## 2023-09-25 LAB — HEPATITIS B SURFACE ANTIGEN: Hepatitis B Surface Ag: NONREACTIVE

## 2023-09-25 LAB — LIPID PANEL
Cholesterol: 88 mg/dL (ref 0–200)
HDL: 27 mg/dL — ABNORMAL LOW (ref >40)
LDL Cholesterol: 24 mg/dL (ref 0–99)
Total CHOL/HDL Ratio: 3.3 ratio
Triglycerides: 186 mg/dL — ABNORMAL HIGH (ref <150)
VLDL: 37 mg/dL (ref 0–40)

## 2023-09-26 LAB — HCV RNA QUANT
HCV Quantitative Log: 6.358 {Log_IU}/mL (ref >1.70)
HCV Quantitative: 2280000 [IU]/mL (ref >50)

## 2024-01-24 NOTE — Progress Notes (Unsigned)
 Referring Provider: Fanta, Tesfaye Demissie* Primary Care Physician:  Fanta, Tesfaye Demissie, MD Primary GI Physician: Dr. Cindie  Chief Complaint  Patient presents with   Follow-up    Follow up.    HPI:   Adam Koch is a 68 y.o. male presenting at the request of Dr. Carlette for chronic hep C.  Reviewed labs available dated 09/25/2023.  LFTs, platelets, kidney function, electrolytes within normal limits.  HCVRNA 2,280,000.  Hep B surface antigen nonreactive.  Today: Presents today with his legal guardian, Chance Pierre.  Chance states they incidentally found out the patient had hep C prior to cataract surgery.  Patient reports he is doing well.  Discussed that he has hep C.  States he did not know that he had hep C.  He does have history of intranasal drug use in the distant past and is also received some small tattoos in jail.  Reports no GI concerns.  Denies abdominal pain, constipation, diarrhea, nausea, vomiting.  Denies GERD symptoms on pantoprazole  40 mg daily.  No dysphagia.   Colonoscopy 08/08/23: - Non- bleeding internal hemorrhoids. - One 7 mm polyp in the descending colon, removed with a cold snare. Resected and retrieved. - The examination was otherwise normal. - Pathology with tubular adenoma.  Recommended 7-year surveillance.   Past Medical History:  Diagnosis Date   Alcohol abuse    Hypertension    Stroke [facility])     Past Surgical History:  Procedure Laterality Date   COLONOSCOPY WITH PROPOFOL  N/A 08/08/2023   Procedure: COLONOSCOPY WITH PROPOFOL ;  Surgeon: Cindie Carlin POUR, DO;  [facility];  Service: Endoscopy;  Laterality: N/A;  830am, asa 3   POLYPECTOMY  08/08/2023   Procedure: POLYPECTOMY INTESTINAL;  Surgeon: Cindie Carlin POUR, DO;  [facility];  Service: Endoscopy;;    Current Outpatient Medications  Medication Sig Dispense Refill   acetaminophen  (TYLENOL ) 500 MG tablet Take 500 mg by mouth every 6 (six) hours as needed.      amLODipine  (NORVASC ) 5 MG tablet Take 1 tablet (5 mg total) by mouth daily. 30 tablet 0   aspirin  EC 81 MG tablet Take 81 mg by mouth daily.     atorvastatin (LIPITOR) 20 MG tablet Take 20 mg by mouth daily.     bisacodyl  5 MG EC tablet Take 2 tablets (10 mg total) by mouth as directed. 2 tablet 0   Cholecalciferol 25 MCG (1000 UT) TBDP Take 2,000 Units by mouth.     DOCUSATE SODIUM  PO Take 100 mg by mouth as needed.     memantine  (NAMENDA ) 10 MG tablet Take 1 tablet (10 mg total) by mouth 2 (two) times daily. 60 tablet 11   pantoprazole  (PROTONIX ) 40 MG tablet Take 1 tablet (40 mg total) by mouth daily at 6 (six) AM. 30 tablet 0   sertraline (ZOLOFT) 50 MG tablet Take 50 mg by mouth daily.     sodium phosphate (FLEET) ENEM Place 133 mLs (1 enema total) rectally as directed. 133 mL 0   traZODone (DESYREL) 150 MG tablet Take 150 mg by mouth at bedtime.     No current facility-administered medications for this visit.    Allergies as of 01/26/2024   (No Known Allergies)    History reviewed. No pertinent family history.  Social History   Socioeconomic History   Marital status: Single    Spouse name: Not on file   Number of children: Not on file   Years of education: Not  on file   Highest education level: Not on file  Occupational History   Not on file  Tobacco Use   Smoking status: Every Day    Types: Cigarettes   Smokeless tobacco: Never  Substance and Sexual Activity   Alcohol use: Not Currently    Comment: pt denies, EMS reports friends say he drinks regularly   Drug use: No   Sexual activity: Not on file  Other Topics Concern   Not on file  Social History Narrative   Not on file   Social Drivers of Health   Financial Resource Strain: Not on file  Food Insecurity: Not on file  Transportation Needs: Not on file  Physical Activity: Not on file  Stress: Not on file  Social Connections: Not on file    Review of Systems: Gen: Denies fever, chills, cold or flulike  symptoms, presyncope, syncope. CV: Denies chest pain, palpitations. Resp: Denies dyspnea, cough. GI: See HPI Heme: See HPI  Physical Exam: BP 136/87 (BP Location: Left Arm, Patient Position: Sitting, Cuff Size: Large)   Pulse 86   Temp 97.9 F (36.6 C) (Temporal)   Ht 6' 1 (1.854 m)   Wt 187 lb 9.6 oz (85.1 kg)   BMI 24.75 kg/m  General:   Alert and oriented. No distress noted. Pleasant and cooperative.  Head:  Normocephalic and atraumatic. Eyes:  Conjuctiva clear without scleral icterus. Heart:  S1, S2 present without murmurs appreciated. Lungs:  Clear to auscultation bilaterally. No wheezes, rales, or rhonchi. No distress.  Abdomen:  +BS, soft, non-tender and non-distended. No rebound or guarding. No HSM.  Soft umbilical hernia. Msk:  Symmetrical without gross deformities. Normal posture. Extremities:  Without edema. Neurologic:  Alert and  oriented x4 Psych:  Normal mood and affect.    Assessment:  68 year old male with history of alcohol abuse, stroke, hypertension, GERD, adenomatous colon polyp, presenting today at the request of Dr. Carlette for chronic hep C.  Chronic hepatitis C: Incidentally found on labs 09/25/2023 with HCVRNA 2,280,000.  Suspect this is a chronic infection as patient has been in a group home setting at least since 2019.  He does have history of intranasal drug use and is also received small tattoos while in jail which is likely where he contracted hep C.  Clinically, he is doing well without any symptoms of decompensated liver disease or any other significant GI symptoms.  LFTs and platelets within normal limits in March.  Hep B surface antigen negative.  No recent abdominal imaging.  We will plan to complete pretreatment evaluation, then submit for hep C treatment.  Would prefer Mavyret as patient is on daily PPI.   Plan:  CBC, CMP, INR, hepatitis A antibody total, hepatitis B surface antibody, but is B core antibody total, HIV antibody, Hep C genotype.   Abdominal ultrasound with elastography Pending review of labs and imaging, will plan to submit for Mavyret.   Josette Centers, PA-C [facility] 01/26/2024

## 2024-01-26 ENCOUNTER — Telehealth: Payer: Self-pay | Admitting: *Deleted

## 2024-01-26 ENCOUNTER — Ambulatory Visit (INDEPENDENT_AMBULATORY_CARE_PROVIDER_SITE_OTHER): Admitting: Gastroenterology

## 2024-01-26 ENCOUNTER — Encounter: Payer: Self-pay | Admitting: Gastroenterology

## 2024-01-26 VITALS — BP 136/87 | HR 86 | Temp 97.9°F | Ht 73.0 in | Wt 187.6 lb

## 2024-01-26 DIAGNOSIS — Z114 Encounter for screening for human immunodeficiency virus [HIV]: Secondary | ICD-10-CM

## 2024-01-26 DIAGNOSIS — Z7289 Other problems related to lifestyle: Secondary | ICD-10-CM

## 2024-01-26 DIAGNOSIS — B182 Chronic viral hepatitis C: Secondary | ICD-10-CM | POA: Diagnosis not present

## 2024-01-26 NOTE — Patient Instructions (Signed)
 We will arrange you to have an ultrasound of your liver at [facility].  You will also need to have blood work at [facility] when you have your ultrasound.  Once I have reviewed your ultrasound and blood work, we will plan to submit for hepatitis C treatment.  Josette Centers, PA-C [facility]

## 2024-01-26 NOTE — Telephone Encounter (Signed)
 Called [facility] and let them know about US  appt details.

## 2024-01-28 ENCOUNTER — Ambulatory Visit (HOSPITAL_COMMUNITY)
Admission: RE | Admit: 2024-01-28 | Discharge: 2024-01-28 | Disposition: A | Discharge status: Home / Self Care | Source: Ambulatory Visit | Attending: Gastroenterology | Admitting: Gastroenterology

## 2024-01-28 ENCOUNTER — Other Ambulatory Visit (HOSPITAL_COMMUNITY)
Admission: RE | Admit: 2024-01-28 | Discharge: 2024-01-28 | Disposition: A | Discharge status: Home / Self Care | Source: Ambulatory Visit | Attending: Gastroenterology | Admitting: Gastroenterology

## 2024-01-28 DIAGNOSIS — B182 Chronic viral hepatitis C: Secondary | ICD-10-CM | POA: Insufficient documentation

## 2024-01-28 LAB — COMPREHENSIVE METABOLIC PANEL WITH GFR
ALT: 33 U/L (ref 0–44)
AST: 28 U/L (ref 15–41)
Albumin: 3.9 g/dL (ref 3.5–5.0)
Alkaline Phosphatase: 95 U/L (ref 38–126)
Anion gap: 10 (ref 5–15)
BUN: 13 mg/dL (ref 8–23)
CO2: 22 mmol/L (ref 22–32)
Calcium: 9 mg/dL (ref 8.9–10.3)
Chloride: 104 mmol/L (ref 98–111)
Creatinine, Ser: 0.99 mg/dL (ref 0.61–1.24)
GFR, Estimated: >60 mL/min (ref >60)
Glucose, Bld: 116 mg/dL — ABNORMAL HIGH (ref 70–99)
Potassium: 3.9 mmol/L (ref 3.5–5.1)
Sodium: 136 mmol/L (ref 135–145)
Total Bilirubin: 0.7 mg/dL (ref 0.0–1.2)
Total Protein: 7.6 g/dL (ref 6.5–8.1)

## 2024-01-28 LAB — HEPATITIS A ANTIBODY, TOTAL: hep A Total Ab: REACTIVE — AB

## 2024-01-28 LAB — CBC
HCT: 45.4 % (ref 39.0–52.0)
Hemoglobin: 14.8 g/dL (ref 13.0–17.0)
MCH: 29 pg (ref 26.0–34.0)
MCHC: 32.6 g/dL (ref 30.0–36.0)
MCV: 88.8 fL (ref 80.0–100.0)
Platelets: 227 K/uL (ref 150–400)
RBC: 5.11 MIL/uL (ref 4.22–5.81)
RDW: 13.4 % (ref 11.5–15.5)
WBC: 9.6 K/uL (ref 4.0–10.5)
nRBC: 0 % (ref 0.0–0.2)

## 2024-01-28 LAB — PROTIME-INR
INR: 0.9 (ref 0.8–1.2)
Prothrombin Time: 13.1 s (ref 11.4–15.2)

## 2024-01-28 LAB — HEPATITIS B SURFACE ANTIBODY,QUALITATIVE: Hep B S Ab: NONREACTIVE

## 2024-01-29 LAB — HEPATITIS B CORE ANTIBODY, TOTAL: HEP B CORE AB: NEGATIVE

## 2024-01-30 ENCOUNTER — Ambulatory Visit: Payer: Self-pay | Admitting: Gastroenterology

## 2024-01-30 DIAGNOSIS — B182 Chronic viral hepatitis C: Secondary | ICD-10-CM

## 2024-02-01 LAB — HIV-1/HIV-2 QUAL RNA
HIV-1 RNA, Qualitative: NONREACTIVE
HIV-2 RNA, Qualitative: NONREACTIVE

## 2024-02-06 ENCOUNTER — Other Ambulatory Visit (HOSPITAL_COMMUNITY)
Admission: RE | Admit: 2024-02-06 | Discharge: 2024-02-06 | Disposition: A | Discharge status: Home / Self Care | Source: Ambulatory Visit | Attending: Gastroenterology | Admitting: Gastroenterology

## 2024-02-06 DIAGNOSIS — B182 Chronic viral hepatitis C: Secondary | ICD-10-CM | POA: Insufficient documentation

## 2024-02-08 LAB — HEPATITIS C GENOTYPE

## 2024-02-09 ENCOUNTER — Ambulatory Visit: Payer: Self-pay | Admitting: Gastroenterology

## 2024-02-09 DIAGNOSIS — B182 Chronic viral hepatitis C: Secondary | ICD-10-CM

## 2024-02-09 MED ORDER — MAVYRET 100-40 MG PO TABS: 3.0000 | ORAL_TABLET | Freq: Every day | ORAL | 1 refills | Status: DC

## 2024-02-11 MED ORDER — SOFOSBUVIR-VELPATASVIR 400-100 MG PO TABS: 1.0000 | ORAL_TABLET | Freq: Every day | ORAL | 2 refills | Status: AC

## 2024-02-12 ENCOUNTER — Telehealth: Payer: Self-pay | Admitting: *Deleted

## 2024-02-12 NOTE — Telephone Encounter (Signed)
 Pt was approved for Epclusa . Scanned approval letter in chart.

## 2024-02-13 NOTE — Telephone Encounter (Signed)
 Fantastic. Please let me know when medication arrives and I will provide instructions for medication as well as follow-up.

## 2024-02-16 NOTE — Telephone Encounter (Signed)
 Noted

## 2024-02-26 NOTE — Telephone Encounter (Signed)
 Courtney, any update on this?

## 2024-03-02 ENCOUNTER — Other Ambulatory Visit: Payer: Self-pay | Admitting: *Deleted

## 2024-03-02 DIAGNOSIS — B182 Chronic viral hepatitis C: Secondary | ICD-10-CM

## 2024-03-02 NOTE — Telephone Encounter (Addendum)
 Received notification from [place], [place], that patient's Epclusa  has been delivered to his group home and that staff had already given patient 2 doses without specific instructions from our office.  Courtney: Please provide group home with the instructions detailed below. It would be best if we could get them to sign these instructions.  We need to arrange for CBC, CMP, INR, HCV RNA in 4 weeks. These will need to be arranged under another providers name. Recommend arranging with whoever he ends up following up with in the office.  We also need to arrange for OV in 12 weeks.   Ladonna:  Patient needs OV in 12 weeks.    Epclusa  for Hepatitis C Instructions:  Take Epclusa , one tablet daily with food. You should take it at approximately the same time every day.  Do not miss a dose. You will complete 12 weeks of treatment.   Do not run out of Epclusa ! If you are down to one week of medication left and have not heard about your next shipment, please let us  know as soon as possible.   If you need to start a new medication, prescription from your doctor or over the counter medication, you need to contact us  to make sure it does not interfere with Epclusa . There are several medications that can interfere with Epclusa  and can make you sick or make the medication not work.  Recommend holding pantoprazole . If recurrent heartburn, ok to resume pantoprazole , but this must be taken no sooner than 4 hours after Epclusa  due to potential interactions.   Monitor for development of muscle aches/pains as concentration of atorvastatin can increase with Epclusa . As you are on a low dose, this should be tolerated well.   Tylenol  (acetominophen) and Advil (ibuprofen) are safe to take with Epclusa  if needed for headache, fever, pain.   You will have an appointment and blood work done after 4 weeks of treatment to make sure you are tolerating Epclusa  and to see if the medication is getting rid of the Hepatitis C.    DO NOT stop Epclusa  unless instructed to by your provider.  You will also have blood work done at the end of treatment and 12-24 weeks after end of treatment.        Patient signature: ____________________  Date: _______________

## 2024-03-03 NOTE — Telephone Encounter (Signed)
 Faxed instructions to [facility]. Miss Adam Koch is suppose to fax signed instructions back.

## 2024-03-08 NOTE — Telephone Encounter (Addendum)
 Ladonna, please arrange follow-up in 12 weeks with Charmaine Melia, NP.

## 2024-03-15 ENCOUNTER — Encounter: Payer: Self-pay | Admitting: Podiatry

## 2024-03-15 ENCOUNTER — Ambulatory Visit (INDEPENDENT_AMBULATORY_CARE_PROVIDER_SITE_OTHER): Admitting: Podiatry

## 2024-03-15 DIAGNOSIS — M79674 Pain in right toe(s): Secondary | ICD-10-CM

## 2024-03-15 DIAGNOSIS — M79675 Pain in left toe(s): Secondary | ICD-10-CM

## 2024-03-15 DIAGNOSIS — B351 Tinea unguium: Secondary | ICD-10-CM

## 2024-03-15 NOTE — Addendum Note (Signed)
 Addended by: WAYLAN ELIDIA PARAS on: 03/15/2024 11:45 AM   Modules accepted: Orders

## 2024-03-15 NOTE — Progress Notes (Signed)
  Subjective:  Patient ID: Adam Koch, male    DOB: [birth year],   MRN: [id number]  Chief Complaint  Patient presents with   Nail Problem    He was referred about his toenails.    68 y.o. male presents for concern of thickened elongated and painful nails that are difficult to trim. Requesting to have them trimmed today. Patient is not diabetic.   PCP:  Carlette Benita Area, MD    . Denies any other pedal complaints. Denies n/v/f/c.   Past Medical History:  Diagnosis Date   Alcohol abuse    Hypertension    Stroke [facility])     Objective:  Physical Exam: Vascular: DP/PT pulses 2/4 bilateral. CFT <3 seconds. Normal hair growth on digits. No edema.  Skin. No lacerations or abrasions bilateral feet. Nails 1-5 bilateral are thickened dystrophic and with subungual debris. Particularly the great toes.  Musculoskeletal: MMT 5/5 bilateral lower extremities in DF, PF, Inversion and Eversion. Deceased ROM in DF of ankle joint.  Neurological: Sensation intact to light touch.   Assessment:   1. Pain due to onychomycosis of toenails of both feet      Plan:  Patient was evaluated and treated and all questions answered. -Discussed and educated patient on  foot care, especially with  regards to the vascular, neurological and musculoskeletal systems.  -Discussed supportive shoes at all times and checking feet regularly.  -Mechanically debrided all nails 1-5 bilateral using sterile nail nipper and filed with dremel without incident   -Clinical picture and Fungal culture was obtained by removing a portion of the hard nail itself from each of the involved toenails using a sterile nail nipper and sent to [facility] lab. Patient tolerated the biopsy procedure well without discomfort or need for anesthesia.  -Discussed fungal nail treatment options including oral, topical, and laser treatments.  -Answered all patient questions -Patient to return  in 3 months for at risk foot care -Patient advised to  call the office if any problems or questions arise in the meantime.   Asberry Failing, DPM

## 2024-05-17 ENCOUNTER — Telehealth: Payer: Self-pay | Admitting: Lab

## 2024-05-17 NOTE — Telephone Encounter (Signed)
 Facility calling on fungus culture results states patient still having itching between toes that is really bothering him please review and advise.

## 2024-05-17 NOTE — Telephone Encounter (Signed)
 Spoke to Adam Koch have advised to schedule patient follow up appointment for results and treatment.

## 2024-05-31 ENCOUNTER — Encounter: Payer: Self-pay | Admitting: Podiatry

## 2024-05-31 ENCOUNTER — Ambulatory Visit: Admitting: Podiatry

## 2024-05-31 DIAGNOSIS — B351 Tinea unguium: Secondary | ICD-10-CM | POA: Diagnosis not present

## 2024-05-31 DIAGNOSIS — M79674 Pain in right toe(s): Secondary | ICD-10-CM | POA: Diagnosis not present

## 2024-05-31 DIAGNOSIS — M79675 Pain in left toe(s): Secondary | ICD-10-CM | POA: Diagnosis not present

## 2024-05-31 MED ORDER — TERBINAFINE HCL 250 MG PO TABS: 250.0000 mg | ORAL_TABLET | Freq: Every day | ORAL | 0 refills | Status: AC

## 2024-05-31 NOTE — Progress Notes (Unsigned)
 GI Office Note    Referring Provider: Carlette Benita Area* Primary Care Physician:  Fanta, Tesfaye Demissie, MD Primary Gastroenterologist: Carlin POUR. Cindie, DO  Date:  06/01/2024  ID:  Adam Koch, DOB 1955/11/25, MRN [id number]   Chief Complaint   Chief Complaint  Patient presents with   Follow-up    Follow up. No problems    History of Present Illness  Adam Koch is a 68 y.o. male with a history of alcohol abuse, stroke, hypertension, GERD, and adenomatous colon polyp presenting today for follow up of Hep C treatment.    Colonoscopy January 2025: - Non- bleeding internal hemorrhoids.  - One 7 mm polyp in the descending colon, removed with a cold snare.  - The examination was otherwise normal. - Pathology with tubular adenoma.   - Recommended 7-year surveillance.  Labs March 2025: Normal CMP and LFTs. HCV RNA 2280000, Hep B sAg negative.   Last office visit 01/26/2024.  Patient reported doing well.  It was discussed with him that he had hepatitis C and states he did not know he had this.  He did report a history of intranasal drug use in the past and received some small tattoos in jail which could have been likely where he contracted hepatitis C.  No signs of any decompensated liver disease or any other GI symptoms. Additional labs ordered including CBC, CMP, INR, complete viral hepatitis labs, hep c genotype, HIV, US  with elastography. Plan for mavyret  pending labs.   Labs July 2025: Normal LFTs, Cr. Normal CBC, INR normal. HIV negative. Immune Hep A. Not immune to Hep B - vaccination recommended.   Hep C genotype 1a. Mavyret  sent in but insurance denied and required Epclusa .   RUQ US  with elastography July 2025: - gallstones without wall thickening - CBD 4 mm - hepatic steatosis - normal portal flow - kPa 2.3 (IQR/median kPa ratio >3 = 0.39) - no cirrhosis  End of August 2025 he was started on the Epclusa  by care facility.   Today:  Discussed the use of  AI scribe software for clinical note transcription with the patient, who gave verbal consent to proceed.  Caregiver present with him today.  He is currently undergoing a 12-week treatment course for hepatitis C with Epclusa . There is uncertainty about whether he has missed any doses, and per his medication record from group home he received a dose this morning and has been documented a daily dose all this month. Started medication on 03/03/2024.  No muscle aches, abdominal pain, nausea, vomiting, changes in appetite, weight loss, constipation, diarrhea, chest pain, shortness of breath, melena, brbrpr, nausea, vomiting, or dysphagia.  He is unsure about the frequency of his bowel movements but denies any blood in his stool. Since discontinuing pantoprazole , he has experienced no reflux symptoms. No burning sensation in the chest or abdomen and no difficulty swallowing.      Wt Readings from Last 6 Encounters:  06/01/24 191 lb 6.4 oz (86.8 kg)  01/26/24 187 lb 9.6 oz (85.1 kg)  08/08/23 189 lb 2.5 oz (85.8 kg)  08/04/23 189 lb 2.5 oz (85.8 kg)  05/30/23 189 lb 3.2 oz (85.8 kg)  03/31/23 188 lb (85.3 kg)   Body mass index is 25.25 kg/m.  Current Outpatient Medications  Medication Sig Dispense Refill   acetaminophen  (TYLENOL ) 500 MG tablet Take 500 mg by mouth every 6 (six) hours as needed.     amLODipine  (NORVASC ) 5 MG tablet Take 1 tablet (5 mg total) by  mouth daily. 30 tablet 0   aspirin  EC 81 MG tablet Take 81 mg by mouth daily.     atorvastatin (LIPITOR) 20 MG tablet Take 20 mg by mouth daily.     bisacodyl  5 MG EC tablet Take 2 tablets (10 mg total) by mouth as directed. 2 tablet 0   Cholecalciferol 25 MCG (1000 UT) TBDP Take 2,000 Units by mouth.     DOCUSATE SODIUM  PO Take 100 mg by mouth as needed.     memantine  (NAMENDA ) 10 MG tablet Take 1 tablet (10 mg total) by mouth 2 (two) times daily. 60 tablet 11   pantoprazole  (PROTONIX ) 40 MG tablet Take 1 tablet (40 mg total) by mouth  daily at 6 (six) AM. 30 tablet 0   sertraline (ZOLOFT) 50 MG tablet Take 50 mg by mouth daily.     sodium phosphate (FLEET) ENEM Place 133 mLs (1 enema total) rectally as directed. 133 mL 0   Sofosbuvir -Velpatasvir  (EPCLUSA ) 400-100 MG TABS Take 1 tablet by mouth daily. 28 tablet 2   terbinafine  (LAMISIL ) 250 MG tablet Take 1 tablet (250 mg total) by mouth daily. 90 tablet 0   traZODone (DESYREL) 150 MG tablet Take 150 mg by mouth at bedtime.     No current facility-administered medications for this visit.    Past Medical History:  Diagnosis Date   Alcohol abuse    Hypertension    Stroke [facility])     Past Surgical History:  Procedure Laterality Date   COLONOSCOPY WITH PROPOFOL  N/A 08/08/2023   Procedure: COLONOSCOPY WITH PROPOFOL ;  Surgeon: Cindie Carlin POUR, DO;  [facility];  Service: Endoscopy;  Laterality: N/A;  830am, asa 3   POLYPECTOMY  08/08/2023   Procedure: POLYPECTOMY INTESTINAL;  Surgeon: Cindie Carlin POUR, DO;  [facility];  Service: Endoscopy;;    No family history on file.  Allergies as of 06/01/2024   (No Known Allergies)    Social History   Socioeconomic History   Marital status: Single    Spouse name: Not on file   Number of children: Not on file   Years of education: Not on file   Highest education level: Not on file  Occupational History   Not on file  Tobacco Use   Smoking status: Every Day    Types: Cigarettes   Smokeless tobacco: Never  Substance and Sexual Activity   Alcohol use: Not Currently    Comment: pt denies, EMS reports friends say he drinks regularly   Drug use: No   Sexual activity: Not on file  Other Topics Concern   Not on file  Social History Narrative   Not on file   Social Drivers of Health   Financial Resource Strain: Not on file  Food Insecurity: Not on file  Transportation Needs: Not on file  Physical Activity: Not on file  Stress: Not on file  Social Connections: Not on file    Review of  Systems   Gen: Denies fever, chills, anorexia. Denies fatigue, weakness, weight loss.  CV: Denies chest pain, palpitations, syncope, peripheral edema, and claudication. Resp: Denies dyspnea at rest, cough, wheezing, coughing up blood, and pleurisy. GI: See HPI Derm: Denies rash, itching, dry skin Psych: Denies depression, anxiety, memory loss, confusion. No homicidal or suicidal ideation.  Heme: Denies bruising, bleeding, and enlarged lymph nodes.  Physical Exam   BP 136/76 (BP Location: Right Arm, Patient Position: Sitting)   Pulse 87   Temp 98.8 F (37.1 C) (Temporal)  Wt 191 lb 6.4 oz (86.8 kg)   BMI 25.25 kg/m   General:   Alert and oriented. No distress noted. Pleasant and cooperative.  Head:  Normocephalic and atraumatic. Eyes:  Conjuctiva clear without scleral icterus. Mouth:  Oral mucosa pink and moist.  Poor dentition. No lesions. Abdomen:  +BS, soft, non-tender and non-distended. No rebound or guarding. No HSM or masses noted.  Soft small umbilical hernia. Rectal: Deferred Msk:  Symmetrical without gross deformities. Normal posture. Extremities:  Without edema. Neurologic:  Alert and  oriented x4 Psych:  Alert and cooperative. Normal mood and affect.  Assessment & Plan  Adam Koch is a 68 y.o. male presenting today for follow-up of hepatitis C    Chronic hepatitis C virus infection Diagnosed with hepatitis C in March 2025 incidentally.  Has been in group home since 2019 and this is likely a chronic infection possibly secondary to prior intranasal drug use or from tattoos received while in jail in the past.  Labs prior to initiation of treatment with stable LFTs and no evidence of cirrhosis on imaging. Currently on Epclusa  therapy, started on 03/03/2024 with expected to complete last week. No reported side effects such as myalgia, abdominal pain, nausea, or vomiting. Bowel movements are irregular, but no hematochezia. Appetite is stable. No chest pain or dyspnea. Unsure  if delays or issues with medication adherence. No signs of decompensated liver disease today on exam.  - Confirm remaining Epclusa  supply and timing of last dose - asked facility to call back to let us  know so we can determine timing of labs.  - Will order liver function tests and HCV RNA for 12 weeks post-treatment to assess for viral clearance.  History of gastroesophageal reflux disease Previously on pantoprazole  prior to initiating Epclusa  in August, currently not experiencing symptoms of reflux. No dysphagia or burning sensation in the chest or abdomen. - Monitor for symptoms of reflux and restart pantoprazole  if symptoms recur otherwise continue without.      Follow up   Follow up 6 months, recall if needed.   Adam Melia, MSN, FNP-BC, AGACNP-BC [facility]

## 2024-05-31 NOTE — Progress Notes (Signed)
  Subjective:  Patient ID: Adam Koch, male    DOB: 12/13/1955,   MRN: [id number]  Chief Complaint  Patient presents with   Nail Problem    This is a follow-up about his nails.  She took a nail sample. I think they said it may be fungus.  If it is, he needs a prescription for an antibiotic or something.    68 y.o. male presents for concern of thickened elongated and painful nails that are difficult to trim. Requesting to have them trimmed today. Patient is not diabetic. Here to review culture results.   PCP:  Carlette Benita Area, MD    . Denies any other pedal complaints. Denies n/v/f/c.   Past Medical History:  Diagnosis Date   Alcohol abuse    Hypertension    Stroke [facility])     Objective:  Physical Exam: Vascular: DP/PT pulses 2/4 bilateral. CFT <3 seconds. Normal hair growth on digits. No edema.  Skin. No lacerations or abrasions bilateral feet. Nails 1-5 bilateral are thickened dystrophic and with subungual debris. Particularly the great toes.  Musculoskeletal: MMT 5/5 bilateral lower extremities in DF, PF, Inversion and Eversion. Deceased ROM in DF of ankle joint.  Neurological: Sensation intact to light touch.   Assessment:   1. Pain due to onychomycosis of toenails of both feet       Plan:  Patient was evaluated and treated and all questions answered. -Discussed and educated patient on  foot care, especially with  regards to the vascular, neurological and musculoskeletal systems.  -Discussed supportive shoes at all times and checking feet regularly.  -Mechanically debrided all nails 1-5 bilateral using sterile nail nipper and filed with dremel without incident as courtesy   -Culture is positive for fungus. T rubrum.  -Discussed fungal nail treatment options including oral, topical, and laser treatments.  Patient would like to try lamisil . Sent to pharmacy x 90 days  -Answered all patient questions -Patient to return  in 3 months for at risk foot  care -Patient advised to call the office if any problems or questions arise in the meantime.   Adam Koch, DPM

## 2024-06-01 ENCOUNTER — Ambulatory Visit: Admitting: Gastroenterology

## 2024-06-01 ENCOUNTER — Encounter: Payer: Self-pay | Admitting: Gastroenterology

## 2024-06-01 VITALS — BP 136/76 | HR 87 | Temp 98.8°F | Wt 191.4 lb

## 2024-06-01 DIAGNOSIS — B182 Chronic viral hepatitis C: Secondary | ICD-10-CM

## 2024-06-01 DIAGNOSIS — Z8719 Personal history of other diseases of the digestive system: Secondary | ICD-10-CM

## 2024-06-01 NOTE — Patient Instructions (Signed)
 Please have facility call us  back and let us  know if he has completed Epclusa  or if he still receiving it.  He should only have received 12 weeks of therapy, this is not a long-term medication.  Please let us  know if there was any delays at all receiving medication previously and he went multiple days without medication and when he is scheduled to finish his course.  We need to do labs 12 weeks after he completes his Epclusa .  We need to know when he finishes it so we can make sure we order in the lab at the appropriate time.  Follow-up in 6 months if needed.  It was a pleasure to see you today. I want to create trusting relationships with patients. If you receive a survey regarding your visit,  I greatly appreciate you taking time to fill this out on paper or through your MyChart. I value your feedback.  Charmaine Melia, MSN, FNP-BC, AGACNP-BC [facility]

## 2024-06-14 ENCOUNTER — Ambulatory Visit: Admitting: Podiatry

## 2024-06-16 ENCOUNTER — Ambulatory Visit: Admitting: Podiatry

## 2024-08-12 ENCOUNTER — Other Ambulatory Visit: Payer: Self-pay | Admitting: *Deleted

## 2024-08-12 DIAGNOSIS — B182 Chronic viral hepatitis C: Secondary | ICD-10-CM

## 2024-09-08 ENCOUNTER — Ambulatory Visit: Admitting: Podiatry
